# Patient Record
Sex: Female | Born: 1935
Health system: Southern US, Community
[De-identification: ages and names within clinical notes are randomized; demographics above are authoritative.]

## PROBLEM LIST (undated history)

## (undated) DIAGNOSIS — IMO0002 Reserved for concepts with insufficient information to code with codable children: Secondary | ICD-10-CM

## (undated) DIAGNOSIS — S62101A Fracture of unspecified carpal bone, right wrist, initial encounter for closed fracture: Secondary | ICD-10-CM

## (undated) DIAGNOSIS — K219 Gastro-esophageal reflux disease without esophagitis: Secondary | ICD-10-CM

## (undated) DIAGNOSIS — K279 Peptic ulcer, site unspecified, unspecified as acute or chronic, without hemorrhage or perforation: Secondary | ICD-10-CM

## (undated) DIAGNOSIS — M545 Low back pain, unspecified: Secondary | ICD-10-CM

## (undated) DIAGNOSIS — G4700 Insomnia, unspecified: Secondary | ICD-10-CM

## (undated) DIAGNOSIS — I499 Cardiac arrhythmia, unspecified: Secondary | ICD-10-CM

## (undated) DIAGNOSIS — G8929 Other chronic pain: Secondary | ICD-10-CM

## (undated) DIAGNOSIS — R0781 Pleurodynia: Secondary | ICD-10-CM

## (undated) DIAGNOSIS — W19XXXA Unspecified fall, initial encounter: Secondary | ICD-10-CM

## (undated) DIAGNOSIS — T7840XA Allergy, unspecified, initial encounter: Secondary | ICD-10-CM

## (undated) DIAGNOSIS — K2970 Gastritis, unspecified, without bleeding: Secondary | ICD-10-CM

## (undated) HISTORY — DX: Gastritis, unspecified, without bleeding: K29.70

## (undated) HISTORY — DX: Insomnia, unspecified: G47.00

## (undated) HISTORY — DX: Other chronic pain: G89.29

## (undated) HISTORY — DX: Fracture of unspecified carpal bone, right wrist, initial encounter for closed fracture: S62.101A

## (undated) HISTORY — DX: Allergy, unspecified, initial encounter: T78.40XA

## (undated) HISTORY — PX: NO PAST SURGERIES: SHX2092

## (undated) HISTORY — DX: Peptic ulcer, site unspecified, unspecified as acute or chronic, without hemorrhage or perforation: K27.9

## (undated) HISTORY — DX: Low back pain: M54.5

## (undated) HISTORY — DX: Low back pain, unspecified: M54.50

## (undated) HISTORY — DX: Pleurodynia: R07.81

## (undated) HISTORY — DX: Gastro-esophageal reflux disease without esophagitis: K21.9

---

## 2009-11-16 ENCOUNTER — Encounter: Payer: Self-pay | Admitting: Internal Medicine

## 2009-11-16 ENCOUNTER — Ambulatory Visit: Payer: Self-pay | Admitting: Internal Medicine

## 2009-11-16 DIAGNOSIS — G4700 Insomnia, unspecified: Secondary | ICD-10-CM | POA: Insufficient documentation

## 2009-11-16 DIAGNOSIS — F1190 Opioid use, unspecified, uncomplicated: Secondary | ICD-10-CM

## 2009-11-16 DIAGNOSIS — M79609 Pain in unspecified limb: Secondary | ICD-10-CM

## 2009-11-16 DIAGNOSIS — Z8711 Personal history of peptic ulcer disease: Secondary | ICD-10-CM | POA: Insufficient documentation

## 2009-11-23 ENCOUNTER — Encounter: Payer: Self-pay | Admitting: Internal Medicine

## 2009-11-30 ENCOUNTER — Ambulatory Visit: Payer: Self-pay | Admitting: Internal Medicine

## 2009-11-30 DIAGNOSIS — M542 Cervicalgia: Secondary | ICD-10-CM

## 2010-02-11 ENCOUNTER — Telehealth: Payer: Self-pay | Admitting: Internal Medicine

## 2010-02-23 ENCOUNTER — Ambulatory Visit: Payer: Self-pay | Admitting: Internal Medicine

## 2010-02-23 ENCOUNTER — Encounter: Payer: Self-pay | Admitting: Gastroenterology

## 2010-02-23 DIAGNOSIS — R079 Chest pain, unspecified: Secondary | ICD-10-CM

## 2010-02-23 DIAGNOSIS — M810 Age-related osteoporosis without current pathological fracture: Secondary | ICD-10-CM

## 2010-02-23 LAB — CONVERTED CEMR LAB
ALT: 12 units/L (ref 0–35)
AST: 16 units/L (ref 0–37)
Alkaline Phosphatase: 50 units/L (ref 39–117)
Basophils Absolute: 0 10*3/uL (ref 0.0–0.1)
Basophils Relative: 1 % (ref 0–1)
Creatinine, Ser: 0.88 mg/dL (ref 0.40–1.20)
Eosinophils Absolute: 0.1 10*3/uL (ref 0.0–0.7)
Eosinophils Relative: 2 % (ref 0–5)
HCT: 35.3 % — ABNORMAL LOW (ref 36.0–46.0)
Lymphocytes Relative: 43 % (ref 12–46)
MCHC: 32.6 g/dL (ref 30.0–36.0)
MCV: 91.7 fL (ref >=78.0)
Monocytes Absolute: 0.3 10*3/uL (ref 0.1–1.0)
Platelets: 213 10*3/uL (ref 150–400)
RDW: 14.2 % (ref 11.5–15.5)
Sodium: 143 meq/L (ref 135–145)
Total Bilirubin: 0.3 mg/dL (ref 0.3–1.2)
Total Protein: 7 g/dL (ref 6.0–8.3)

## 2010-04-09 ENCOUNTER — Ambulatory Visit: Payer: Self-pay | Admitting: Gastroenterology

## 2010-04-09 ENCOUNTER — Encounter (INDEPENDENT_AMBULATORY_CARE_PROVIDER_SITE_OTHER): Payer: Self-pay | Admitting: *Deleted

## 2010-04-09 DIAGNOSIS — K2970 Gastritis, unspecified, without bleeding: Secondary | ICD-10-CM

## 2010-05-18 NOTE — Assessment & Plan Note (Signed)
Vital Signs:  Patient profile:   75 year old female Height:      62 inches (157.48 cm) Weight:      111.0 pounds (50.45 kg) BMI:     20.38 Temp:     98.6 degrees F (37.00 degrees C) oral Pulse rate:   67 / minute BP sitting:   138 / 78  (right arm)  Vitals Entered By: Cynda Familia Duncan Dull) (November 16, 2009 8:49 AM) CC: bilateral leg pain mostly on right, mostly during the night/early morining off and on x 5-6 years, cant sleep it she doesnt take the amitriptyline 25mg , had stomach ulcers  Is Patient Diabetic? No Pain Assessment Patient in pain? no      Nutritional Status BMI of 25 - 29 = overweight  Have you ever been in a relationship where you felt threatened, hurt or afraid?Unable to ask   Does patient need assistance? Functional Status Self care Ambulation Normal   CC:  bilateral leg pain mostly on right, mostly during the night/early morining off and on x 5-6 years, cant sleep it she doesnt take the amitriptyline 25mg , and had stomach ulcers .  History of Present Illness: 75 yo female w/o significant PMH presents with a 5-6 yr history of chronic back pain that radiates down to her legs and independent bilateral legs pain that is worst on the right than left.  She describes the leg pain as tight without any radiation, numbness or tingling and it is worst in early morning which is relieved by Excedrine.  She has been taking 1-2 Excedrine in the past 5-6 years daily and was told by a physician in [place] that she has stomach ulcer. Patient does have occasional acid reflux after meals but currently does not have any symptoms and  is not taking any medication for that.  Patient has been taking Amytriptyline 25mg  1 tablet at bedtime for insomnia and appetite stimulant.    PSH: none FH: Not known SH: no alcohol, smoke, illicit drug MEDs: Excedrine, Amitriptyline 25mg    Preventive Screening-Counseling & Management  Alcohol-Tobacco     Smoking Status: never  Family  History: Not known  Social History: Smoking Status:  never  Review of Systems  The patient denies anorexia, fever, weight loss, weight gain, vision loss, decreased hearing, hoarseness, chest pain, syncope, dyspnea on exertion, peripheral edema, prolonged cough, headaches, hemoptysis, abdominal pain, melena, hematochezia, severe indigestion/heartburn, hematuria, incontinence, genital sores, muscle weakness, suspicious skin lesions, transient blindness, difficulty walking, depression, unusual weight change, abnormal bleeding, enlarged lymph nodes, angioedema, breast masses, and testicular masses.    Physical Exam  General:  alert, well-developed, well-nourished, and well-hydrated.   Head:  normocephalic and atraumatic.   Eyes:  vision grossly intact, pupils equal, pupils round, and pupils reactive to light.   Ears:  R ear normal, L ear normal, and no external deformities.   Nose:  no external deformity, no external erythema, and no nasal discharge.   Mouth:  good dentition, no gingival abnormalities, and pharynx pink and moist.   Neck:  supple, full ROM, and no masses.   Chest Wall:  no deformities, no tenderness, and no mass.   Breasts:  skin/areolae normal and no masses.   Lungs:  normal respiratory effort, no intercostal retractions, no accessory muscle use, normal breath sounds, no crackles, and no wheezes.   Heart:  normal rate, regular rhythm, no murmur, no gallop, no rub, and no JVD.   Abdomen:  soft, non-tender, normal bowel sounds, no  distention, no masses, and no rebound tenderness.   Msk:  normal ROM, no joint tenderness, no joint swelling, no joint warmth, no redness over joints, no joint instability, and no crepitation.  Negative straight leg raising test bilaterally   Pulses:  R radial normal.   Extremities:  no edema,clubbing,cyanosis Neurologic:  alert & oriented X3, cranial nerves II-XII intact, strength normal in all extremities, sensation intact to light touch, sensation  intact to pinprick, gait normal, and DTRs symmetrical and normal.   Skin:  turgor normal, color normal, no rashes, no ecchymoses, no petechiae, no purpura, no ulcerations, and no edema.   Cervical Nodes:  no anterior cervical adenopathy and no posterior cervical adenopathy.   Psych:  Oriented X3, memory intact for recent and remote, normally interactive, good eye contact, not anxious appearing, and not depressed appearing.     Impression & Recommendations:  Problem # 1:  LEG PAIN, BILATERAL (ICD-729.5) Assessment Deteriorated Pain and tightness is progressively getting worst per patient.  Patient has been taking daily Excedrine for pain relieve.  This could be due to muscle spasm.  Radiculopathy is less likely as she has a negative straight leg raising test bilaterally.  I will give her a trial of muscle relaxant-Flexeril 10mg  by mouth  daily for symptoms relief and stop Excedrine.  Problem # 2:  LOW BACK PAIN, CHRONIC (ICD-724.2) Assessment: Deteriorated  Patient has been seen at [facility] and we do not have any medical records.  Will send request for medical records and hold Xray of lumbar spine today until we get records from [facility].  Will try Flexeril 10mg  for symptoms relief.  Her updated medication list for this problem includes:    Cyclobenzaprine Hcl 10 Mg Tabs (Cyclobenzaprine hcl) .Marland Kitchen... 1/2 to 1 tablet at night for muscle spasm  Problem # 3:  INSOMNIA (ICD-780.52) Assessment: Unchanged Patient has a hard time falling asleep and requires Amitriptyline 25mg  at bedtime. Will refill Amitriptyline 25mg .      Problem # 4:  PERSONAL HISTORY OF PEPTIC ULCER DISEASE (ICD-V12.71) Assessment: Unchanged  Counsel on stopping Excedrine as it can worsen her ulcer.   Start Omeprozole 20mg  by mouth daily  Her updated medication list for this problem includes:    Omeprazole 20 Mg Cpdr (Omeprazole) .Marland Kitchen... 1 tablet by mouth daily  Complete Medication List: 1)   Cyclobenzaprine Hcl 10 Mg Tabs (Cyclobenzaprine hcl) .... 1/2 to 1 tablet at night for muscle spasm 2)  Omeprazole 20 Mg Cpdr (Omeprazole) .Marland Kitchen.. 1 tablet by mouth daily 3)  Amitriptyline Hcl 25 Mg Tabs (Amitriptyline hcl) .Marland Kitchen.. 1 tablet by mouth at bedtime  Patient Instructions: 1)  Follow-up appointment in 2-3 weeks with interpreter for full H&P 2)  1. Stop taking Excedrine 3)  2. Start Omeprazole for stomach ulcer 4)  3. Start Flexeril 10mg  1/2-1 tablet daily for leg pain/muscle spasm Prescriptions: AMITRIPTYLINE HCL 25 MG TABS (AMITRIPTYLINE HCL) 1 tablet by mouth at bedtime  #30 x 3   Entered and Authorized by:   Rosana Berger MD   Signed by:   Rosana Berger MD on 11/16/2009   Method used:   Print then Give to Patient   RxID:   [id number] OMEPRAZOLE 20 MG CPDR (OMEPRAZOLE) 1 tablet by mouth daily  #30 x 3   Entered and Authorized by:   Rosana Berger MD   Signed by:   Rosana Berger MD on 11/16/2009   Method used:   Print then Give to Patient   RxID:  [id number] CYCLOBENZAPRINE HCL 10 MG TABS (CYCLOBENZAPRINE HCL) 1/2 to 1 tablet at night for muscle spasm  #30 x 3   Entered by:   Zoila Shutter MD   Authorized by:   Rosana Berger MD   Signed by:   Rosana Berger MD on 11/16/2009   Method used:   Print then Give to Patient   RxID:   [id number]    Prevention & Chronic Care Immunizations   Influenza vaccine: Not documented    Tetanus booster: Not documented    Pneumococcal vaccine: Not documented    H. zoster vaccine: Not documented  Colorectal Screening   Hemoccult: Not documented    Colonoscopy: Not documented  Other Screening   Pap smear: Not documented    Mammogram: Not documented    DXA bone density scan: Not documented   Smoking status: never  (11/16/2009)  Lipids   Total Cholesterol: Not documented   LDL: Not documented   LDL Direct: Not documented   HDL: Not documented   Triglycerides: Not documented

## 2010-05-18 NOTE — Assessment & Plan Note (Signed)
Summary: EST-2 WEEK F/U VISIT/CH   Vital Signs:  Patient profile:   75 year old female Height:      62 inches (157.48 cm) Weight:      107.3 pounds (48.77 kg) BMI:     19.70 Temp:     97.8 degrees F oral Pulse rate:   77 / minute BP sitting:   113 / 71  (left arm)  Vitals Entered By: Chinita Pester RN (November 30, 2009 8:50 AM) CC: 2 week f/u. Left neck muscle stiffness. Pain med. helps leg pain. Lefgt ankle bruise. Is Patient Diabetic? No Pain Assessment Patient in pain? yes     Location: lower back Intensity: 8 Type: aching Onset of pain  Intermittent Nutritional Status BMI of 19 -24 = normal  Have you ever been in a relationship where you felt threatened, hurt or afraid?Unable to ask; daughter w/pt.   Does patient need assistance? Functional Status Self care Ambulation Normal Comments Translator w/pt.   Primary Care Provider:  Rosana Berger MD  CC:  2 week f/u. Left neck muscle stiffness. Pain med. helps leg pain. Lefgt ankle bruise.Marland Kitchen  History of Present Illness: 75 yo female presents for 2 week follow up.  She states that the Flexeril helps with her right leg pain.  She still takes Excedrine occasionally only when she takes a walk.   She also complains of bruising on her hand and left ankle, she does not have a history of easy bruising and did not know how she got those bruises.  She has left stiffness on her neck.  She has a burning sensation in her stomach around 5am.   Severe chronic back pain for years and has not had any Xrays or imaginngs.  She describes her pain as dull, stiff, 8/10, not moving around alleviates her pain and moving around makes it worse.   She went to chiropractor before but it did not help.   PMH:  stomach ulcer [id number] XB:[id number] Social:lives with her daughter, denies any alcohol, smoking, illicit drugs     Depression History:      The patient denies a depressed mood most of the day and a diminished interest in her usual daily  activities.         Preventive Screening-Counseling & Management  Alcohol-Tobacco     Alcohol drinks/day: 0     Smoking Status: never  Physical Exam  General:  alert, well-developed, well-nourished, and well-hydrated.   Neck:  tender to palpation on left side but no rigidity Lungs:  normal respiratory effort, no intercostal retractions, no accessory muscle use, normal breath sounds, no dullness, no crackles, and no wheezes.   Heart:  normal rate, regular rhythm, no murmur, no gallop, no rub, and no JVD.   Abdomen:  soft, non-tender, normal bowel sounds, and no distention.   Msk:  normal ROM, no joint tenderness, no joint swelling, no joint warmth, no redness over joints, and no joint deformities.   Extremities:  no edema, + bruises on left ankle and wrist Neurologic:  alert & oriented X3 and cranial nerves II-XII intact.     Impression & Recommendations:  Problem # 1:  LOW BACK PAIN, CHRONIC (ICD-724.2) She states that the pain has been chronic in the past 5-6 years and Excedrine and Aleve helps; however, she has a history of gastric ulcer.   She never had any imaging before. On physical examination, she does have mild kyphosis but no tenderness to palpation.   I would like to send  her for Xray of lumbar spine once she has her orange card.   I told her to stop taking Excedrine and she can start Tramadol 50mg  1 tablet every 6-8 hours as needed for pain  Her updated medication list for this problem includes:    Cyclobenzaprine Hcl 10 Mg Tabs (Cyclobenzaprine hcl) .Marland Kitchen... 1/2 to 1 tablet at night for muscle spasm    Tramadol Hcl 50 Mg Tabs (Tramadol hcl) .Marland Kitchen... 1 tablet by mouth 6-8 hours as needed for pain  Problem # 2:  PERSONAL HISTORY OF PEPTIC ULCER DISEASE (ICD-V12.71) Assessment: Unchanged Patient had and EGD in [place] last year and was found to have gastric ulcer (per patient).  She still has burning sensation in early morning.  I will increase her Omeprazole 40mg  daily. I  explained to her the risk of taking Excedrine given that she had/has gastric ulcer.  I told her to stop taking Excedrine.   Once she gets her orange card, I will send her to GI to make sure that her ulcer(s) has healed.    Problem # 3:  INSOMNIA (ICD-780.52) Assessment: Improved Will continue Amitriptyline because this is helping her sleep at night as well as increase her appetite  Problem # 4:  LEG PAIN, BILATERAL (ICD-729.5) Assessment: Improved Due to muscle spasm.  Improving with Flexeril.  WIll continue medication  Problem # 5:  NECK PAIN, LEFT (ICD-723.1) Assessment: New She has tenderness to palpation on left side of neck but no rigidity.  Most likely muscle spasm.   I will continue Flexeril 10mg .  Her updated medication list for this problem includes:    Cyclobenzaprine Hcl 10 Mg Tabs (Cyclobenzaprine hcl) .Marland Kitchen... 1/2 to 1 tablet at night for muscle spasm    Tramadol Hcl 50 Mg Tabs (Tramadol hcl) .Marland Kitchen... 1 tablet by mouth 6-8 hours as needed for pain  Complete Medication List: 1)  Cyclobenzaprine Hcl 10 Mg Tabs (Cyclobenzaprine hcl) .... 1/2 to 1 tablet at night for muscle spasm 2)  Omeprazole 40 Mg Cpdr (omeprazole)  .Marland Kitchen.. 1 tablet by mouth daily 3)  Amitriptyline Hcl 25 Mg Tabs (Amitriptyline hcl) .Marland Kitchen.. 1 tablet by mouth at bedtime 4)  Tramadol Hcl 50 Mg Tabs (Tramadol hcl) .Marland Kitchen.. 1 tablet by mouth 6-8 hours as needed for pain  Patient Instructions: 1)  1. DO NOT take any excedrine because that can worsen your stomach ulcer 2)  2. Take Tramadol 50 mg 1 tablet every 6-8 hours as needed for back pain 3)  3. Increase your Omeprazole to 40mg  1 tablet daily 4)  4. Continue taking Flexeril 10mg  1/2  to 1 tablet daily for leg and neck pain 5)  5. Follow up in 3-4 months with Dr. Anselm Jungling Prescriptions: OMEPRAZOLE 40 MG CPDR (OMEPRAZOLE) 1 tablet by mouth daily  #30 x 3   Entered and Authorized by:   Rosana Berger MD   Signed by:   Rosana Berger MD on 11/30/2009   Method used:   Handwritten    RxID:   [id number] TRAMADOL HCL 50 MG TABS (TRAMADOL HCL) 1 tablet by mouth 6-8 hours as needed for pain  #90 x 1   Entered and Authorized by:   Rosana Berger MD   Signed by:   Rosana Berger MD on 11/30/2009   Method used:   Handwritten   RxID:   [id number]    Prevention & Chronic Care Immunizations   Influenza vaccine: Not documented   Influenza vaccine due: 12/17/2009    Tetanus booster: Not documented  Tetanus booster due: 11/30/2009    Pneumococcal vaccine: Not documented    H. zoster vaccine: Not documented    Immunization comments: Had Pneumovax vaccination 5 years ago  Colorectal Screening   Hemoccult: Not documented    Colonoscopy: Not documented  Other Screening   Pap smear: Not documented   Pap smear due: 12/01/2010    Mammogram: Not documented   Mammogram due: 12/01/2010    DXA bone density scan: Not documented   DXA scan due: 12/01/2010    Smoking status: never  (11/30/2009)    Screening comments: She had it all done in [place] last year.  Never had a colonoscopy before  Lipids   Total Cholesterol: Not documented   LDL: Not documented   LDL Direct: Not documented   HDL: Not documented   Triglycerides: Not documented   Appended Document: EST-2 WEEK F/U VISIT/CH Office visit level of service was 5 because of length of time taken for her mutilple complaints and all of her history was obtained through an interpreter and her daughter.

## 2010-05-18 NOTE — Miscellaneous (Signed)
Summary: PATIENT CONSENT FORM  PATIENT CONSENT FORM   Imported By: Louretta Parma 11/18/2009 15:57:52  _____________________________________________________________________  External Attachment:    Type:   Image     Comment:   External Document

## 2010-05-18 NOTE — Progress Notes (Signed)
Summary: medication/gp  Phone Note Call from Patient   Caller: Daughter Summary of Call: Pt.'s daughter called and stated Flexeril 10mg  is not helping with the muscle spasm.  She wants to know if you could  increase the strength or change to something else. Uses [facility] on [address]. Thanks Initial call taken by: Chinita Pester RN,  February 11, 2010 9:35 AM  Follow-up for Phone Call        daughter called again Follow-up by: Marin Roberts RN,  February 11, 2010 5:10 PM  Additional Follow-up for Phone Call Additional follow up Details #1::        Is pt taking tramadol as well to help with pain?  May increase to three times a day as needed spasm. However, she should be aware that this may cause sedation and avoid using that frequently if so.  Pt should probably be re-evaluated if that's the case. Additional Follow-up by: Mariea Stable MD,  February 12, 2010 10:19 AM    Additional Follow-up for Phone Call Additional follow up Details #2::    I talked to pt.'s daughter;informed her the pt. may take Flexeril up to 3 times a day per Dr. Onalee Hua. She stated she understood; said she might try 1/2 tablet  Follow-up by: Chinita Pester RN,  February 12, 2010 2:04 PM

## 2010-05-18 NOTE — Miscellaneous (Signed)
Summary: RELEASE OF INFORMATION FORM  RELEASE OF INFORMATION FORM   Imported By: Louretta Parma 11/26/2009 16:41:16  _____________________________________________________________________  External Attachment:    Type:   Image     Comment:   External Document

## 2010-05-18 NOTE — Assessment & Plan Note (Signed)
Summary: LEG PAIN BODY PAIN/ NEED MEDICATION FOR PAIN/SB.   Vital Signs:  Patient profile:   75 year old female Height:      62 inches (157.48 cm) Weight:      111.9 pounds (50.86 kg) BMI:     20.54 Temp:     97.4 degrees F (36.33 degrees C) oral Pulse rate:   77 / minute BP sitting:   130 / 75  (right arm)  Vitals Entered By: Stanton Kidney Ditzler RN (February 23, 2010 9:00 AM) Is Patient Diabetic? No Pain Assessment Patient in pain? yes     Location: back, ribs and stomach Intensity: 8-9 Type: sharp Onset of pain  long time Nutritional Status BMI of 19 -24 = normal Nutritional Status Detail appetite ok  Have you ever been in a relationship where you felt threatened, hurt or afraid?denies   Does patient need assistance? Functional Status Self care Ambulation Normal Comments Daughter and Lyda Kalata interpreter with pt. Pain occ right and left rib cage area for over 1 year. FU right calf spasms - better with med - area becomes cold with spasms - no discoloration.   Primary Care Provider:  Rosana Berger MD   History of Present Illness: 75 y/o [place] woman with PMH significant for chronic back pain for 5-6 years, leg cramps for 1-2 years , personal h/o gastric ulcer comes to the clinic for worsening leg cramps.  She describes the leg cramps as they would occur mostly at nights, sometimes in so much pain that she has to take excedrine (though it is not recommended).She says that flexeril at the dose she was (1/2 to 1 tab was not helping her). She could not tell the exact duration for how long do they last.   She also complains of rib pains that has been ongoing for 1-2 years , worse recently. The pain is located on both sides. She describes the pain as sharp, worse being 8/10.She has this pain about 3- 4 times in a week,lasting for few hours and gets better with pain meds.She also denies c/p, SOB,fever, N/V/D.  She also complains of epigastric pain that is described as reflux, gets better  with eating but omeprazole is not helping her.  The daughter also got some medicines that were given by a physician from [place] that included risedronate, showing that she has some osteoporosis. She also mentioned that her mother is also taking calcium and vit D supplements.   Depression History:      The patient denies a depressed mood most of the day and a diminished interest in her usual daily activities.         Preventive Screening-Counseling & Management  Alcohol-Tobacco     Alcohol drinks/day: 0     Smoking Status: never  Current Medications (verified): 1)  Cyclobenzaprine Hcl 10 Mg Tabs (Cyclobenzaprine Hcl) .... Take 1/2 Tablet Three Times A Day 2)  Omeprazole 40 Mg Cpdr (Omeprazole) .Marland Kitchen.. 1 Tablet By Mouth Daily 3)  Amitriptyline Hcl 25 Mg Tabs (Amitriptyline Hcl) .Marland Kitchen.. 1 Tablet By Mouth At Bedtime 4)  Tramadol Hcl 50 Mg Tabs (Tramadol Hcl) .Marland Kitchen.. 1 Tablet By Mouth 6-8 Hours As Needed For Pain  Allergies (verified): No Known Drug Allergies  Review of Systems      See HPI  The patient denies anorexia, fever, vision loss, decreased hearing, chest pain, dyspnea on exertion, and prolonged cough.    Physical Exam  General:  alert, well-developed, well-nourished, and well-hydrated.   Head:  normocephalic and  atraumatic.   Eyes:  vision grossly intact, pupils equal, pupils round, and pupils reactive to light.   Mouth:  pharynx pink and moist.   Neck:  supple, full ROM, and no masses.   Lungs:  normal respiratory effort, no intercostal retractions, no accessory muscle use, and normal breath sounds.   Abdomen:  soft, non-tender, normal bowel sounds, no distention, no masses, no guarding, no rigidity, and no rebound tenderness.   Msk:  normal ROM, no joint tenderness, no joint swelling, no joint warmth, no redness over joints, and no joint deformities.  SLRT was negative. Pulses:  2+pulses b/l. Extremities:  no clubbing or cyanosis. Neurologic:  alert & oriented X3, cranial nerves  II-XII intact, strength normal in all extremities, sensation intact to light touch, and DTRs symmetrical and normal.     Impression & Recommendations:  Problem # 1:  PERSONAL HISTORY OF PEPTIC ULCER DISEASE (ICD-V12.71) Assessment Unchanged She reports that she continues to have epigastric pain without any relief from omeprazole. As per the patient's daughter she had an EGD about 2 years ago in [place] when they went for a visit, and she was diagnosed with ulcer. No records are available. She was advised to get an EGD here and referral was made. Will follow up with referral. Orders: Gastroenterology Referral (GI)  Problem # 2:  LEG PAIN, BILATERAL (ICD-729.5) Assessment: Unchanged Most likely she has nocturnal leg cramps, etiology not known. Other possiblities like hypokalemia vs hypocalcemia were considered and we ordered CMP for that. She was advised to avoid lot of leg movements while sleeping especially twisting her toes, was adviced some stretching exercises and keeping the bed end loose and not tucked up. Her flexeril dose was also increased to 1/2 tab three times a day that would also help her alleviating symptoms to some extent.  Orders: T-Comprehensive Metabolic Panel [id number] T-CBC w/Diff ([id number])  Problem # 3:  LOW BACK PAIN, CHRONIC (ICD-724.2) Assessment: Unchanged Back pain is controlled with the current pain regimen.Will continue the same. Given a chronic history we decided not to get a lumbar spine x-ray during this visit but if the pain gets worse we can consider that during later visits.  Her updated medication list for this problem includes:    Cyclobenzaprine Hcl 10 Mg Tabs (Cyclobenzaprine hcl) .Marland Kitchen... Take 1/2 tablet three times a day    Tramadol Hcl 50 Mg Tabs (Tramadol hcl) .Marland Kitchen... 1 tablet by mouth 6-8 hours as needed for pain  Problem # 4:  RIB PAIN (ICD-786.50) Assessment: New She reports lower chest pain that is loacted b/l around the lower ribs, occurs  about 3-4 times in a week, gets relieved with pain meds. But she denies any trauma,new onset cough, worsening of chest pain with breathing, fever, malaise.There is no tenderness to palpation. Differentials include muscle spasms vs trauma(no h/o) vs pleuritic vs metastatic.This is most likely muscle spasms , absence of other symptoms like weight loss,fever, SOB makes metastatic and pleuritic very less likey.Would treat with tramadol.  Complete Medication List: 1)  Cyclobenzaprine Hcl 10 Mg Tabs (Cyclobenzaprine hcl) .... Take 1/2 tablet three times a day 2)  Omeprazole 40 Mg Cpdr (omeprazole)  .Marland Kitchen.. 1 tablet by mouth daily 3)  Amitriptyline Hcl 25 Mg Tabs (Amitriptyline hcl) .Marland Kitchen.. 1 tablet by mouth at bedtime 4)  Tramadol Hcl 50 Mg Tabs (Tramadol hcl) .Marland Kitchen.. 1 tablet by mouth 6-8 hours as needed for pain  Patient Instructions: 1)  Please take the medicines as prescribed. 2)  Please schedule a  follow-up appointment in 2 months. Prescriptions: TRAMADOL HCL 50 MG TABS (TRAMADOL HCL) 1 tablet by mouth 6-8 hours as needed for pain  #90 x 1   Entered and Authorized by:   Elyse Jarvis   Signed by:   Elyse Jarvis on 02/23/2010   Method used:   Print then Give to Patient   RxID:   [id number] AMITRIPTYLINE HCL 25 MG TABS (AMITRIPTYLINE HCL) 1 tablet by mouth at bedtime  #30 x 3   Entered and Authorized by:   Elyse Jarvis   Signed by:   Elyse Jarvis on 02/23/2010   Method used:   Print then Give to Patient   RxID:   [id number] OMEPRAZOLE 40 MG CPDR (OMEPRAZOLE) 1 tablet by mouth daily  #30 x 3   Entered and Authorized by:   Elyse Jarvis   Signed by:   Elyse Jarvis on 02/23/2010   Method used:   Print then Give to Patient   RxID:   [id number] CYCLOBENZAPRINE HCL 10 MG TABS (CYCLOBENZAPRINE HCL) take 1/2 tablet three times a day  #90 x 1   Entered and Authorized by:   Elyse Jarvis   Signed by:   Elyse Jarvis on 02/23/2010   Method used:   Print then Give to Patient   RxID:    [id number]    Orders Added: 1)  Est. Patient Level V [63875] 2)  T-Comprehensive Metabolic Panel [[id number]] 3)  T-CBC w/Diff [[id number]] 4)  Gastroenterology Referral [GI]    Prevention & Chronic Care Immunizations   Influenza vaccine: Not documented   Influenza vaccine due: 12/17/2009    Tetanus booster: Not documented   Td booster deferral: Deferred  (02/23/2010)   Tetanus booster due: 11/30/2009    Pneumococcal vaccine: Not documented   Pneumococcal vaccine deferral: Deferred  (02/23/2010)    H. zoster vaccine: Not documented  Colorectal Screening   Hemoccult: Not documented    Colonoscopy: Not documented  Other Screening   Pap smear: Not documented   Pap smear due: 12/01/2010    Mammogram: Not documented   Mammogram due: 12/01/2010    DXA bone density scan: Not documented   DXA scan due: 12/01/2010    Smoking status: never  (02/23/2010)  Lipids   Total Cholesterol: Not documented   LDL: Not documented   LDL Direct: Not documented   HDL: Not documented   Triglycerides: Not documented      Resource handout printed.  Process Orders Check Orders Results:     [facility]: ABN not required for this insurance Tests Sent for requisitioning (February 25, 2010 8:38 AM):     02/23/2010: [facility] -- T-Comprehensive Metabolic Panel [[id number]] (signed)     02/23/2010: [facility] -- [facility] w/Diff [[id number]] (signed)

## 2010-05-18 NOTE — Letter (Signed)
Summary: [facility]  [facility]   Imported By: Louretta Parma 11/30/2009 12:26:55  _____________________________________________________________________  External Attachment:    Type:   Image     Comment:   External Document

## 2010-05-18 NOTE — Letter (Signed)
Summary: New Patient letter  [facility]  [address] [place], [place] [address]   Phone: [phone]  Fax: [phone]       02/23/2010 MRN: [id number]  [facility] [place] [address] [place], [place]  [address]  Dear Ms. Allison Rojas,  Welcome to the Gastroenterology Division at [facility].    You are scheduled to see Dr.  [name] on 04-09-10 at 2:30pm on the 3rd floor at [facility], [address]  We ask that you try to arrive at our office 15 minutes prior to your appointment time to allow for check-in.  We would like you to complete the enclosed self-administered evaluation form prior to your visit and bring it with you on the day of your appointment.  We will review it with you.  Also, please bring a complete list of all your medications or, if you prefer, bring the medication bottles and we will list them.  Please bring your insurance card so that we may make a copy of it.  If your insurance requires a referral to see a specialist, please bring your referral form from your primary care physician.  Co-payments are due at the time of your visit and may be paid by cash, check or credit card.     Your office visit will consist of a consult with your physician (includes a physical exam), any laboratory testing he/she may order, scheduling of any necessary diagnostic testing (e.g. x-ray, ultrasound, CT-scan), and scheduling of a procedure (e.g. Endoscopy, Colonoscopy) if required.  Please allow enough time on your schedule to allow for any/all of these possibilities.    If you cannot keep your appointment, please call [phone] to cancel or reschedule prior to your appointment date.  This allows [place] the opportunity to schedule an appointment for another patient in need of care.  If you do not cancel or reschedule by 5 p.m. the business day prior to your appointment date, you will be charged a $50.00 late cancellation/no-show fee.    Thank you for choosing [facility] for your medical needs.  We appreciate the opportunity to care for you.  Please visit [place] at our website  to learn more about our practice.                     Sincerely,                                                             The Gastroenterology Division

## 2010-05-20 NOTE — Letter (Signed)
Summary: EGD Instructions  [facility]  [address] [place], [place] [address]   Phone: [phone]  Fax: [phone]       Allison Rojas    1935-12-08    MRN: [id number]       Procedure Day /Date:05/25/10 TUE     Arrival Time: 8 am     Procedure Time:9 am     Location of Procedure:                    [facility] (4th Floor)    PREPARATION FOR ENDOSCOPY   On 05/25/10 THE DAY OF THE PROCEDURE:  1.   No solid foods, milk or milk products are allowed after midnight the night before your procedure.  2.   Do not drink anything colored red or purple.  Avoid juices with pulp.  No orange juice.  3.  You may drink clear liquids until 7 am , which is 2 hours before your procedure.                                                                                                CLEAR LIQUIDS INCLUDE: Water Jello Ice Popsicles Tea (sugar ok, no milk/cream) Powdered fruit flavored drinks Coffee (sugar ok, no milk/cream) Gatorade Juice: apple, white grape, white cranberry  Lemonade Clear bullion, consomm, broth Carbonated beverages (any kind) Strained chicken noodle soup Hard Candy   MEDICATION INSTRUCTIONS  Unless otherwise instructed, you should take regular prescription medications with a small sip of water as early as possible the morning of your procedure.               OTHER INSTRUCTIONS  You will need a responsible adult at least 73 years of age to accompany you and drive you home.   This person must remain in the waiting room during your procedure.  Wear loose fitting clothing that is easily removed.  Leave jewelry and other valuables at home.  However, you may wish to bring a book to read or an iPod/MP3 player to listen to music as you wait for your procedure to start.  Remove all body piercing jewelry and leave at home.  Total time from sign-in until discharge is approximately 2-3 hours.  You should go home directly after your  procedure and rest.  You can resume normal activities the day after your procedure.  The day of your procedure you should not:   Drive   Make legal decisions   Operate machinery   Drink alcohol   Return to work  You will receive specific instructions about eating, activities and medications before you leave.    The above instructions have been reviewed and explained to me by   _______________________    I fully understand and can verbalize these instructions _____________________________ Date _________

## 2010-05-20 NOTE — Assessment & Plan Note (Signed)
History of Present Illness Visit Type: Initial Consult Primary GI MD: Rob Bunting MD Primary Otniel Hoe: Rosana Berger MD Requesting Taggert Bozzi: Leonette Most Rodgers,MD Chief Complaint: Screening for ENDO History of Present Illness:     very pleasant 75 year old woman who speaks no [place]. She was born in [place]. She was here with her daughter and her granddaughter who help as translators.  she was recommended to have an EGD by a doctor.  She had an EGD 1.5 years ago in [place] (showed "chronic atrophic gastritis and an ulcer" per patients family).  Has been on PPI for 4 months.  She was having dyspepsia.  Also when brushing her teeth she noticed coughin up mucous.  She still has pains in the morning usually.  Acid tast in mouth.  No vomiting.  FAmily thinks she has lost weight, but not clear how much.  She takes omeprazole two pills, usually before breakfast.  she had a CBC about a month ago, her hemoglobin was 11.8.           Current Medications (verified): 1)  Cyclobenzaprine Hcl 10 Mg Tabs (Cyclobenzaprine Hcl) .... Take 1/2 Tablet Three Times A Day 2)  Omeprazole 40 Mg Cpdr (Omeprazole) .Marland Kitchen.. 1 Tablet By Mouth Daily 3)  Amitriptyline Hcl 25 Mg Tabs (Amitriptyline Hcl) .Marland Kitchen.. 1 Tablet By Mouth At Bedtime 4)  Tramadol Hcl 50 Mg Tabs (Tramadol Hcl) .Marland Kitchen.. 1 Tablet By Mouth 6-8 Hours As Needed For Pain  Allergies (verified): No Known Drug Allergies  Past History:  Past Medical History: gastric ulcer while in [place]  Past Surgical History: none  Family History: Not known  Social History: she is widowed, she has 2 children, she does not smoke cigarettes or drink alcohol.  Review of Systems       Pertinent positive and negative review of systems were noted in the above HPI and GI specific review of systems.  All other review of systems was otherwise negative.   Vital Signs:  Patient profile:   75 year old female Height:      62 inches Weight:      113 pounds BMI:      20.74 BSA:     1.50 Pulse rate:   88 / minute Pulse rhythm:   regular BP sitting:   82 / 52  (left arm)  Vitals Entered By: Merri Ray CMA Duncan Dull) (April 09, 2010 2:09 PM)  Physical Exam  Additional Exam:  Constitutional: generally well appearing Psychiatric: alert and oriented times 3 Eyes: extraocular movements intact Mouth: oropharynx moist, no lesions Neck: supple, no lymphadenopathy Cardiovascular: heart regular rate and rythm Lungs: CTA bilaterally Abdomen: soft, non-tender, non-distended, no obvious ascites, no peritoneal signs, normal bowel sounds Extremities: no lower extremity edema bilaterally Skin: no lesions on visible extremities    Impression & Recommendations:  Problem # 1:  Gastritis, history of gastric ulcer there is a significant language barrier here even with her translators. She was taking a lot of Excedrin up until about 4 months ago. She had some type of gastric ulcer noted a year and a half ago in [place]. I think we should at least follow this up with an EGD to check for ulcer healing. Biopsy her for H. pylori. The meantime she will stay on omeprazole daily. I've advised her to stay away from NSAIDs.  Patient Instructions: 1)  You will be scheduled to have an upper endoscopy. 2)  Try not to take NSAIDs. 3)  Tylenol is safe for your stomach. 4)  The  medication list was reviewed and reconciled.  All changed / newly prescribed medications were explained.  A complete medication list was provided to the patient / caregiver.  Appended Document: Orders Update/EGD interpretor scheduled   Clinical Lists Changes  Problems: Added new problem of GASTRITIS (ICD-535.50) Orders: Added new Test order of EGD (EGD) - Signed

## 2010-05-25 ENCOUNTER — Ambulatory Visit (INDEPENDENT_AMBULATORY_CARE_PROVIDER_SITE_OTHER): Payer: Self-pay | Admitting: Ophthalmology

## 2010-05-25 ENCOUNTER — Encounter: Payer: Self-pay | Admitting: Ophthalmology

## 2010-05-25 ENCOUNTER — Ambulatory Visit (AMBULATORY_SURGERY_CENTER): Payer: Self-pay | Admitting: Gastroenterology

## 2010-05-25 ENCOUNTER — Other Ambulatory Visit: Payer: Self-pay | Admitting: Internal Medicine

## 2010-05-25 ENCOUNTER — Other Ambulatory Visit: Payer: Self-pay

## 2010-05-25 ENCOUNTER — Other Ambulatory Visit: Payer: Self-pay | Admitting: *Deleted

## 2010-05-25 ENCOUNTER — Other Ambulatory Visit: Payer: Self-pay | Admitting: Gastroenterology

## 2010-05-25 DIAGNOSIS — M545 Low back pain, unspecified: Secondary | ICD-10-CM

## 2010-05-25 DIAGNOSIS — Z8711 Personal history of peptic ulcer disease: Secondary | ICD-10-CM

## 2010-05-25 DIAGNOSIS — K2990 Gastroduodenitis, unspecified, without bleeding: Secondary | ICD-10-CM

## 2010-05-25 DIAGNOSIS — Z9889 Other specified postprocedural states: Secondary | ICD-10-CM

## 2010-05-25 DIAGNOSIS — D649 Anemia, unspecified: Secondary | ICD-10-CM

## 2010-05-25 DIAGNOSIS — K2970 Gastritis, unspecified, without bleeding: Secondary | ICD-10-CM

## 2010-05-25 DIAGNOSIS — I499 Cardiac arrhythmia, unspecified: Secondary | ICD-10-CM

## 2010-05-25 DIAGNOSIS — Z0000 Encounter for general adult medical examination without abnormal findings: Secondary | ICD-10-CM | POA: Insufficient documentation

## 2010-05-25 DIAGNOSIS — E041 Nontoxic single thyroid nodule: Secondary | ICD-10-CM

## 2010-05-25 DIAGNOSIS — R928 Other abnormal and inconclusive findings on diagnostic imaging of breast: Secondary | ICD-10-CM

## 2010-05-25 DIAGNOSIS — D509 Iron deficiency anemia, unspecified: Secondary | ICD-10-CM | POA: Insufficient documentation

## 2010-05-25 DIAGNOSIS — R921 Mammographic calcification found on diagnostic imaging of breast: Secondary | ICD-10-CM | POA: Insufficient documentation

## 2010-05-25 DIAGNOSIS — M810 Age-related osteoporosis without current pathological fracture: Secondary | ICD-10-CM

## 2010-05-25 LAB — CBC
HCT: 34.8 % — ABNORMAL LOW (ref 36.0–46.0)
Hemoglobin: 11.4 g/dL — ABNORMAL LOW (ref 12.0–15.0)
MCH: 29.2 pg (ref 26.0–34.0)
MCHC: 32.8 g/dL (ref 30.0–36.0)
MCV: 89.2 fL (ref 78.0–100.0)
Platelets: 213 10*3/uL (ref 150–400)
RBC: 3.9 MIL/uL (ref 3.87–5.11)
RDW: 13.2 % (ref 11.5–15.5)
WBC: 5.2 10*3/uL (ref 4.0–10.5)

## 2010-05-25 LAB — FERRITIN: Ferritin: 127 ng/mL (ref 10–291)

## 2010-05-25 LAB — IRON AND TIBC: Iron: 103 ug/dL (ref 42–145)

## 2010-05-25 MED ORDER — TRAMADOL HCL 50 MG PO TABS: 50.0000 mg | ORAL_TABLET | Freq: Four times a day (QID) | ORAL | Status: DC | PRN

## 2010-05-25 MED ORDER — AMITRIPTYLINE HCL 25 MG PO TABS: 25.0000 mg | ORAL_TABLET | Freq: Every day | ORAL | Status: DC

## 2010-05-25 MED ORDER — CYCLOBENZAPRINE HCL 10 MG PO TABS: 5.0000 mg | ORAL_TABLET | Freq: Three times a day (TID) | ORAL | Status: DC

## 2010-05-25 MED ORDER — OMEPRAZOLE 40 MG PO CPDR: 40.0000 mg | DELAYED_RELEASE_CAPSULE | Freq: Every day | ORAL | Status: DC

## 2010-05-25 MED ORDER — TRAMADOL HCL 50 MG PO TABS
50.0000 mg | ORAL_TABLET | Freq: Four times a day (QID) | ORAL | Status: DC | PRN
Start: 2010-05-25 — End: 2010-12-23

## 2010-05-25 NOTE — Assessment & Plan Note (Addendum)
The patient's heart rate was reportedly irregular on the EKG performed in [place] back in June of 2010. I'm concerned about possible paroxysmal atrial fibrillation. The patient was not started on any medications at that time. The patient's current cardiac exam demonstrates regular rate and rhythm without any murmurs. But I will repeat an EKG for baseline purposes now.  The EKG today demonstrated normal sinus rhythm, with a heart rate of 69 and a normal axis.

## 2010-05-25 NOTE — Telephone Encounter (Signed)
Refill approved - nurse to phone in.

## 2010-05-25 NOTE — Progress Notes (Signed)
  Subjective:    Patient ID: Allison Rojas, female    DOB: 1935-11-02, 75 y.o.   MRN: [id number]  HPI  This is a 75 year old female with a past medical history significant for chronic back pain, osteoporosis and insomnia who presents for routine followup. The patient was last seen in our clinic on February 23, 2010. At that time, the patient continued to complain of epigastric pain and was referred for EGD, as she had no relief from her PPI. The endoscopy performed by Dr. [name] on February 7 demonstrated moderate gastritis and biopsies were taken to check for H. Pylori. Since the patient's last visit, she's continued to have mild to moderate epigastric abdominal pain. This is stable and unchanged. She's now been on a PPI since November of last year with some improvement since her initial presentation. The patient denies any nausea vomiting hematemesis change in her bowel movements or blood in her stool. The patient continues to have lumbar back pain, but this is chronic in nature and unchanged. There currently are  no warning signs for infection or malignancy. The patient brought in her medical records from [place], and the patient reportedly had a thyroid ultrasound performed which demonstrated a colloid cyst which needs repeat scanning now. The patient also had a DEXA scan performed which demonstrated a T score of -2.8. The patient is currently taking calcium and vitamin D for this. The patient's interpreter also states that she had a mammogram performed in June 2010 which demonstrated calcification of a blood vessel with no recommendation on followup and no BI-RADS level. EKG at the time reportedly demonstrated an arrhythmia but there is no record of that type and the patient was not started on any treatment. The patient has no other complaints.  Review of Systems  Constitutional: Negative for fever and chills.  Respiratory: Negative for cough and shortness of breath.   Cardiovascular: Negative for chest  pain and palpitations.  Gastrointestinal: Negative for vomiting, diarrhea and constipation.       Objective:   Physical Exam  Constitutional: She appears well-developed and well-nourished.  HENT:  Head: Normocephalic and atraumatic.  Eyes: Pupils are equal, round, and reactive to light.  Cardiovascular: Normal rate, regular rhythm and intact distal pulses.  Exam reveals no gallop and no friction rub.   No murmur heard. Pulmonary/Chest: Effort normal and breath sounds normal. She has no wheezes. She has no rales.  Abdominal: Soft. Bowel sounds are normal. She exhibits no distension. There is no tenderness.       There is mild tenderness over the right lower two ribs.   Musculoskeletal: Normal range of motion.  Neurological: She is alert. No cranial nerve deficit.  Skin: No rash noted.          Assessment & Plan:

## 2010-05-25 NOTE — Assessment & Plan Note (Signed)
The patient is currently taking over-the-counter calcium and vitamin D. I would not recommend a bisphosphonate at this time because of the patient's gastritis. This however could be considered in the future given the patient's T score on DEXA scan.

## 2010-05-25 NOTE — Assessment & Plan Note (Signed)
The patient's hemoglobin was 11.5 in November of 2011.  This was normocytic. I will recheck both his CBC and an anemia panel today to further work this out, though it's possible that this could be secondary the patient's moderate gastritis. Currently the patient denies any melena, black stools, or blood in stool.

## 2010-05-25 NOTE — Assessment & Plan Note (Signed)
The patient reportedly had a colloid cyst in her thyroid in June 2010. They recommended 6 month followup at that time. As such I will repeat a thyroid ultrasound now, for further evaluation.

## 2010-05-25 NOTE — Assessment & Plan Note (Signed)
This is stable and chronic, and the patient denies any recent fevers or chills. The pain is unchanged and is improved by her tramadol. I will continue the patient on this medication for now but lumbar x-ray could be considered in the future if the patient's pain continues.

## 2010-05-25 NOTE — Assessment & Plan Note (Signed)
The patient reportedly had calcifications on her mammogram performed in June 2010 but no BI-RADS level was assigned. I will repeat her mammogram now for followup of this lesion.

## 2010-05-25 NOTE — Patient Instructions (Addendum)
I am referring you for an thyroid ultrasound, a colonoscopy, and a mammogram today.  Please have the studies done.  You EKG looks normal.  I would like to see you back in 1-2 months to follow up on your abdominal pain.

## 2010-05-25 NOTE — Telephone Encounter (Signed)
Last refill 03/09/2010

## 2010-05-25 NOTE — Assessment & Plan Note (Signed)
EGD today demonstrated moderate gastritis and biopsies were taken. I will plan to treat the patient accordingly if H. pylori is found. For now continue conservative measurements with tramadol. This appears to be improving mildly but the patient's epigastric pain continues to be present. I have the patient followup in one month to ensure improvement of her symptoms.

## 2010-05-25 NOTE — Assessment & Plan Note (Signed)
Given the patient's anemia, and the fact that she is due for colonoscopy, I will refer the patient for screening colonoscopy at this time. The patient is not aware of any abnormal colonoscopies in the past.

## 2010-05-25 NOTE — Telephone Encounter (Signed)
Refill faxed in.  

## 2010-05-26 ENCOUNTER — Encounter: Payer: Self-pay | Admitting: Internal Medicine

## 2010-05-31 ENCOUNTER — Encounter: Payer: Self-pay | Admitting: Gastroenterology

## 2010-05-31 ENCOUNTER — Other Ambulatory Visit: Payer: Self-pay | Admitting: Internal Medicine

## 2010-05-31 DIAGNOSIS — Z1231 Encounter for screening mammogram for malignant neoplasm of breast: Secondary | ICD-10-CM

## 2010-06-08 ENCOUNTER — Ambulatory Visit (HOSPITAL_COMMUNITY)
Admission: RE | Admit: 2010-06-08 | Discharge: 2010-06-08 | Disposition: A | Discharge status: Home / Self Care | Payer: Self-pay | Source: Ambulatory Visit | Attending: Internal Medicine | Admitting: Internal Medicine

## 2010-06-08 ENCOUNTER — Encounter: Payer: Self-pay | Admitting: Ophthalmology

## 2010-06-08 DIAGNOSIS — E041 Nontoxic single thyroid nodule: Secondary | ICD-10-CM

## 2010-06-08 DIAGNOSIS — E049 Nontoxic goiter, unspecified: Secondary | ICD-10-CM | POA: Insufficient documentation

## 2010-06-08 NOTE — Progress Notes (Signed)
Quick Note:  It would appear that the results of the patient's thyroid ultrasound are benign, and I do not feel that FNA is needed it at this time. I would recommend checking a TSH and free T4 the patient's next visit , and an uptake scan could be considered in the future. At this point however, I would only recommend serial monitoring of the lesions. ______

## 2010-06-09 NOTE — Letter (Signed)
Summary: Results Letter  [facility]  [address] [place], [place] [address]   Phone: [phone]  Fax: [phone]        May 31, 2010 MRN: [id number]    Allison Rojas [address] [place], [place]  [address]    Dear [name],   The biopsies taken during your recent EGD showed no infection or cancer.  You should continue to follow the recommendations that we discussed at the time of your procedure (written on your EGD procedure report).  Please feel free to call if you have any further questions or concerns.       Sincerely,  Rachael Fee MD  This letter has been electronically signed by your physician.  Appended Document: Results Letter Letter Mailed

## 2010-06-09 NOTE — Procedures (Addendum)
Summary: Endoscopy   EGD  Procedure date:  05/25/2010  Findings:      Location: [facility]   ENDOSCOPY PROCEDURE REPORT  PATIENT:  Allison Rojas, Allison Rojas  MR#:  [id number] BIRTHDATE:   08-20-1935, 74 yrs. old   GENDER:   female ENDOSCOPIST:   Rachael Fee, MD Referred by: Marin Roberts, M.D. PROCEDURE DATE:  05/25/2010 PROCEDURE:  EGD with biopsy, [address] ASA CLASS:   Class II INDICATIONS: h/o gastric ulcer, gastritis (in [place]) MEDICATIONS:   Fentanyl 25 mcg IV, Versed 4 mg IV TOPICAL ANESTHETIC:   Exactacain Spray  DESCRIPTION OF PROCEDURE:   After the risks benefits and alternatives of the procedure were thoroughly explained, informed consent was obtained.  The LB [id number]-H180 [id number] endoscope was introduced through the mouth and advanced to the second portion of the duodenum, without limitations.  The instrument was slowly withdrawn as the mucosa was fully examined. <<PROCEDUREIMAGES>>        <<OLD IMAGES>> There was moderate, atrophic appearing gastritis. Most prominant distally. Biopsies taken and sent to pathology (jar 1) (see image1).  Otherwise the examination was normal (see image3, image4, image5, and image2).    Retroflexed views revealed no abnormalities.    The scope was then withdrawn from the patient and the procedure completed. COMPLICATIONS:   None  ENDOSCOPIC IMPRESSION:  1) Moderate gastritis, biopsied to check for H. pylori  2) Otherwise normal examination  RECOMMENDATIONS:  If biopsies show H. pylori, you will be started on the appropriate antibiotics.  Continue to avoid NSAID type meds.  _______________________________ Rachael Fee, MD

## 2010-06-21 ENCOUNTER — Ambulatory Visit (HOSPITAL_COMMUNITY)
Admission: RE | Admit: 2010-06-21 | Discharge: 2010-06-21 | Disposition: A | Discharge status: Home / Self Care | Payer: Self-pay | Source: Ambulatory Visit | Attending: Internal Medicine | Admitting: Internal Medicine

## 2010-06-21 DIAGNOSIS — Z1231 Encounter for screening mammogram for malignant neoplasm of breast: Secondary | ICD-10-CM

## 2010-06-23 ENCOUNTER — Other Ambulatory Visit: Payer: Self-pay | Admitting: Internal Medicine

## 2010-06-23 DIAGNOSIS — R928 Other abnormal and inconclusive findings on diagnostic imaging of breast: Secondary | ICD-10-CM

## 2010-06-30 ENCOUNTER — Ambulatory Visit
Admission: RE | Admit: 2010-06-30 | Discharge: 2010-06-30 | Disposition: A | Discharge status: Home / Self Care | Payer: Self-pay | Source: Ambulatory Visit | Attending: Internal Medicine | Admitting: Internal Medicine

## 2010-06-30 DIAGNOSIS — R928 Other abnormal and inconclusive findings on diagnostic imaging of breast: Secondary | ICD-10-CM

## 2010-07-06 ENCOUNTER — Encounter: Payer: Self-pay | Admitting: Internal Medicine

## 2010-09-07 ENCOUNTER — Encounter: Payer: Self-pay | Admitting: Internal Medicine

## 2010-12-01 ENCOUNTER — Other Ambulatory Visit: Payer: Self-pay | Admitting: *Deleted

## 2010-12-06 MED ORDER — OMEPRAZOLE 40 MG PO CPDR: 40.0000 mg | DELAYED_RELEASE_CAPSULE | Freq: Every day | ORAL | Status: DC

## 2010-12-06 NOTE — Telephone Encounter (Signed)
Pt seen Feb and requested 1-2 month F/U. Cancelled / no showed next two appts. Sent to front desk to make appt

## 2010-12-07 NOTE — Telephone Encounter (Signed)
Refill faxed to [facility]. 

## 2010-12-23 ENCOUNTER — Encounter: Payer: Self-pay | Admitting: Internal Medicine

## 2010-12-23 ENCOUNTER — Ambulatory Visit (INDEPENDENT_AMBULATORY_CARE_PROVIDER_SITE_OTHER): Payer: Self-pay | Admitting: Internal Medicine

## 2010-12-23 DIAGNOSIS — M79605 Pain in left leg: Secondary | ICD-10-CM

## 2010-12-23 DIAGNOSIS — M79609 Pain in unspecified limb: Secondary | ICD-10-CM

## 2010-12-23 DIAGNOSIS — K219 Gastro-esophageal reflux disease without esophagitis: Secondary | ICD-10-CM

## 2010-12-23 DIAGNOSIS — R05 Cough: Secondary | ICD-10-CM

## 2010-12-23 MED ORDER — OMEPRAZOLE 20 MG PO CPDR: 40.0000 mg | DELAYED_RELEASE_CAPSULE | Freq: Every day | ORAL | Status: DC

## 2010-12-23 MED ORDER — TRAMADOL HCL 50 MG PO TABS: 50.0000 mg | ORAL_TABLET | Freq: Four times a day (QID) | ORAL | Status: DC | PRN

## 2010-12-23 MED ORDER — CYCLOBENZAPRINE HCL 10 MG PO TABS: 5.0000 mg | ORAL_TABLET | Freq: Three times a day (TID) | ORAL | Status: DC

## 2010-12-23 MED ORDER — OMEPRAZOLE 40 MG PO CPDR: 40.0000 mg | DELAYED_RELEASE_CAPSULE | Freq: Every day | ORAL | Status: DC

## 2010-12-23 MED ORDER — AMITRIPTYLINE HCL 25 MG PO TABS: 25.0000 mg | ORAL_TABLET | Freq: Every day | ORAL | Status: DC

## 2010-12-23 MED ORDER — HYDROCODONE-HOMATROPINE 5-1.5 MG/5ML PO SYRP: 2.5000 mL | ORAL_SOLUTION | Freq: Four times a day (QID) | ORAL | Status: AC | PRN

## 2010-12-23 NOTE — Progress Notes (Signed)
  Subjective:    Patient ID: Allison Rojas, female    DOB: 12/01/1935, 75 y.o.   MRN: [id number]  HPI [name] is a pleasant 75 year old [place] lady who comes to the clinic with complaint of acute worsening of chronic cough for last 3 days. She is accompanied by her daughter who speaks some English and also has an interpreter to help the encounter. Her daughter says that she's having chronic cough for more than 10 years now. She migrated to [place] from [place] about 10 years before and she is to have dry chronic cough even in [place]. But for past 3 days, her cough is getting worse and she is having intermittent severe coughing spells - few of which are observed during the encounter in which she was having severe dry cough and her face turned red due to congestion.  Her cough is more severe during last 3 nights and she is not able to have any sleep due to continuous coughing-though she also coughs all the time during the day for past 3 days. She does not bring out any significant sputum with cough- little clear phlegm once in a while. No blood or yellow green sputum. Although her physical exam did not show any swollen nasal turbinates, erythema or discharge in throat or mouth, wheezing or other adventitious sounds on lung exam.  She does have intermittent postnasal drip, is being treated with Prilosec 40 mg daily for GERD, and is on Allegra and other over-the-counter allergy medications for recurrent allergies. She does not have any fever, chills, shortness of breath, headache, sinus congestion, vision changes, abdominal pain, nausea, vomiting.      Review of Systems    As per history of present illness, all other systems reviewed and negative. Objective:   Physical Exam Constitutional: Vital signs reviewed.  Patient is thin,  in no acute distress- except intermittent coughing spells- and cooperative with exam. Alert and oriented x3.  Head: Normocephalic and atraumatic Mouth: no erythema or exudates,  MMM, no swollen nasal turbinates. Eyes: PERRL, EOMI, conjunctivae normal, No scleral icterus.  Neck: Supple, Trachea midline normal ROM, No JVD Cardiovascular: RRR, S1 normal, S2 normal, no MRG, pulses symmetric and intact bilaterally Pulmonary/Chest: CTAB, no wheezes, rales, or rhonchi Abdominal: Soft. Non-tender, non-distended, bowel sounds are normal, no masses, organomegaly, or guarding present.  Musculoskeletal: No joint deformities, erythema, or stiffness, ROM full and no nontender Neurological: A&O x3, Strenght is normal and symmetric bilaterally, cranial nerve II-XII are grossly intact, no focal motor deficit, sensory intact to light touch bilaterally.  Skin: Warm, dry and intact. No rash, cyanosis, or clubbing.          Assessment & Plan:

## 2010-12-23 NOTE — Assessment & Plan Note (Addendum)
As described in history of present illness, this seems to be acute exacerbation of her chronic cough. She has perennial sinus allergy and also GERD- both of which are presumably treated. She does not have any diagnosis of asthma and is not wheezing at present. So 2 out of 3 most common causes of chronic cough namely postnasal drip and GERD are being symptomatically treated while she doesn't have the third one namely asthma. Also she has never smoked cigarettes and also does not drink alcohol which might worsen her reflux disease. This acute exacerbation seems to be due to some inciting event which irritated her throat and made her have few coughing spells which started the vicious cycle of cough which aggravated the irritation which aggravated to cough. I would like to try to stop the cycle by giving cough suppressant medication. I will prescribe Hycodan syrup 5 ml every 4 hours as needed for symptomatically if which should probably have the patient for her cough. Explained her daughter about the possible diagnosis and she was understanding. She has an appointment with her primary care at the end of this month and they wish to keep it. Explained them to call the clinic and make an early appointment if cough does not improve after a week of symptomatic care or if she develops fever, purulent sputum, significant shortness of breath.

## 2010-12-23 NOTE — Patient Instructions (Signed)
Please make followup appointment with primary care doctor in 4-5 months. Please cancel the appointment at the end of this month. If her cough doesn't get better after a week of taking the syrup, or she develops fever, chills, green or yellow sputum, shortness of breath, call the clinic and make an appointment. Take the cough syrup-Hycodan-5 mL every 6 hours as needed for cough suppression. She does not seem to have any infection as the cause of her cough.

## 2011-01-11 ENCOUNTER — Ambulatory Visit (INDEPENDENT_AMBULATORY_CARE_PROVIDER_SITE_OTHER): Payer: Self-pay | Admitting: Internal Medicine

## 2011-01-11 ENCOUNTER — Ambulatory Visit (HOSPITAL_COMMUNITY)
Admission: RE | Admit: 2011-01-11 | Discharge: 2011-01-11 | Disposition: A | Discharge status: Home / Self Care | Payer: Self-pay | Source: Ambulatory Visit | Attending: Internal Medicine | Admitting: Internal Medicine

## 2011-01-11 ENCOUNTER — Encounter: Payer: Self-pay | Admitting: Internal Medicine

## 2011-01-11 VITALS — BP 133/78 | HR 82 | Temp 97.8°F | Wt 110.0 lb

## 2011-01-11 DIAGNOSIS — M810 Age-related osteoporosis without current pathological fracture: Secondary | ICD-10-CM

## 2011-01-11 DIAGNOSIS — K2990 Gastroduodenitis, unspecified, without bleeding: Secondary | ICD-10-CM

## 2011-01-11 DIAGNOSIS — M79609 Pain in unspecified limb: Secondary | ICD-10-CM

## 2011-01-11 DIAGNOSIS — M545 Low back pain, unspecified: Secondary | ICD-10-CM

## 2011-01-11 DIAGNOSIS — Z780 Asymptomatic menopausal state: Secondary | ICD-10-CM

## 2011-01-11 DIAGNOSIS — K219 Gastro-esophageal reflux disease without esophagitis: Secondary | ICD-10-CM

## 2011-01-11 DIAGNOSIS — M4120 Other idiopathic scoliosis, site unspecified: Secondary | ICD-10-CM | POA: Insufficient documentation

## 2011-01-11 DIAGNOSIS — K2970 Gastritis, unspecified, without bleeding: Secondary | ICD-10-CM

## 2011-01-11 DIAGNOSIS — R05 Cough: Secondary | ICD-10-CM

## 2011-01-11 DIAGNOSIS — M8448XA Pathological fracture, other site, initial encounter for fracture: Secondary | ICD-10-CM | POA: Insufficient documentation

## 2011-01-11 DIAGNOSIS — Z23 Encounter for immunization: Secondary | ICD-10-CM

## 2011-01-11 DIAGNOSIS — Z13220 Encounter for screening for lipoid disorders: Secondary | ICD-10-CM

## 2011-01-11 DIAGNOSIS — R053 Chronic cough: Secondary | ICD-10-CM

## 2011-01-11 LAB — LIPID PANEL
Cholesterol: 203 mg/dL — ABNORMAL HIGH (ref 0–200)
Triglycerides: 221 mg/dL — ABNORMAL HIGH (ref <150)
VLDL: 44 mg/dL — ABNORMAL HIGH (ref 0–40)

## 2011-01-11 LAB — COMPREHENSIVE METABOLIC PANEL
Albumin: 4.4 g/dL (ref 3.5–5.2)
BUN: 21 mg/dL (ref 6–23)
CO2: 26 mEq/L (ref 19–32)
Glucose, Bld: 89 mg/dL (ref 70–99)
Sodium: 140 mEq/L (ref 135–145)
Total Bilirubin: 0.2 mg/dL — ABNORMAL LOW (ref 0.3–1.2)
Total Protein: 7.1 g/dL (ref 6.0–8.3)

## 2011-01-11 MED ORDER — OMEPRAZOLE 40 MG PO CPDR: 40.0000 mg | DELAYED_RELEASE_CAPSULE | Freq: Every day | ORAL | Status: DC

## 2011-01-11 NOTE — Assessment & Plan Note (Signed)
I have no records of patient previously DEXA scan.  Will not start Fosamax at this time given patient history of gastritis. She is currently taking calcium and vitamin D over-the-counter. -Will send for DEXA scan to evaluate her osteoporosis -I will consider ordering a TSH at next office visit

## 2011-01-11 NOTE — Progress Notes (Signed)
History of present illness: Ms. Allison Rojas is a 75 year old [place] woman who is accompanied by her daughter and an interpreter today for followup on her chronic back pain. Patient states that she has been having this back pain in the past 10 or 12 years and it is getting worse with standing upright, 10 out of 10 in severity, dull aching pain, radiating to her legs bilaterally however no neurological deficits. She states that when she bends over, her pain is improved. She has been taking tramadol for her chronic pain.  Patient's family would like to get some imaging of her back such as x-ray or MRI.   As for her GERD, his symptoms has been controlled. Patient was seen by Dr. [name] for EGD which shows gastritis.  She has been on omeprazole 40 mg by mouth daily.  Patient's daughter also requests for a colonoscopy.   Per patient's daughter, Ms. Allison Rojas was diagnosed with osteoporosis in [place] about 10 years ago and she would like to know if she needs to take any medication for her osteoporosis.   No other complaints today.  She was seen earlier this month for a chronic cough which has resolved.  Review of system: As per history of present illness  Physical examination:  General: alert, thin-appearing woman, and cooperative to examination.   Lungs: normal respiratory effort, no accessory muscle use, normal breath sounds, no crackles, and no wheezes. Heart: normal rate, regular rhythm, no murmur, no gallop, and no rub.  Abdomen: soft, non-tender, normal bowel sounds, no distention, no guarding, no rebound tenderness Msk: no joint swelling, no joint warmth, and no redness over joints.  Pulses: 2+ DP/PT pulses bilaterally Extremities: No cyanosis, clubbing, edema. Positive straight leg raising test bilaterally.  No tenderness to palpation of the lower back.   Neurologic: alert & oriented X3, cranial nerves II-XII intact, strength normal in all extremities, sensation intact to light touch, and gait normal.  Psych:  Oriented X3, memory intact for recent and remote, normally interactive, good eye contact, not anxious appearing, and not depressed appearing.

## 2011-01-11 NOTE — Assessment & Plan Note (Signed)
Resolved

## 2011-01-11 NOTE — Assessment & Plan Note (Signed)
Unclear etiology at this time. Patient states that whenever she bends over, her pain is better which is consistent with spinal stenosis. However other differential diagnoses include malignancies or cord compression which is unlikely given that she does not have any neurological deficit. -Will send for lumbar spine x-ray and if the result is indeterminate, we may proceed with an MRI since she does state that her pain is gradually getting worse. -Will continue Flexeril and tramadol when necessary pain -I will call patient's daughter to inform with abnormal results [phone] or [phone])

## 2011-01-11 NOTE — Patient Instructions (Signed)
Get Xray of back today, I will call you with abnormal results Get labwork today and again I will call you if results is abnormal Continue your other medications

## 2011-01-11 NOTE — Assessment & Plan Note (Addendum)
Stable. EGD by Dr. Gerilyn Pilgrim in February of 2012 showed mild chronic inactive atrophic gastritis with intestinal metaplasia.  Negative for H. pylori or malignancy. Will continue omeprazole 40 mg by mouth daily and avoid NSAIDs.

## 2011-01-13 ENCOUNTER — Other Ambulatory Visit: Payer: Self-pay | Admitting: Internal Medicine

## 2011-01-13 ENCOUNTER — Telehealth: Payer: Self-pay | Admitting: *Deleted

## 2011-01-13 MED ORDER — CALCITONIN (SALMON) 200 UNIT/ACT NA SOLN: 1.0000 | Freq: Every day | NASAL | Status: DC

## 2011-01-13 NOTE — Telephone Encounter (Signed)
Please call the daughter or the translator and discuss lab results, they are concerned because they have not gotten a call yet.

## 2011-01-18 ENCOUNTER — Other Ambulatory Visit: Payer: Self-pay | Admitting: Internal Medicine

## 2011-01-18 DIAGNOSIS — M549 Dorsalgia, unspecified: Secondary | ICD-10-CM

## 2011-01-24 ENCOUNTER — Other Ambulatory Visit (HOSPITAL_COMMUNITY): Payer: Self-pay

## 2011-01-24 ENCOUNTER — Ambulatory Visit (HOSPITAL_COMMUNITY)
Admission: RE | Admit: 2011-01-24 | Discharge: 2011-01-24 | Disposition: A | Discharge status: Home / Self Care | Payer: Self-pay | Source: Ambulatory Visit | Attending: Internal Medicine | Admitting: Internal Medicine

## 2011-01-24 DIAGNOSIS — Z780 Asymptomatic menopausal state: Secondary | ICD-10-CM | POA: Insufficient documentation

## 2011-01-24 DIAGNOSIS — M810 Age-related osteoporosis without current pathological fracture: Secondary | ICD-10-CM | POA: Insufficient documentation

## 2011-01-24 DIAGNOSIS — Z13820 Encounter for screening for osteoporosis: Secondary | ICD-10-CM | POA: Insufficient documentation

## 2011-02-18 ENCOUNTER — Ambulatory Visit (HOSPITAL_COMMUNITY)
Admission: RE | Admit: 2011-02-18 | Discharge: 2011-02-18 | Disposition: A | Discharge status: Home / Self Care | Payer: Self-pay | Source: Ambulatory Visit | Attending: Internal Medicine | Admitting: Internal Medicine

## 2011-02-18 ENCOUNTER — Encounter: Payer: Self-pay | Admitting: Internal Medicine

## 2011-02-18 ENCOUNTER — Ambulatory Visit (INDEPENDENT_AMBULATORY_CARE_PROVIDER_SITE_OTHER): Payer: Self-pay | Admitting: Internal Medicine

## 2011-02-18 VITALS — BP 117/66 | HR 90 | Temp 97.9°F | Ht 61.0 in | Wt 109.3 lb

## 2011-02-18 DIAGNOSIS — M5137 Other intervertebral disc degeneration, lumbosacral region: Secondary | ICD-10-CM | POA: Insufficient documentation

## 2011-02-18 DIAGNOSIS — M51379 Other intervertebral disc degeneration, lumbosacral region without mention of lumbar back pain or lower extremity pain: Secondary | ICD-10-CM | POA: Insufficient documentation

## 2011-02-18 DIAGNOSIS — M519 Unspecified thoracic, thoracolumbar and lumbosacral intervertebral disc disorder: Secondary | ICD-10-CM | POA: Insufficient documentation

## 2011-02-18 DIAGNOSIS — M549 Dorsalgia, unspecified: Secondary | ICD-10-CM

## 2011-02-18 DIAGNOSIS — M545 Low back pain, unspecified: Secondary | ICD-10-CM | POA: Insufficient documentation

## 2011-02-18 DIAGNOSIS — M810 Age-related osteoporosis without current pathological fracture: Secondary | ICD-10-CM

## 2011-02-18 MED ORDER — GADOBENATE DIMEGLUMINE 529 MG/ML IV SOLN
10.0000 mL | Freq: Once | INTRAVENOUS | Status: AC
  Administered 2011-02-18: 10 mL via INTRAVENOUS

## 2011-02-18 MED ORDER — ALENDRONATE SODIUM 35 MG PO TABS: 35.0000 mg | ORAL_TABLET | ORAL | Status: AC

## 2011-02-18 NOTE — Progress Notes (Signed)
  Subjective:    Patient ID: Allison Rojas, female    DOB: 1936/03/30, 75 y.o.   MRN: [id number]  HPI Allison Rojas is a 75 year old [place] woman who is accompanied by her daughter and an interpreter today for followup on her chronic back pain. Patient just had MRI done today and would like to know the results.  MRI: IMPRESSION:  Old healed compression deformity at L1. Disc degeneration at L2-3 more pronounced on the left. Stenosis of the left lateral recess and intervertebral foramen on the left. Discogenic edema and enhancement on the left that could contribute to back pain. Bilateral lateral recess stenosis at L3-4 because of disc degeneration with endplate osteophytes and bulging of the disc in  combination with facet arthropathy. Neural compression could occur in either lateral recess or intervertebral foramen. Mild discogenic change of the endplates on the left. Right lateral recess and foraminal stenosis at L4-5 that could cause right-sided neural compression. Right lateral recess and neural foraminal stenosis at L5-S1 that could cause right-sided neural compression. Discogenic edema of the endplates on the right at this level that could contribute to back pain.  Patient states the tramadol controls her pain, however she is unable to walk without significant pain. She has difficulty with bending motions. Patient's daughter is requesting further evaluation. Furthermore they would like to follow up on osteoporosis and bone density scan results.   No other complaints or concerns today.    Review of Systems  Genitourinary: Negative for dysuria, urgency, frequency and enuresis.  Musculoskeletal: Positive for back pain and gait problem.  Neurological: Negative for dizziness, weakness and numbness.       Objective:   Physical Exam  Constitutional: She appears well-developed.  HENT:  Head: Normocephalic.  Neck: Normal range of motion. Neck supple.  Cardiovascular: Normal rate and regular rhythm.    Pulmonary/Chest: Effort normal and breath sounds normal.  Musculoskeletal: She exhibits no edema and no tenderness.       Normal gait, though does walk slightly hunched over  Neurological: She has normal reflexes.          Assessment & Plan:

## 2011-02-18 NOTE — Patient Instructions (Signed)
Please follow up with Sports medicine as scheduled. Please start taking medicine for bones, if you start to develop pain in your stomach, please stop taking and notify your doctor.

## 2011-02-20 NOTE — Assessment & Plan Note (Signed)
Bone density scan does not indicate T score, however it appears patient does have significant osteoporosis. Additionally she has old L1 compression fracture. Patient's daughter is requesting bisphosphonate. Patient has history of mild chronic inactive gastritis per EGD done earlier this year. There is also mention of prior peptic ulcer disease but path report from Feb 2012 was negative for H. Pylori. Given prior vertebral fx and bone density scan indicative for osteoporosis, will prescribe Fosamax. Advised patient to stop taking if develops abdominal/epigastric pain. At this point risks for fragility fracture outweigh risks for gastritis, thus will place on bisphosphonate, and continue Vit D supplementation.

## 2011-02-20 NOTE — Assessment & Plan Note (Signed)
Patient with back pain for several years. MRI does illustrate old compression fx, with degeneration, and significant stenosis. Patient unlikely a surgical candidate, however may benefit from back injections. Will refer to sports medicine as they do offer epidural/steroidal injections and can further evaluate, and they accept orange card. Will continue tramadol as it controls her pain.

## 2011-03-04 ENCOUNTER — Ambulatory Visit: Payer: Self-pay | Admitting: Family Medicine

## 2011-03-29 ENCOUNTER — Encounter: Payer: Self-pay | Admitting: Internal Medicine

## 2011-04-01 ENCOUNTER — Ambulatory Visit (INDEPENDENT_AMBULATORY_CARE_PROVIDER_SITE_OTHER): Payer: Self-pay | Admitting: Family Medicine

## 2011-04-01 DIAGNOSIS — M8448XA Pathological fracture, other site, initial encounter for fracture: Secondary | ICD-10-CM

## 2011-04-01 DIAGNOSIS — IMO0002 Reserved for concepts with insufficient information to code with codable children: Secondary | ICD-10-CM

## 2011-04-01 DIAGNOSIS — M4846XA Fatigue fracture of vertebra, lumbar region, initial encounter for fracture: Secondary | ICD-10-CM

## 2011-04-01 DIAGNOSIS — M5416 Radiculopathy, lumbar region: Secondary | ICD-10-CM

## 2011-04-01 DIAGNOSIS — M545 Low back pain: Secondary | ICD-10-CM

## 2011-04-01 MED ORDER — GABAPENTIN 300 MG PO CAPS: ORAL_CAPSULE | ORAL | Status: DC

## 2011-04-01 MED ORDER — METHYLPREDNISOLONE 4 MG PO KIT: PACK | ORAL | Status: AC

## 2011-04-01 NOTE — Patient Instructions (Signed)
We'll start her on Medrol Dosepak Will start her on Neurontin Referred to physical therapy We'll see her back after completing physical therapy for a followup after completing PT

## 2011-04-01 NOTE — Progress Notes (Signed)
Subjective:    Patient ID: Allison Rojas, female    DOB: 04/09/1936, 75 y.o.   MRN: [id number]  HPI  Allison Rojas is a 26 [place] patient, complaining of back pain and  right lower extremity lumbar radiculopathy for the last 10 years. She stated that her pain has gotten worse in the last year. She has been taking Tramadol for pain without much improvement. She states that her pain is 7/10, sharp, on and off, worse with walking and activities, radiated to her right lower extremity, no numbness or tingling, no weakness, no saddle anesthesia, no urinary or fecal incontinence. She was seen at internal medicine and she had an MRI of her lumbar spine ordered and done in 01/18/11 the report is at the end of this note. She never has had any formal treatment for her back pain, no physical therapy: No epidural injections the    Patient Active Problem List  Diagnoses  . NECK PAIN, LEFT  . LOW BACK PAIN, CHRONIC  . LEG PAIN, BILATERAL  . OSTEOPOROSIS  . INSOMNIA  . PERSONAL HISTORY OF PEPTIC ULCER DISEASE  . GASTRITIS  . ?Arrhythmia  . Thyroid nodule  . Breast calcification seen on mammogram  . Anemia  . Preventative health care   Current Outpatient Prescriptions on File Prior to Visit  Medication Sig Dispense Refill  . alendronate (FOSAMAX) 35 MG tablet Take 1 tablet (35 mg total) by mouth every 7 (seven) days. Take with a full glass of water on an empty stomach.  4 tablet  11  . amitriptyline (ELAVIL) 25 MG tablet Take 1 tablet (25 mg total) by mouth at bedtime.  30 tablet  6  . calcium-vitamin D (OSCAL WITH D 500-200) 500-200 MG-UNIT per tablet Take 2 tablets by mouth daily.        . cyclobenzaprine (FLEXERIL) 10 MG tablet Take 0.5 tablets (5 mg total) by mouth 3 (three) times daily.  60 tablet  6  . omeprazole (PRILOSEC) 40 MG capsule Take 1 capsule (40 mg total) by mouth daily.  60 capsule  11  . traMADol (ULTRAM) 50 MG tablet Take 1 tablet (50 mg total) by mouth every 6 (six) hours as needed for  pain.  90 tablet  6   No Known Allergies       Review of Systems  Constitutional: Negative for fever, chills, diaphoresis and fatigue.  Musculoskeletal: Negative for back pain, joint swelling and arthralgias.  Neurological: Negative for weakness and numbness.       Objective:   Physical Exam  Constitutional: She appears well-developed and well-nourished.       There were no vitals taken for this visit.   Pulmonary/Chest: Effort normal.  Musculoskeletal:       Low back with intact skin. No swelling, no hematomas. Kyphosis deformity, FROM for flexion, decrease extension,  Decrease rotation and lateralization.  Tenderness to palpation on SI joint area B/L. Faber test positive for SI joint pain. Straight leg raise negative B/L. Strength 5/5 for hip flexion and extension, 5/5 for knee flexion and extension, 5/5 for ankle plantar and dorsal flexion B/L. DTR patellar and achilles II/IV B/L Sensation intact distally B/L. No leg discrepancy.   Neurological: She is alert.  Skin: Skin is warm. No rash noted. No erythema. No pallor.  Psychiatric: She has a normal mood and affect. Her behavior is normal.     Lumbar MRI IMPRESSION:  Old healed compression deformity at L1. No retropulsion.  Disc degeneration at L2-3 more pronounced  on the left. Stenosis of  the left lateral recess and intervertebral foramen on the left.  Discogenic edema and enhancement on the left that could contribute  to back pain.  Bilateral lateral recess stenosis at L3-4 because of disc  degeneration with endplate osteophytes and bulging of the disc in  combination with facet arthropathy. Neural compression could occur  in either lateral recess or intervertebral foramen. Mild  discogenic change of the endplates on the left.  Right lateral recess and foraminal stenosis at L4-5 that could  cause right-sided neural compression.  Right lateral recess and neural foraminal stenosis at L5-S1 that  could cause  right-sided neural compression. Discogenic edema of  the endplates on the right at this level that could contribute to  back pain.       Assessment & Plan:   1. LOW BACK PAIN, CHRONIC  methylPREDNISolone (MEDROL, PAK,) 4 MG tablet, gabapentin (NEURONTIN) 300 MG capsule, Ambulatory referral to Physical Therapy  2. Lumbar radiculopathy  methylPREDNISolone (MEDROL, PAK,) 4 MG tablet, gabapentin (NEURONTIN) 300 MG capsule, Ambulatory referral to Physical Therapy  3. Stress fracture of lumbar vertebra  methylPREDNISolone (MEDROL, PAK,) 4 MG tablet, gabapentin (NEURONTIN) 300 MG capsule, Ambulatory referral to Physical Therapy   We'll start her on Medrol Dosepak Will start her on Neurontin Referred to physical therapy We'll see her back after completing physical therapy for a followup after completing PT

## 2011-05-17 ENCOUNTER — Encounter: Payer: Self-pay | Admitting: Internal Medicine

## 2011-05-24 ENCOUNTER — Encounter: Payer: Self-pay | Admitting: Internal Medicine

## 2011-05-24 ENCOUNTER — Ambulatory Visit (INDEPENDENT_AMBULATORY_CARE_PROVIDER_SITE_OTHER): Payer: Self-pay | Admitting: Internal Medicine

## 2011-05-24 DIAGNOSIS — M549 Dorsalgia, unspecified: Secondary | ICD-10-CM

## 2011-05-24 DIAGNOSIS — K0889 Other specified disorders of teeth and supporting structures: Secondary | ICD-10-CM | POA: Insufficient documentation

## 2011-05-24 DIAGNOSIS — K089 Disorder of teeth and supporting structures, unspecified: Secondary | ICD-10-CM

## 2011-05-24 DIAGNOSIS — K219 Gastro-esophageal reflux disease without esophagitis: Secondary | ICD-10-CM

## 2011-05-24 MED ORDER — ALUM & MAG HYDROXIDE-SIMETH 500-450-40 MG/5ML PO SUSP: 15.0000 mL | Freq: Four times a day (QID) | ORAL | Status: AC | PRN

## 2011-05-24 MED ORDER — HYDROCODONE-ACETAMINOPHEN 5-500 MG PO TABS: 1.0000 | ORAL_TABLET | Freq: Three times a day (TID) | ORAL | Status: DC | PRN

## 2011-05-24 NOTE — Progress Notes (Signed)
Subjective:   Patient ID: [name] female   DOB: Sep 17, 1935 76 y.o.   MRN: [id number]  HPI:  [name] is a 76 y.o. With PMH of osteoporosis and chronic lower back pain, who presents with tooth pain. Patient does not speak [place], so translator was with her. Patient reports that she has tooth pain in last 3 molars in her upper jaw bilaterally for about 6 months. She did not see any dentists due to lack of insurance. She is taking tylenol and tramadol which is for her back pain now. She feels her problem is getting worse. She does not have fever or chills. No facial swelling or obvious abscess around her painful teeth. Her teeth are very sensitive to hot or cold water. She has difficult chewing.   Patient also reports that she has been taking oral alendronate for her osteoporosis. Recently, she has heart burning. She is on omeprazole which helps only partially for her heart burning.   Denies headaches,  cough, chest pain, SOB,  abdominal pain,diarrhea, constipation, dysuria, urgency, frequency, hematuria.     Past Medical History  Diagnosis Date  . Gastritis   . Rib pain   . Osteoporosis   . Insomnia   . Peptic ulcer disease   . Chronic low back pain    Current Outpatient Prescriptions  Medication Sig Dispense Refill  . alendronate (FOSAMAX) 35 MG tablet Take 1 tablet (35 mg total) by mouth every 7 (seven) days. Take with a full glass of water on an empty stomach.  4 tablet  11  . amitriptyline (ELAVIL) 25 MG tablet Take 1 tablet (25 mg total) by mouth at bedtime.  30 tablet  6  . calcium-vitamin D (OSCAL WITH D 500-200) 500-200 MG-UNIT per tablet Take 2 tablets by mouth daily.        . cyclobenzaprine (FLEXERIL) 10 MG tablet Take 0.5 tablets (5 mg total) by mouth 3 (three) times daily.  60 tablet  6  . omeprazole (PRILOSEC) 40 MG capsule Take 1 capsule (40 mg total) by mouth daily.  60 capsule  11  . traMADol (ULTRAM) 50 MG tablet Take 1 tablet (50 mg total) by mouth every 6  (six) hours as needed for pain.  90 tablet  6  . aluminum & magnesium hydroxide-simethicone (MYLANTA) 500-450-40 MG/5ML suspension Take 15 mLs by mouth every 6 (six) hours as needed for indigestion.  355 mL  3  . HYDROcodone-acetaminophen (VICODIN) 5-500 MG per tablet Take 1 tablet by mouth every 8 (eight) hours as needed for pain.  20 tablet  0   No family history on file. History   Social History  . Marital Status: Widowed    Spouse Name: N/A    Number of Children: N/A  . Years of Education: N/A   Social History Main Topics  . Smoking status: Never Smoker   . Smokeless tobacco: None  . Alcohol Use: No  . Drug Use: No  . Sexually Active: None   Other Topics Concern  . None   Social History Narrative   Financial assistance approved for 100% discount at [facility] and has [employer] card per Deborah Hill8/15/2011She is widowed, she has 2 children, she does not smoke cigarettes or drink alcohol   Review of Systems:  General: no fevers, chills, no changes in body weight, no changes in appetite Skin: no rash HEENT: no blurry vision, hearing changes or sore throat Teeth: her last 3 molars are flattened on both sides. There is no exudation or  abscess around these teeth. Pulm: no dyspnea, coughing, wheezing CV: no chest pain, palpitations, shortness of breath Abd: no nausea/vomiting, abdominal pain, diarrhea/constipation GU: no dysuria, hematuria, polyuria Ext: tender over her lower back. Neuro: no weakness, numbness, or tingling    Objective:  Physical Exam: Filed Vitals:   05/24/11 1558  BP: 123/76  Pulse: 89  Temp: 98.5 F (36.9 C)  TempSrc: Oral  Height: 5\' 1"  (1.549 m)  Weight: 107 lb 8 oz (48.762 kg)  SpO2: 100%    General: not in acute distress HEENT: PERRL, EOMI, no scleral icterus Teeth:  her last 3 molars are flattened on both sides. There is no exudation or abscess around these teeth. Cardiac: S1/S2, RRR, No murmurs, gallops or rubs Pulm: Good air movement  bilaterally, Clear to auscultation bilaterally, No rales, wheezing, rhonchi or rubs. Abd: Soft,  nondistended, nontender, no rebound pain, no organomegaly, BS present Ext: No rashes or edema, 2+DP/PT pulse bilaterally Neuro: alert and oriented X3, cranial nerves II-XII grossly intact, muscle strength 5/5 in all extremeties,  sensation to light touch intact.    Assessment & Plan:   #. Tooth pain: patient dose not have fever or chill. No abscess or exudate were seen around the painful teeth. No indications for antibiotics. Will give her referral to dentist and give Vicodin for pain to bridge her to see the dentist.   #. GERD: likely due to the side effect of her alendronate which is for her osteoporosis. She is taking omeprazole with partial relief. Will give Maalox to better control her symptoms.  #. Back pain: stable. Well controlled with tramadol and flexeril. Will continue current regimen and follow up.  Lorretta Harp

## 2011-05-24 NOTE — Patient Instructions (Signed)
1. Please take all medications as prescribed.  2. If you have worsening of your symptoms or new symptoms arise, please call the clinic ([phone]), or go to the ER immediately if symptoms are severe. 3.  

## 2011-05-25 NOTE — Progress Notes (Signed)
agree

## 2011-06-23 ENCOUNTER — Other Ambulatory Visit: Payer: Self-pay | Admitting: *Deleted

## 2011-06-23 DIAGNOSIS — M8448XA Pathological fracture, other site, initial encounter for fracture: Secondary | ICD-10-CM

## 2011-06-23 DIAGNOSIS — IMO0002 Reserved for concepts with insufficient information to code with codable children: Secondary | ICD-10-CM

## 2011-06-23 DIAGNOSIS — M545 Low back pain: Secondary | ICD-10-CM

## 2011-06-23 NOTE — Progress Notes (Signed)
Per pt's daughter- she has not been contacted by PT yet.  Another order sent to [facility] on [address]- faxed and sent electronically.  Asked daughter to call back if they have not heard from PT office in a week.

## 2011-06-29 ENCOUNTER — Ambulatory Visit: Payer: Self-pay | Attending: Family Medicine | Admitting: Rehabilitation

## 2011-06-29 DIAGNOSIS — M545 Low back pain, unspecified: Secondary | ICD-10-CM | POA: Insufficient documentation

## 2011-06-29 DIAGNOSIS — R293 Abnormal posture: Secondary | ICD-10-CM | POA: Insufficient documentation

## 2011-06-29 DIAGNOSIS — R262 Difficulty in walking, not elsewhere classified: Secondary | ICD-10-CM | POA: Insufficient documentation

## 2011-06-29 DIAGNOSIS — M2560 Stiffness of unspecified joint, not elsewhere classified: Secondary | ICD-10-CM | POA: Insufficient documentation

## 2011-06-29 DIAGNOSIS — IMO0001 Reserved for inherently not codable concepts without codable children: Secondary | ICD-10-CM | POA: Insufficient documentation

## 2011-07-11 ENCOUNTER — Encounter: Payer: Self-pay | Admitting: Internal Medicine

## 2011-07-13 ENCOUNTER — Ambulatory Visit: Payer: Self-pay | Admitting: Physical Therapy

## 2011-07-21 ENCOUNTER — Ambulatory Visit: Payer: Self-pay | Attending: Rehabilitation | Admitting: Rehabilitation

## 2011-07-21 DIAGNOSIS — M2560 Stiffness of unspecified joint, not elsewhere classified: Secondary | ICD-10-CM | POA: Insufficient documentation

## 2011-07-21 DIAGNOSIS — M545 Low back pain, unspecified: Secondary | ICD-10-CM | POA: Insufficient documentation

## 2011-07-21 DIAGNOSIS — R262 Difficulty in walking, not elsewhere classified: Secondary | ICD-10-CM | POA: Insufficient documentation

## 2011-07-21 DIAGNOSIS — IMO0001 Reserved for inherently not codable concepts without codable children: Secondary | ICD-10-CM | POA: Insufficient documentation

## 2011-07-21 DIAGNOSIS — R293 Abnormal posture: Secondary | ICD-10-CM | POA: Insufficient documentation

## 2011-07-28 ENCOUNTER — Ambulatory Visit: Payer: Self-pay | Admitting: Rehabilitation

## 2011-08-04 ENCOUNTER — Ambulatory Visit: Payer: Self-pay | Admitting: Rehabilitation

## 2011-08-08 ENCOUNTER — Other Ambulatory Visit: Payer: Self-pay | Admitting: *Deleted

## 2011-08-08 DIAGNOSIS — M79605 Pain in left leg: Secondary | ICD-10-CM

## 2011-08-08 DIAGNOSIS — M79604 Pain in right leg: Secondary | ICD-10-CM

## 2011-08-09 ENCOUNTER — Ambulatory Visit (INDEPENDENT_AMBULATORY_CARE_PROVIDER_SITE_OTHER): Payer: Self-pay | Admitting: Internal Medicine

## 2011-08-09 ENCOUNTER — Encounter: Payer: Self-pay | Admitting: Internal Medicine

## 2011-08-09 VITALS — BP 119/74 | HR 94 | Temp 97.9°F | Ht 61.0 in | Wt 105.9 lb

## 2011-08-09 DIAGNOSIS — I739 Peripheral vascular disease, unspecified: Secondary | ICD-10-CM

## 2011-08-09 DIAGNOSIS — M545 Low back pain, unspecified: Secondary | ICD-10-CM

## 2011-08-09 DIAGNOSIS — K219 Gastro-esophageal reflux disease without esophagitis: Secondary | ICD-10-CM

## 2011-08-09 DIAGNOSIS — R05 Cough: Secondary | ICD-10-CM

## 2011-08-09 DIAGNOSIS — J309 Allergic rhinitis, unspecified: Secondary | ICD-10-CM

## 2011-08-09 DIAGNOSIS — G8929 Other chronic pain: Secondary | ICD-10-CM

## 2011-08-09 DIAGNOSIS — J310 Chronic rhinitis: Secondary | ICD-10-CM

## 2011-08-09 DIAGNOSIS — M79604 Pain in right leg: Secondary | ICD-10-CM

## 2011-08-09 DIAGNOSIS — R059 Cough, unspecified: Secondary | ICD-10-CM

## 2011-08-09 MED ORDER — OMEPRAZOLE 40 MG PO CPDR: 40.0000 mg | DELAYED_RELEASE_CAPSULE | Freq: Every day | ORAL | Status: DC

## 2011-08-09 MED ORDER — AMITRIPTYLINE HCL 25 MG PO TABS: 25.0000 mg | ORAL_TABLET | Freq: Every day | ORAL | Status: DC

## 2011-08-09 MED ORDER — CYCLOBENZAPRINE HCL 10 MG PO TABS: 5.0000 mg | ORAL_TABLET | Freq: Three times a day (TID) | ORAL | Status: DC

## 2011-08-09 MED ORDER — TRAMADOL HCL 50 MG PO TABS: 50.0000 mg | ORAL_TABLET | Freq: Four times a day (QID) | ORAL | Status: DC | PRN

## 2011-08-09 MED ORDER — PSEUDOEPHEDRINE-CODEINE-GG 30-10-100 MG/5ML PO SOLN: 10.0000 mL | Freq: Four times a day (QID) | ORAL | Status: AC | PRN

## 2011-08-09 MED ORDER — TRAMADOL HCL 50 MG PO TABS: 100.0000 mg | ORAL_TABLET | Freq: Four times a day (QID) | ORAL | Status: DC | PRN

## 2011-08-09 MED ORDER — FLUTICASONE PROPIONATE 50 MCG/ACT NA SUSP: 2.0000 | Freq: Every day | NASAL | Status: DC

## 2011-08-09 NOTE — Patient Instructions (Signed)
Please, follow up with a sports medicine clinic. Please, note an increase in Tramadol dose up to 100 mg  Every 6 hours as needed. Please, follow up with Dr. Anselm Jungling in 4 weeks or sooner.

## 2011-08-10 ENCOUNTER — Encounter: Payer: Self-pay | Admitting: Internal Medicine

## 2011-08-10 NOTE — Progress Notes (Signed)
Patient ID: Allison Rojas, female   DOB: September 17, 1935, 76 y.o.   MRN: [id number] HPI:    1. LBP. Reports continuous LBP 7/10 in intensity; undergoing PT. 2. Runny nose, sneezing and nocturnal cough; no fever, chills,r ash. Used Claritin and zyrtec in the past without any relief. Review of Systems: Negative except per history of present illness  Physical Exam:  Nursing notes and vitals reviewed General:  alert, well-developed, and cooperative to examination.   Lungs:  normal respiratory effort, no accessory muscle use, normal breath sounds, no crackles, and no wheezes. Heart:  normal rate, regular rhythm, no murmurs, no gallop, and no rub.   Abdomen:  soft, non-tender, normal bowel sounds, no distention, no guarding, no rebound tenderness, no hepatomegaly, and no splenomegaly.   Extremities:  No cyanosis, clubbing, edema Neurologic:  alert & oriented X3, nonfocal exam  Meds:  (Not in a hospital admission)  Allergies: Review of patient's allergies indicates no known allergies. Past Medical History  Diagnosis Date  . Gastritis   . Rib pain   . Osteoporosis   . Insomnia   . Peptic ulcer disease   . Chronic low back pain    No past surgical history on file. No family history on file. History   Social History  . Marital Status: Widowed    Spouse Name: N/A    Number of Children: N/A  . Years of Education: N/A   Occupational History  . Not on file.   Social History Main Topics  . Smoking status: Never Smoker   . Smokeless tobacco: Not on file  . Alcohol Use: No  . Drug Use: No  . Sexually Active: Not on file   Other Topics Concern  . Not on file   Social History Narrative   Financial assistance approved for 100% discount at [facility] and has [facility] card per Gavin Pound [place]8/15/2011She is widowed, she has 2 children, she does not smoke cigarettes or drink alcohol   A/P: 1. LBP, chronic -hx of old healed compression osteoporotic frx of L-spine -continue with Fosamax -weight bearing  exercise daily -increase tramadol 100 mg PO q 6 hrs PRN -continue flexeril 10 mg PIO tid -Sports medicine referral  2. Peripheral vascular insufficiency - elevate LE above heart level while being supine or seated -low salt diet -consider compression stocking  3. Allergic rhinitis -Flonase NS -cheratussin PRN, cautioned of sedation -avoid allergens

## 2011-08-11 ENCOUNTER — Encounter: Payer: Self-pay | Admitting: Rehabilitation

## 2011-08-18 ENCOUNTER — Ambulatory Visit: Payer: Self-pay | Attending: Family Medicine | Admitting: Rehabilitation

## 2011-08-18 DIAGNOSIS — IMO0001 Reserved for inherently not codable concepts without codable children: Secondary | ICD-10-CM | POA: Insufficient documentation

## 2011-08-18 DIAGNOSIS — R293 Abnormal posture: Secondary | ICD-10-CM | POA: Insufficient documentation

## 2011-08-18 DIAGNOSIS — R262 Difficulty in walking, not elsewhere classified: Secondary | ICD-10-CM | POA: Insufficient documentation

## 2011-08-18 DIAGNOSIS — M545 Low back pain, unspecified: Secondary | ICD-10-CM | POA: Insufficient documentation

## 2011-08-18 DIAGNOSIS — M2560 Stiffness of unspecified joint, not elsewhere classified: Secondary | ICD-10-CM | POA: Insufficient documentation

## 2011-08-19 ENCOUNTER — Ambulatory Visit: Payer: Self-pay | Admitting: Family Medicine

## 2011-08-23 ENCOUNTER — Encounter: Payer: Self-pay | Admitting: Rehabilitation

## 2011-08-25 ENCOUNTER — Ambulatory Visit: Payer: Self-pay | Admitting: Rehabilitation

## 2011-09-01 ENCOUNTER — Ambulatory Visit (INDEPENDENT_AMBULATORY_CARE_PROVIDER_SITE_OTHER): Payer: Self-pay | Admitting: Family Medicine

## 2011-09-01 VITALS — BP 124/64 | Ht 61.0 in | Wt 107.0 lb

## 2011-09-01 DIAGNOSIS — M79609 Pain in unspecified limb: Secondary | ICD-10-CM

## 2011-09-01 DIAGNOSIS — M545 Low back pain: Secondary | ICD-10-CM

## 2011-09-01 MED ORDER — GABAPENTIN 300 MG PO CAPS: ORAL_CAPSULE | ORAL | Status: DC

## 2011-09-01 NOTE — Progress Notes (Signed)
Subjective:    Patient ID: Allison Rojas, female    DOB: 1935-08-01, 76 y.o.   MRN: [id number]  HPI  Allison Rojas is a pleasant 76 years old and the patient would like to follow up back pain. She was seen initially on December and referred to physical therapy. She was also started on a Medrol Dosepak which helped her while she was taken it.  She states that physical therapy has helped her, her pain has decreased, she feels that she is more flexible . Her pain is located in her left back, radiated to her right lower extremity, numbness and tingling present on and off. She denies any that'll anesthesia. She denies any incontinence. She is taking tramadol for pain control tid, she is also taking Elavil at night.  Patient Active Problem List  Diagnoses  . NECK PAIN, LEFT  . LOW BACK PAIN, CHRONIC  . LEG PAIN, BILATERAL  . OSTEOPOROSIS  . INSOMNIA  . PERSONAL HISTORY OF PEPTIC ULCER DISEASE  . GASTRITIS  . ?Arrhythmia  . Thyroid nodule  . Breast calcification seen on mammogram  . Anemia  . Preventative health care  . Tooth pain   Current Outpatient Prescriptions on File Prior to Visit  Medication Sig Dispense Refill  . alendronate (FOSAMAX) 35 MG tablet Take 1 tablet (35 mg total) by mouth every 7 (seven) days. Take with a full glass of water on an empty stomach.  4 tablet  11  . amitriptyline (ELAVIL) 25 MG tablet Take 1 tablet (25 mg total) by mouth at bedtime.  30 tablet  6  . calcium-vitamin D (OSCAL WITH D 500-200) 500-200 MG-UNIT per tablet Take 2 tablets by mouth daily.        . cyclobenzaprine (FLEXERIL) 10 MG tablet Take 0.5 tablets (5 mg total) by mouth 3 (three) times daily.  60 tablet  6  . fluticasone (FLONASE) 50 MCG/ACT nasal spray Place 2 sprays into the nose daily.  16 g  2  . gabapentin (NEURONTIN) 300 MG capsule Take 1 capsule po at HS for 2 weeks, then 1 capsule bid .  30 capsule  2  . HYDROcodone-acetaminophen (VICODIN) 5-500 MG per tablet Take 1 tablet by mouth every 8  (eight) hours as needed for pain.  20 tablet  0  . omeprazole (PRILOSEC) 40 MG capsule Take 1 capsule (40 mg total) by mouth daily.  60 capsule  11  . traMADol (ULTRAM) 50 MG tablet Take 1 tablet (50 mg total) by mouth every 6 (six) hours as needed for pain.  90 tablet  0  . traMADol (ULTRAM) 50 MG tablet Take 2 tablets (100 mg total) by mouth every 6 (six) hours as needed for pain.  120 tablet  5   No Known Allergies   Review of Systems  Constitutional: Negative for fever, chills, diaphoresis and fatigue.  Musculoskeletal: Positive for back pain. Negative for joint swelling, arthralgias and gait problem.  Neurological: Positive for numbness. Negative for weakness.       Objective:   Physical Exam  Constitutional: She is oriented to person, place, and time.       BP 124/64  Ht 5\' 1"  (1.549 m)  Wt 107 lb (48.535 kg)  BMI 20.22 kg/m2   Pulmonary/Chest: Effort normal.  Musculoskeletal:       Low back with intact skin. No swelling, no hematomas. Kyphosis deformity, FROM for flexion, decrease extension,  Decrease rotation and lateralization.  Tenderness to palpation on SI joint area B/L. Allison Rojas  test positive for SI joint pain. Straight leg raise negative B/L. Strength 5/5 for hip flexion and extension, 5/5 for knee flexion and extension, 5/5 for ankle plantar and dorsal flexion B/L. DTR patellar and achilles II/IV B/L Sensation intact distally B/L. No leg discrepancy.   Neurological: She is alert and oriented to person, place, and time.  Skin: Skin is warm. No rash noted. No erythema.  Psychiatric: She has a normal mood and affect. Her behavior is normal. Thought content normal.          Assessment & Plan:    1. LOW BACK PAIN, CHRONIC  gabapentin (NEURONTIN) 300 MG capsule, Ambulatory referral to Physical Therapy  2. LEG PAIN, BILATERAL  gabapentin (NEURONTIN) 300 MG capsule, Ambulatory referral to Physical Therapy   Cont PT as she has improve so much with it. New referral  order sent. Neurontin 300 mg po Hs for 2 weeks them bid to help her radiculopathy. F/U after completing PT.

## 2011-09-22 ENCOUNTER — Ambulatory Visit: Payer: Self-pay | Attending: Rehabilitation | Admitting: Rehabilitation

## 2011-09-22 DIAGNOSIS — M545 Low back pain, unspecified: Secondary | ICD-10-CM | POA: Insufficient documentation

## 2011-09-22 DIAGNOSIS — IMO0001 Reserved for inherently not codable concepts without codable children: Secondary | ICD-10-CM | POA: Insufficient documentation

## 2011-09-22 DIAGNOSIS — R262 Difficulty in walking, not elsewhere classified: Secondary | ICD-10-CM | POA: Insufficient documentation

## 2011-09-22 DIAGNOSIS — R293 Abnormal posture: Secondary | ICD-10-CM | POA: Insufficient documentation

## 2011-09-22 DIAGNOSIS — M2560 Stiffness of unspecified joint, not elsewhere classified: Secondary | ICD-10-CM | POA: Insufficient documentation

## 2011-09-29 ENCOUNTER — Encounter: Payer: Self-pay | Admitting: Rehabilitation

## 2011-10-06 ENCOUNTER — Ambulatory Visit: Payer: Self-pay | Admitting: Rehabilitation

## 2011-10-13 ENCOUNTER — Ambulatory Visit: Payer: Self-pay | Admitting: Rehabilitation

## 2011-10-27 ENCOUNTER — Encounter: Payer: Self-pay | Admitting: Rehabilitation

## 2011-11-10 ENCOUNTER — Encounter: Payer: Self-pay | Admitting: Rehabilitation

## 2012-01-05 ENCOUNTER — Other Ambulatory Visit: Payer: Self-pay | Admitting: Internal Medicine

## 2012-01-27 ENCOUNTER — Other Ambulatory Visit: Payer: Self-pay | Admitting: Internal Medicine

## 2012-02-27 ENCOUNTER — Other Ambulatory Visit: Payer: Self-pay | Admitting: Internal Medicine

## 2012-02-28 NOTE — Telephone Encounter (Signed)
Needs visit and labs

## 2012-02-28 NOTE — Telephone Encounter (Signed)
Flag sent to front office desk for appt 1 month per Dr Aundria Rud.

## 2012-03-19 ENCOUNTER — Ambulatory Visit: Payer: Self-pay | Admitting: Internal Medicine

## 2012-04-02 ENCOUNTER — Other Ambulatory Visit: Payer: Self-pay | Admitting: Internal Medicine

## 2012-05-11 ENCOUNTER — Other Ambulatory Visit: Payer: Self-pay | Admitting: Internal Medicine

## 2012-05-11 NOTE — Telephone Encounter (Signed)
I already refilled this AM. I have not seen patient for over 1 year.

## 2012-05-21 ENCOUNTER — Ambulatory Visit
Admission: RE | Admit: 2012-05-21 | Discharge: 2012-05-21 | Disposition: A | Discharge status: Home / Self Care | Payer: Medicaid Other | Source: Ambulatory Visit | Attending: Family Medicine | Admitting: Family Medicine

## 2012-05-21 ENCOUNTER — Other Ambulatory Visit: Payer: Self-pay | Admitting: Family Medicine

## 2012-05-21 ENCOUNTER — Ambulatory Visit (INDEPENDENT_AMBULATORY_CARE_PROVIDER_SITE_OTHER): Payer: Medicaid Other | Admitting: Family Medicine

## 2012-05-21 VITALS — BP 98/60 | Ht 60.0 in | Wt 105.0 lb

## 2012-05-21 DIAGNOSIS — M549 Dorsalgia, unspecified: Secondary | ICD-10-CM

## 2012-05-21 DIAGNOSIS — S22009A Unspecified fracture of unspecified thoracic vertebra, initial encounter for closed fracture: Secondary | ICD-10-CM | POA: Diagnosis not present

## 2012-05-22 ENCOUNTER — Telehealth: Payer: Self-pay | Admitting: Family Medicine

## 2012-05-22 DIAGNOSIS — S22080A Wedge compression fracture of T11-T12 vertebra, initial encounter for closed fracture: Secondary | ICD-10-CM | POA: Insufficient documentation

## 2012-05-22 MED ORDER — HYDROCODONE-ACETAMINOPHEN 5-325 MG PO TABS: 1.0000 | ORAL_TABLET | Freq: Four times a day (QID) | ORAL | Status: DC | PRN

## 2012-05-22 MED ORDER — TRAMADOL HCL 50 MG PO TABS: 50.0000 mg | ORAL_TABLET | Freq: Three times a day (TID) | ORAL | Status: DC

## 2012-05-22 NOTE — Addendum Note (Signed)
Addended by: Jacki Cones C on: 05/22/2012 05:22 PM   Modules accepted: Orders, Medications

## 2012-05-22 NOTE — Progress Notes (Signed)
  Subjective:    Patient ID: Allison Rojas, female    DOB: [birth year], 77 y.o.   MRN: [id number]  HPI 9 days ago she fell while she was ascending some stairs. She was on the sixth step and fell backwards landing on the fifth step landing on her but. Since then she's had significant back pain. She's tried several things in nothing seems to help. She's had no shortness of breath. No lower extremity weakness.   Review of Systems Denies bowel or bladder incontinence.    Objective:   Physical Exam Vital signs are reviewed GENERAL well-developed kyphotic thin Asian female mild distress. BACK: Kyphotic deformity. Tender to palpation along the distal thoracic in proximal lumbar area. There is some bruising here.   IMAGING: I reviewed her previous MRI which shows significant problems with prior compression fracture and him plate deformity.    Assessment & Plan:  #1. Back pain. I'm concerned for one or more compression fractures. She has been taking one tramadol twice a day and one Vicodin 5/325 every 6 hours and her pain is not controlled. We will increase her tramadol to 2 tablets every 8 hours and continue the Vicodin 5/325 one every 6 hours as needed. I discussed this with her using the translator and her daughter and referred this down for her. I will get x-rays and call her tomorrow.

## 2012-05-22 NOTE — Telephone Encounter (Signed)
Allison Rojas Her X RAY shows a new compression fracture. This is what I suspected.  Can you call the interpretor line and st up a time when either you or I can speak with her. The interpretor we used yesterday was [employer] (i think). The number is [phone].  I could speak with them any time this afternoon after 1:30 or you can speak wit them. Here is the message we want to convey: 1. You have a new compression fracture (break due to osteoporosis and the fall) in the spine 2. It is painful but not worrisome, i.e. It will hurt like crazy for 608 weeks and then get better [place]) 3. she will need to be on pain medicine for that time and I can provide that--I will call in soome refills----make sure of which pharmacy 4. I would alos consider putting her on a medicine that helps the pain grom this type of fracture--it is a once a day nose spray. It would be best if she came to see me if she has a lot of questions.. I would plan on seeing her back in 4 weeks.  THANKS! Denny Levy

## 2012-05-22 NOTE — Telephone Encounter (Signed)
Spoke with pt's daughter (who is authorized per her HIPPA form) and a friend who helped her with [place] interpretation with permission of pt and her daughter.  Gave her x-ray results and called in tramadol and vicodin per Dr. Jennette Kettle.  Scheduled pt for f/u 06/19/12 with Dr. Jennette Kettle.

## 2012-05-29 ENCOUNTER — Telehealth: Payer: Self-pay | Admitting: *Deleted

## 2012-05-29 DIAGNOSIS — M549 Dorsalgia, unspecified: Secondary | ICD-10-CM

## 2012-05-29 NOTE — Telephone Encounter (Signed)
Message copied by Jacki Cones C on Tue May 29, 2012 11:23 AM ------      Message from: [place], [place] C      Created: Mon May 28, 2012  3:45 PM      Regarding: FW: phone message      Contact: [phone]       Fax to [phone]      ----- Message -----         From: Lizbeth Bark         Sent: 05/28/2012   3:12 PM           To: Mora Bellman, RN      Subject: phone message                                            Pt daughter's states mom needs a back belt prescription. Pt will pick up prescription tomorrow.       ------

## 2012-05-29 NOTE — Telephone Encounter (Signed)
Faxed to [phone] per daughter's request

## 2012-06-08 ENCOUNTER — Other Ambulatory Visit: Payer: Self-pay | Admitting: Internal Medicine

## 2012-06-08 MED ORDER — TRAMADOL HCL 50 MG PO TABS: 50.0000 mg | ORAL_TABLET | Freq: Three times a day (TID) | ORAL | Status: DC

## 2012-06-08 MED ORDER — HYDROCODONE-ACETAMINOPHEN 5-325 MG PO TABS: 1.0000 | ORAL_TABLET | Freq: Four times a day (QID) | ORAL | Status: DC | PRN

## 2012-06-12 ENCOUNTER — Other Ambulatory Visit: Payer: Self-pay | Admitting: Internal Medicine

## 2012-06-18 ENCOUNTER — Encounter (HOSPITAL_COMMUNITY): Payer: Self-pay | Admitting: *Deleted

## 2012-06-18 ENCOUNTER — Other Ambulatory Visit: Payer: Self-pay | Admitting: *Deleted

## 2012-06-18 ENCOUNTER — Emergency Department (INDEPENDENT_AMBULATORY_CARE_PROVIDER_SITE_OTHER)
Admission: EM | Admit: 2012-06-18 | Discharge: 2012-06-18 | Disposition: A | Discharge status: Home / Self Care | Payer: Medicaid Other | Source: Home / Self Care | Attending: Family Medicine | Admitting: Family Medicine

## 2012-06-18 ENCOUNTER — Ambulatory Visit: Payer: Medicare Other | Admitting: Family Medicine

## 2012-06-18 DIAGNOSIS — K0510 Chronic gingivitis, plaque induced: Secondary | ICD-10-CM | POA: Diagnosis not present

## 2012-06-18 MED ORDER — HYDROCODONE-ACETAMINOPHEN 5-325 MG PO TABS: 1.0000 | ORAL_TABLET | Freq: Four times a day (QID) | ORAL | Status: DC | PRN

## 2012-06-18 MED ORDER — CHLORHEXIDINE GLUCONATE 0.12 % MT SOLN: 15.0000 mL | Freq: Three times a day (TID) | OROMUCOSAL | Status: DC | PRN

## 2012-06-18 MED ORDER — CHLORHEXIDINE GLUCONATE 0.12 % MT SOLN: 15.0000 mL | Freq: Three times a day (TID) | OROMUCOSAL | Status: DC

## 2012-06-18 MED ORDER — CLINDAMYCIN HCL 150 MG PO CAPS: 150.0000 mg | ORAL_CAPSULE | Freq: Four times a day (QID) | ORAL | Status: DC

## 2012-06-18 MED ORDER — CLINDAMYCIN HCL 150 MG PO CAPS: 150.0000 mg | ORAL_CAPSULE | Freq: Three times a day (TID) | ORAL | Status: DC

## 2012-06-18 NOTE — ED Provider Notes (Signed)
History     CSN: [id number]  Arrival date & time 06/18/12  1614   First MD Initiated Contact with Patient 06/18/12 1653      Chief Complaint  Patient presents with  . Dental Pain    (Consider location/radiation/quality/duration/timing/severity/associated sxs/prior treatment) Patient is a 77 y.o. female presenting with tooth pain. The history is provided by the patient and a relative. The history is limited by a language barrier. A language interpreter was used.  Dental PainThe primary symptoms include mouth pain. The symptoms began more than 1 week ago. The symptoms are worsening. The symptoms are new.  Additional symptoms include: gum swelling. Medical issues include: periodontal disease.    Past Medical History  Diagnosis Date  . Gastritis   . Rib pain   . Osteoporosis   . Insomnia   . Peptic ulcer disease   . Chronic low back pain     History reviewed. No pertinent past surgical history.  History reviewed. No pertinent family history.  History  Substance Use Topics  . Smoking status: Never Smoker   . Smokeless tobacco: Not on file  . Alcohol Use: No    OB History   Grav Para Term Preterm Abortions TAB SAB Ect Mult Living                  Review of Systems  Constitutional: Negative.   HENT: Positive for dental problem.     Allergies  Review of patient's allergies indicates no known allergies.  Home Medications   Current Outpatient Rx  Name  Route  Sig  Dispense  Refill  . alendronate (FOSAMAX) 35 MG tablet      TAKE ONE TABLET BY MOUTH EVERY 7 DAYS,TAKE WITH A FULL GLASS OF WATER ON AN EMPTY STOMACH   4 tablet   0   . alendronate (FOSAMAX) 35 MG tablet      TAKE ONE TABLET BY MOUTH EVERY 7 DAYS. TAKE WITH A FULL GLASS OF WATER ON AN EMPTY STOMACH.   4 tablet   10   . alendronate (FOSAMAX) 35 MG tablet      TAKE ONE TABLET BY MOUTH EVERY 7 DAYS,TAKE WITH A FULL GLASS OF WATER ON AN EMPTY STOMACH   4 tablet   0   . alendronate (FOSAMAX) 35  MG tablet      TAKE ONE TABLET BY MOUTH EVERY 7 DAYS. TAKE WITH A FULL GLASS OF WATER ON AN EMPTY STOMACH.   4 tablet   0   . amitriptyline (ELAVIL) 25 MG tablet   Oral   Take 1 tablet (25 mg total) by mouth at bedtime.   30 tablet   6   . calcium-vitamin D (OSCAL WITH D 500-200) 500-200 MG-UNIT per tablet   Oral   Take 2 tablets by mouth daily.           . chlorhexidine (PERIDEX) 0.12 % solution   Mouth/Throat   Use as directed 15 mLs in the mouth or throat 3 (three) times daily as needed.   240 mL   0   . clindamycin (CLEOCIN) 150 MG capsule   Oral   Take 1 capsule (150 mg total) by mouth 4 (four) times daily.   28 capsule   0   . cyclobenzaprine (FLEXERIL) 10 MG tablet      TAKE ONE-HALF TABLET BY MOUTH THREE TIMES DAILY   60 tablet   5   . fluticasone (FLONASE) 50 MCG/ACT nasal spray   Nasal  Place 2 sprays into the nose daily.   16 g   2   . gabapentin (NEURONTIN) 300 MG capsule      Take 1 capsule po at HS for 2 weeks, then 1 capsule bid .   30 capsule   2   . HYDROcodone-acetaminophen (NORCO/VICODIN) 5-325 MG per tablet   Oral   Take 1 tablet by mouth every 6 (six) hours as needed for pain.   21 tablet   0     Prescribed by sport medicine   . omeprazole (PRILOSEC) 40 MG capsule      TAKE ONE CAPSULE BY MOUTH EVERY DAY   60 capsule   10   . traMADol (ULTRAM) 50 MG tablet   Oral   Take 1 tablet (50 mg total) by mouth every 8 (eight) hours.   180 tablet   1     Prescribed by sport medicine     BP 144/82  Pulse 68  Temp(Src) 97.4 F (36.3 C) (Oral)  Resp 20  SpO2 98%  Physical Exam  Nursing note and vitals reviewed. Constitutional: She appears well-developed and well-nourished.  HENT:  Head: Normocephalic.  Right Ear: External ear normal.  Left Ear: External ear normal.  Mouth/Throat: Uvula is midline and mucous membranes are normal. Oral lesions present. Abnormal dentition. Dental caries present. No posterior oropharyngeal  edema or posterior oropharyngeal erythema.    ED Course  Procedures (including critical care time)  Labs Reviewed - No data to display No results found.   1. Chronic gingivitis       MDM          Linna Hoff, MD 06/18/12 1800

## 2012-06-18 NOTE — Progress Notes (Signed)
Pt here for appointment, did not get message from [facility] interpretor services that appt was cancelled for today.  Refilled pt's pain medication to last until her appt with Dr. Jennette Kettle Friday.

## 2012-06-18 NOTE — ED Notes (Signed)
Pt  Reports    Mouth  Pain  And  Pain  Roof of  Her  Mouth   x  1  Week              Has  Not  Seen a  Dentist      For  These  Symptoms

## 2012-06-22 ENCOUNTER — Other Ambulatory Visit: Payer: Self-pay | Admitting: Family Medicine

## 2012-06-22 ENCOUNTER — Ambulatory Visit: Payer: Medicare Other | Admitting: Family Medicine

## 2012-06-22 MED ORDER — HYDROCODONE-ACETAMINOPHEN 5-325 MG PO TABS: 1.0000 | ORAL_TABLET | Freq: Four times a day (QID) | ORAL | Status: DC | PRN

## 2012-06-22 NOTE — Progress Notes (Signed)
Spoke w her daughter---we had to reschedule (again) because of snow. She was likely going to run out of painmed for her compression fx so I called that in--she asked for HT at [facility]. Rx called in and I will see her Monday. Her Mom still in pai but pain med helps.Difficult conversation due to daughters limited English but I think we got it straight.

## 2012-06-25 ENCOUNTER — Ambulatory Visit (INDEPENDENT_AMBULATORY_CARE_PROVIDER_SITE_OTHER): Payer: Medicare Other | Admitting: Family Medicine

## 2012-06-25 ENCOUNTER — Encounter: Payer: Self-pay | Admitting: Family Medicine

## 2012-06-25 VITALS — BP 120/64

## 2012-06-25 DIAGNOSIS — M810 Age-related osteoporosis without current pathological fracture: Secondary | ICD-10-CM

## 2012-06-25 DIAGNOSIS — K2970 Gastritis, unspecified, without bleeding: Secondary | ICD-10-CM

## 2012-06-25 DIAGNOSIS — M8448XD Pathological fracture, other site, subsequent encounter for fracture with routine healing: Secondary | ICD-10-CM | POA: Diagnosis not present

## 2012-06-25 DIAGNOSIS — S22080G Wedge compression fracture of T11-T12 vertebra, subsequent encounter for fracture with delayed healing: Secondary | ICD-10-CM

## 2012-06-25 DIAGNOSIS — K2990 Gastroduodenitis, unspecified, without bleeding: Secondary | ICD-10-CM

## 2012-06-25 MED ORDER — RANITIDINE HCL 150 MG PO TABS: 150.0000 mg | ORAL_TABLET | Freq: Two times a day (BID) | ORAL | Status: DC

## 2012-06-25 MED ORDER — CALCITONIN (SALMON) 200 UNIT/ACT NA SOLN: NASAL | Status: DC

## 2012-06-26 NOTE — Progress Notes (Signed)
  Subjective:    Patient ID: Allison Rojas, female    DOB: 1935-10-12, 77 y.o.   MRN: [id number]  HPI  Followup multiple compression fractures. Pain is improved by about 25%. The entire office visit was done using translator and her daughter for medication. No true problems from the pain medicine. It makes her a little bit lethargic and some constipation. She has lost some weight.  Review of Systems His head no fevers, sweats, chills. No leg weakness.    Objective:   Physical Exam [place] 30 GENERAL: Well-developed thin female no acute distress for BACK: Nontender to palpation. She has obvious angular deformity of her back at about T12 resulting in a kyphosis. EXTREMITY: Sheri strength 5 out of 5. Gait is normal. DTRs 2+ bilaterally with the knee.       Assessment & Plan:

## 2012-06-26 NOTE — Assessment & Plan Note (Signed)
Restart her ranitidine at 150 bid as this has been helpful in the past. I considered starting proton pump inhibitor instead, but given difference in costs and history of efficacy with this in relieving her symptoms we'll try that. I did review her EGD of 2012. She is worsening stomach pain, black tarry stools, vomiting or other symptoms she knows to contact me immediately.

## 2012-06-26 NOTE — Assessment & Plan Note (Signed)
After discussion with her family we will start me and calcium for pain relief. Had planned to start this a couple of weeks ago but with schedule changes etc. I does now seeing her back. Unclear for family can afford this but they will try. On taking and whether or not we need to stop the Fosamax while she's on this. I don't think so, but if we do I'll call her. In having pharmacy check that now.

## 2012-06-26 NOTE — Assessment & Plan Note (Signed)
Her pain has improved. She had received a brace from the [employer]otech people. It has been helpful but is now causing some pain. She has lost some weight in it no longer fits her well. We gave her a different type of back brace today. I had her a refill for pain medicine. We will potentially start me a calcium. She started on vitamin D and calcium in the form of Caltrate. I'll see her back in 3-4 weeks.

## 2012-06-27 ENCOUNTER — Encounter: Payer: Medicare Other | Admitting: Internal Medicine

## 2012-07-16 ENCOUNTER — Encounter: Payer: Self-pay | Admitting: Family Medicine

## 2012-07-16 ENCOUNTER — Ambulatory Visit (INDEPENDENT_AMBULATORY_CARE_PROVIDER_SITE_OTHER): Payer: Medicare Other | Admitting: Family Medicine

## 2012-07-16 VITALS — BP 133/74 | HR 87 | Ht 60.0 in | Wt 105.0 lb

## 2012-07-16 DIAGNOSIS — S22080G Wedge compression fracture of T11-T12 vertebra, subsequent encounter for fracture with delayed healing: Secondary | ICD-10-CM

## 2012-07-16 DIAGNOSIS — M8448XD Pathological fracture, other site, subsequent encounter for fracture with routine healing: Secondary | ICD-10-CM | POA: Diagnosis not present

## 2012-07-17 ENCOUNTER — Other Ambulatory Visit: Payer: Self-pay | Admitting: Family Medicine

## 2012-07-17 MED ORDER — TRAMADOL HCL 50 MG PO TABS: 50.0000 mg | ORAL_TABLET | Freq: Three times a day (TID) | ORAL | Status: DC

## 2012-07-17 MED ORDER — CALCITONIN (SALMON) 200 UNIT/ACT NA SOLN: NASAL | Status: DC

## 2012-07-17 MED ORDER — HYDROCODONE-ACETAMINOPHEN 5-325 MG PO TABS: 1.0000 | ORAL_TABLET | Freq: Four times a day (QID) | ORAL | Status: DC | PRN

## 2012-07-17 NOTE — Progress Notes (Signed)
Allison Rojas Can u call in some refills for her tramadol and her vicodin THANKS! Denny Levy

## 2012-07-17 NOTE — Assessment & Plan Note (Addendum)
Has had significant decrease in her pain with the use of the Vicodin and tramadol. The back brace also helps. She has started the miacalcin  Also and is doing well with that. She would like to use that long-term for osteoporosis treatment. I will refill all of the above  . Long discussion with her and her daughter regarding options. Her options are limited given the extensive nature of her multilevel compression fractures, prior compression fracture with kyphosis, age and osteoporosis. 1.  I don't think  A fusion would benefit her ( huge surgery, poor surgical candidate, unlikely benefit from both functional status and from pain status---would likely still have some pain); and am not sure it would even be possible. 2.  I don't think she is a candidate for vertebral plasty. After I discussed this with her using the interpreter I think she agrees she does not really want to pursue any type of surgery or intervention, she jut\st wants the pain to improve. I think there is still a good chance of that. If not, we can still consider referral at future date to NSU or for vertebroplasty. I want to give her all options. 3  I would be willing to send her to PT-discussed today---her daughter wants her to go NOW: --she is not sure she wants to go. I suspect she may be more willing in a month or so as this hiopefully continues to iomprove.. They will call me when and if she decides to pursue this. 4. Will continue miacalcin and pain meds. RTC in 3-4 months--certainly sooner if needed. I think she will likely be closer to max improvement then and we can more fully discuss what she wants to do if her pain is still severe.

## 2012-07-17 NOTE — Progress Notes (Signed)
  Subjective:    Patient ID: Allison Rojas, female    DOB: 1935-05-18, 77 y.o.   MRN: [id number]  HPI Followup compression fracture. She is here with her daughter and with an interpreter. The interview is conducted through the interpreter  The back brace today for really seems to help. Her pain level that was 10 out of 10 and is now 7/10 at its worst. She has pain most of the day. It's worse if she stands up or does a lot of walking. She and her daughter have a lot of questions about how to "fix this". She doesn't want to be taking medicine long-term.   Review of Systems Denies incontinence.    Objective:   Physical Exam   vital signs are reviewed GENERAL: Well-developed Asian female, no acute distress. BACK: Muscle spasm bilaterally in the lower thoracic upper lumbar area. Palpation. Kyphotic.  MSK: Lower extremity strength 5 out of 5. jip flexors. GAIT: sligtky shuffling but ambulates on her own, kyphosis of back PSYCH: Alert. Good eye contact. We spoke via interpretor so limited ability on my part mto assess but I think we answered all of her questions. She seemed satisfied. LUNGS: No respiratory accessory muscle use; normal effort       Assessment & Plan:   Greater than 50% of our 45 minute office visit was spent in counseling and education regarding these issues.

## 2012-07-17 NOTE — Progress Notes (Signed)
Called in per Dr. Jennette Kettle.  Pt's daughter notified.

## 2012-08-09 ENCOUNTER — Other Ambulatory Visit: Payer: Self-pay | Admitting: *Deleted

## 2012-08-09 MED ORDER — ALENDRONATE SODIUM 35 MG PO TABS: ORAL_TABLET | ORAL | Status: DC

## 2012-08-09 NOTE — Telephone Encounter (Signed)
Last OV 08/09/11 - daughter stated she will schedule an appt but not today.

## 2012-08-10 ENCOUNTER — Emergency Department (INDEPENDENT_AMBULATORY_CARE_PROVIDER_SITE_OTHER)
Admission: EM | Admit: 2012-08-10 | Discharge: 2012-08-10 | Disposition: A | Discharge status: Other Acute Inpatient Hospital | Payer: Medicare Other | Source: Home / Self Care | Attending: Family Medicine | Admitting: Family Medicine

## 2012-08-10 ENCOUNTER — Emergency Department (HOSPITAL_COMMUNITY): Payer: Medicare Other

## 2012-08-10 ENCOUNTER — Encounter (HOSPITAL_COMMUNITY): Payer: Self-pay | Admitting: Nurse Practitioner

## 2012-08-10 ENCOUNTER — Encounter (HOSPITAL_COMMUNITY): Payer: Self-pay

## 2012-08-10 ENCOUNTER — Inpatient Hospital Stay (HOSPITAL_COMMUNITY)
Admission: EM | Admit: 2012-08-10 | Discharge: 2012-08-12 | DRG: 149 | Disposition: A | Discharge status: Home Health | Payer: Medicare Other | Attending: Internal Medicine | Admitting: Internal Medicine

## 2012-08-10 DIAGNOSIS — M8448XA Pathological fracture, other site, initial encounter for fracture: Secondary | ICD-10-CM | POA: Diagnosis present

## 2012-08-10 DIAGNOSIS — H8110 Benign paroxysmal vertigo, unspecified ear: Secondary | ICD-10-CM | POA: Diagnosis not present

## 2012-08-10 DIAGNOSIS — R112 Nausea with vomiting, unspecified: Secondary | ICD-10-CM | POA: Diagnosis present

## 2012-08-10 DIAGNOSIS — G8929 Other chronic pain: Secondary | ICD-10-CM | POA: Diagnosis present

## 2012-08-10 DIAGNOSIS — D649 Anemia, unspecified: Secondary | ICD-10-CM | POA: Diagnosis present

## 2012-08-10 DIAGNOSIS — R42 Dizziness and giddiness: Secondary | ICD-10-CM

## 2012-08-10 DIAGNOSIS — Z9181 History of falling: Secondary | ICD-10-CM | POA: Diagnosis not present

## 2012-08-10 DIAGNOSIS — E46 Unspecified protein-calorie malnutrition: Secondary | ICD-10-CM | POA: Diagnosis not present

## 2012-08-10 DIAGNOSIS — R51 Headache: Secondary | ICD-10-CM | POA: Diagnosis not present

## 2012-08-10 DIAGNOSIS — D638 Anemia in other chronic diseases classified elsewhere: Secondary | ICD-10-CM | POA: Diagnosis present

## 2012-08-10 DIAGNOSIS — M810 Age-related osteoporosis without current pathological fracture: Secondary | ICD-10-CM | POA: Diagnosis present

## 2012-08-10 DIAGNOSIS — R5383 Other fatigue: Secondary | ICD-10-CM | POA: Diagnosis not present

## 2012-08-10 DIAGNOSIS — D509 Iron deficiency anemia, unspecified: Secondary | ICD-10-CM | POA: Diagnosis present

## 2012-08-10 DIAGNOSIS — S22080A Wedge compression fracture of T11-T12 vertebra, initial encounter for closed fracture: Secondary | ICD-10-CM

## 2012-08-10 DIAGNOSIS — R5381 Other malaise: Secondary | ICD-10-CM | POA: Diagnosis not present

## 2012-08-10 DIAGNOSIS — IMO0002 Reserved for concepts with insufficient information to code with codable children: Secondary | ICD-10-CM | POA: Diagnosis not present

## 2012-08-10 DIAGNOSIS — R404 Transient alteration of awareness: Secondary | ICD-10-CM | POA: Diagnosis not present

## 2012-08-10 DIAGNOSIS — R079 Chest pain, unspecified: Secondary | ICD-10-CM | POA: Diagnosis not present

## 2012-08-10 DIAGNOSIS — R0789 Other chest pain: Secondary | ICD-10-CM | POA: Diagnosis not present

## 2012-08-10 DIAGNOSIS — F1190 Opioid use, unspecified, uncomplicated: Secondary | ICD-10-CM | POA: Diagnosis present

## 2012-08-10 HISTORY — DX: Cardiac arrhythmia, unspecified: I49.9

## 2012-08-10 HISTORY — DX: Unspecified fall, initial encounter: W19.XXXA

## 2012-08-10 HISTORY — DX: Reserved for concepts with insufficient information to code with codable children: IMO0002

## 2012-08-10 LAB — URINALYSIS, ROUTINE W REFLEX MICROSCOPIC
Glucose, UA: NEGATIVE mg/dL
Protein, ur: NEGATIVE mg/dL
Specific Gravity, Urine: 1.008 (ref 1.005–1.030)
pH: 6 (ref 5.0–8.0)

## 2012-08-10 LAB — CK TOTAL AND CKMB (NOT AT ARMC)
CK, MB: 2.3 ng/mL (ref 0.3–4.0)
Total CK: 50 U/L (ref 7–177)

## 2012-08-10 LAB — CBC WITH DIFFERENTIAL/PLATELET
Eosinophils Absolute: 0 10*3/uL (ref 0.0–0.7)
Hemoglobin: 11.5 g/dL — ABNORMAL LOW (ref 12.0–15.0)
Lymphocytes Relative: 24 % (ref 12–46)
Lymphs Abs: 1.2 10*3/uL (ref 0.7–4.0)
MCH: 30.4 pg (ref 26.0–34.0)
MCV: 86.5 fL (ref 78.0–100.0)
Monocytes Relative: 4 % (ref 3–12)
Neutrophils Relative %: 71 % (ref 43–77)
RBC: 3.78 MIL/uL — ABNORMAL LOW (ref 3.87–5.11)

## 2012-08-10 LAB — BASIC METABOLIC PANEL
BUN: 17 mg/dL (ref 6–23)
CO2: 28 mEq/L (ref 19–32)
GFR calc non Af Amer: 83 mL/min — ABNORMAL LOW (ref >90)
Glucose, Bld: 115 mg/dL — ABNORMAL HIGH (ref 70–99)
Potassium: 3.6 mEq/L (ref 3.5–5.1)

## 2012-08-10 LAB — URINE MICROSCOPIC-ADD ON

## 2012-08-10 MED ORDER — CALCITONIN (SALMON) 200 UNIT/ACT NA SOLN
1.0000 | Freq: Every day | NASAL | Status: DC
  Administered 2012-08-11 – 2012-08-12 (×2): 1 via NASAL
  Filled 2012-08-10: qty 3.7

## 2012-08-10 MED ORDER — FLUTICASONE PROPIONATE 50 MCG/ACT NA SUSP
2.0000 | Freq: Every day | NASAL | Status: DC
  Administered 2012-08-11 – 2012-08-12 (×2): 2 via NASAL
  Filled 2012-08-10: qty 16

## 2012-08-10 MED ORDER — SODIUM CHLORIDE 0.9 % IV SOLN
Freq: Once | INTRAVENOUS | Status: AC
  Administered 2012-08-10: 17:00:00 via INTRAVENOUS

## 2012-08-10 MED ORDER — VITAMIN C 250 MG PO TABS
250.0000 mg | ORAL_TABLET | Freq: Every day | ORAL | Status: DC
  Administered 2012-08-11 – 2012-08-12 (×2): 250 mg via ORAL
  Filled 2012-08-10 (×2): qty 1

## 2012-08-10 MED ORDER — PANTOPRAZOLE SODIUM 40 MG PO TBEC
40.0000 mg | DELAYED_RELEASE_TABLET | Freq: Every day | ORAL | Status: DC
  Administered 2012-08-11 – 2012-08-12 (×2): 40 mg via ORAL
  Filled 2012-08-10 (×2): qty 1

## 2012-08-10 MED ORDER — ONDANSETRON HCL 4 MG/2ML IJ SOLN: 4.0000 mg | Freq: Four times a day (QID) | INTRAMUSCULAR | Status: DC | PRN

## 2012-08-10 MED ORDER — VITAMIN D3 25 MCG (1000 UNIT) PO TABS
1000.0000 [IU] | ORAL_TABLET | Freq: Every day | ORAL | Status: DC
  Administered 2012-08-11 – 2012-08-12 (×2): 1000 [IU] via ORAL
  Filled 2012-08-10 (×2): qty 1

## 2012-08-10 MED ORDER — SODIUM CHLORIDE 0.9 % IJ SOLN
3.0000 mL | Freq: Two times a day (BID) | INTRAMUSCULAR | Status: DC
  Administered 2012-08-11 – 2012-08-12 (×2): 3 mL via INTRAVENOUS

## 2012-08-10 MED ORDER — SODIUM CHLORIDE 0.9 % IJ SOLN: 3.0000 mL | INTRAMUSCULAR | Status: DC | PRN

## 2012-08-10 MED ORDER — HEPARIN SODIUM (PORCINE) 5000 UNIT/ML IJ SOLN
5000.0000 [IU] | Freq: Three times a day (TID) | INTRAMUSCULAR | Status: DC
  Administered 2012-08-10 – 2012-08-12 (×5): 5000 [IU] via SUBCUTANEOUS
  Filled 2012-08-10 (×8): qty 1

## 2012-08-10 MED ORDER — ONDANSETRON HCL 4 MG/2ML IJ SOLN
4.0000 mg | Freq: Once | INTRAMUSCULAR | Status: AC
  Administered 2012-08-10: 4 mg via INTRAVENOUS

## 2012-08-10 MED ORDER — TRAMADOL HCL 50 MG PO TABS
50.0000 mg | ORAL_TABLET | Freq: Three times a day (TID) | ORAL | Status: DC
  Administered 2012-08-10 – 2012-08-12 (×4): 50 mg via ORAL
  Filled 2012-08-10 (×6): qty 1

## 2012-08-10 MED ORDER — DEXTROSE-NACL 5-0.9 % IV SOLN
INTRAVENOUS | Status: DC
  Administered 2012-08-10: 22:00:00 via INTRAVENOUS
  Administered 2012-08-11: 100 mL via INTRAVENOUS
  Administered 2012-08-12: 04:00:00 via INTRAVENOUS

## 2012-08-10 MED ORDER — CYCLOBENZAPRINE HCL 10 MG PO TABS
10.0000 mg | ORAL_TABLET | Freq: Three times a day (TID) | ORAL | Status: DC | PRN
  Filled 2012-08-10: qty 1

## 2012-08-10 MED ORDER — HYDROCODONE-ACETAMINOPHEN 5-325 MG PO TABS: 1.0000 | ORAL_TABLET | Freq: Four times a day (QID) | ORAL | Status: DC | PRN

## 2012-08-10 MED ORDER — MECLIZINE HCL 12.5 MG PO TABS
12.5000 mg | ORAL_TABLET | Freq: Three times a day (TID) | ORAL | Status: DC | PRN
  Filled 2012-08-10 (×2): qty 1

## 2012-08-10 MED ORDER — SODIUM CHLORIDE 0.9 % IJ SOLN
3.0000 mL | Freq: Two times a day (BID) | INTRAMUSCULAR | Status: DC
  Administered 2012-08-10 – 2012-08-12 (×3): 3 mL via INTRAVENOUS

## 2012-08-10 MED ORDER — SODIUM CHLORIDE 0.9 % IV SOLN: 250.0000 mL | INTRAVENOUS | Status: DC | PRN

## 2012-08-10 NOTE — ED Provider Notes (Signed)
History     CSN: [id number]  Arrival date & time 08/10/12  1726   First MD Initiated Contact with Patient 08/10/12 1733      Chief Complaint  Patient presents with  . Chest Pain    (Consider location/radiation/quality/duration/timing/severity/associated sxs/prior treatment) HPI.....Marland Kitchen level V caveat for dizziness,  Speech barrier.   History obtained from daughter. Daughter reports dizziness, vomiting, mild chest discomfort with radiation to right arm since approximately 2 AM.   She took 2 Excedrin which seemed to help.  Past Medical History  Diagnosis Date  . Gastritis   . Rib pain   . Osteoporosis   . Insomnia   . Peptic ulcer disease   . Chronic low back pain   . Irregular heart rate     History reviewed. No pertinent past surgical history.  No family history on file.  History  Substance Use Topics  . Smoking status: Never Smoker   . Smokeless tobacco: Not on file  . Alcohol Use: No    OB History   Grav Para Term Preterm Abortions TAB SAB Ect Mult Living                  Review of Systems  Unable to perform ROS: Other    Allergies  Review of patient's allergies indicates no known allergies.  Home Medications   Current Outpatient Rx  Name  Route  Sig  Dispense  Refill  . alendronate (FOSAMAX) 35 MG tablet   Oral   Take 35 mg by mouth every 7 (seven) days. Take with a full glass of water on an empty stomach. Takes on Wed         . Ascorbic Acid (VITAMIN C PO)   Oral   Take 1 tablet by mouth daily.         . calcitonin, salmon, (MIACALCIN/FORTICAL) 200 UNIT/ACT nasal spray   Nasal   Place 1 spray into the nose daily. Alternate nostril ea day         . Cholecalciferol (VITAMIN D PO)   Oral   Take 1 tablet by mouth daily.         . cyclobenzaprine (FLEXERIL) 10 MG tablet   Oral   Take 10 mg by mouth 3 (three) times daily as needed for muscle spasms. For muscle spasms         . fluticasone (FLONASE) 50 MCG/ACT nasal spray   Nasal  Place 2 sprays into the nose daily as needed for rhinitis. For allergies         . HYDROcodone-acetaminophen (NORCO/VICODIN) 5-325 MG per tablet   Oral   Take 1 tablet by mouth every 6 (six) hours as needed for pain.   120 tablet   3     Prescribed by sport medicine   . omeprazole (PRILOSEC) 40 MG capsule   Oral   Take 40 mg by mouth daily.         . ranitidine (ZANTAC) 150 MG tablet   Oral   Take 150 mg by mouth 2 (two) times daily as needed. For heartburn         . traMADol (ULTRAM) 50 MG tablet   Oral   Take 1 tablet (50 mg total) by mouth every 8 (eight) hours.   180 tablet   3     Prescribed by sport medicine     BP 131/62  Pulse 72  Temp(Src) 98.8 F (37.1 C) (Oral)  Resp 15  SpO2 99%  Physical Exam  Nursing note and vitals reviewed. Constitutional:  Patient speaks [place]; she is lying prone on her back  HENT:  Head: Normocephalic and atraumatic.  Eyes: Conjunctivae and EOM are normal. Pupils are equal, round, and reactive to light.  Neck: Normal range of motion. Neck supple.  Cardiovascular: Normal rate, regular rhythm and normal heart sounds.   Pulmonary/Chest: Effort normal and breath sounds normal.  Abdominal: Soft. Bowel sounds are normal.  Musculoskeletal: Normal range of motion.  Neurological: She is alert.  Moves all 4 extremities  Skin: Skin is warm and dry.  Psychiatric:  Flat affect    ED Course  Procedures (including critical care time)  Labs Reviewed  CBC WITH DIFFERENTIAL - Abnormal; Notable for the following:    RBC 3.78 (*)    Hemoglobin 11.5 (*)    HCT 32.7 (*)    All other components within normal limits  BASIC METABOLIC PANEL - Abnormal; Notable for the following:    Glucose, Bld 115 (*)    GFR calc non Af Amer 83 (*)    All other components within normal limits  TROPONIN I  URINALYSIS, ROUTINE W REFLEX MICROSCOPIC   Ct Head Wo Contrast  08/10/2012  *RADIOLOGY REPORT*  Clinical Data: Headache and nausea.  Chest  pain.  CT HEAD WITHOUT CONTRAST  Technique:  Contiguous axial images were obtained from the base of the skull through the vertex without contrast.  Comparison: No priors.  Findings: Mild cerebral and cerebellar atrophy.  There are some patchy areas of decreased attenuation throughout the deep and periventricular white matter of the cerebral hemispheres bilaterally, compatible with mild chronic microvascular ischemic disease.  No acute intracranial abnormalities.  Specifically, no evidence of acute intracranial hemorrhage, no definite findings of acute/subacute cerebral ischemia, no mass, mass effect, hydrocephalus or abnormal intra or extra-axial fluid collections. Visualized paranasal sinuses and mastoids are well pneumatized.  No acute displaced skull fractures are identified.  IMPRESSION: 1.  No acute intracranial abnormalities. 2.  Mild cerebral and cerebellar atrophy with mild chronic microvascular ischemic disease of the white matter.   Original Report Authenticated By: Trudie Reed, M.D.    Dg Chest Port 1 View  08/10/2012  *RADIOLOGY REPORT*  Clinical Data: Chest pain and nausea  PORTABLE CHEST - 1 VIEW  Comparison: 05/21/2012 thoracic spine radiographs  Findings: The cardiomediastinal silhouette is unremarkable. Elevation of the right hemidiaphragm is again noted. There is no evidence of focal airspace disease, pulmonary edema, suspicious pulmonary nodule/mass, pleural effusion, or pneumothorax. No acute bony abnormalities are identified.  IMPRESSION: No evidence of acute cardiopulmonary disease.   Original Report Authenticated By: Harmon Pier, M.D.     Date: 08/10/2012  Rate: 73  Rhythm: normal sinus rhythm  QRS Axis: normal  Intervals: normal  ST/T Wave abnormalities: normal  Conduction Disutrbances: none  Narrative Interpretation: unremarkable      1. Dizziness       MDM  Uncertain etiology of symptom complex. Head CT negative.  EKG normal.  Troponin negative.  Admit to internal  medicine teaching service        Donnetta Hutching, MD 08/10/12 2014

## 2012-08-10 NOTE — ED Notes (Signed)
Per EMS pt tx from [facility]- c/o vomiting, dizziness and discomfort in chest. Pt woke up at [facility] and took Excedrin for right sided arm pain with relief.

## 2012-08-10 NOTE — H&P (Signed)
Hospital Admission Note Date: 08/11/2012  Patient name: Allison Rojas Medical record number: [id number] Date of birth: 1936-04-03 Age: 77 y.o. Gender: female PCP: Carrolyn Meiers, MD  Medical Service: Internal Medicine Attending physician: Dr. Rogelia Boga    1st Contact: Dr. Collier Bullock [phone] 2nd Contact: Dr. Dorise Hiss Pager:[phone] After 5 pm or weekends: 1st Contact: Pager: [phone] 2nd Contact: Pager: [phone]  Chief Complaint: Dizziness, nausea  History of Present Illness: Patient speaks [place] interpretation used through daughter.  45 y.o [place] woman presents with worsening dizziness and nausea (now improving) on day of admission.  She is having positional dizziness, worsening dizziness, which started weeks ago but worsening for the last week and even worse on day of admission.  She denies h/a, she did have a fall in January or Feb 2014 leading to a T12 compression fracture (with h/o osteoporosis) but denies recent falls.  Daughter reports abnormal gait on day of admission. Other review of systems positive: "engine noise in right ear for years", hearing loss in right ear for years, vomiting x 1 episode of day of admission, decreased oral intake on day of admission, decreased appetite (chronic x 10 years), weight loss (5kg), chronic back pain.  Also, she mentions that she woke up at 3 am with right arm pain (now resolved after taking Excedrin and sleeping this morning).   Meds: Medications Prior to Admission  Medication Sig Dispense Refill  . alendronate (FOSAMAX) 35 MG tablet Take 35 mg by mouth every 7 (seven) days. Take with a full glass of water on an empty stomach. Takes on Wed      . Ascorbic Acid (VITAMIN C PO) Take 1 tablet by mouth daily.      . calcitonin, salmon, (MIACALCIN/FORTICAL) 200 UNIT/ACT nasal spray Place 1 spray into the nose daily. Alternate nostril ea day      . Cholecalciferol (VITAMIN D PO) Take 1 tablet by mouth daily.      . cyclobenzaprine (FLEXERIL) 10 MG tablet Take  10 mg by mouth 3 (three) times daily as needed for muscle spasms. For muscle spasms      . fluticasone (FLONASE) 50 MCG/ACT nasal spray Place 2 sprays into the nose daily as needed for rhinitis. For allergies      . HYDROcodone-acetaminophen (NORCO/VICODIN) 5-325 MG per tablet Take 1 tablet by mouth every 6 (six) hours as needed for pain.  120 tablet  3  . omeprazole (PRILOSEC) 40 MG capsule Take 40 mg by mouth daily.      . ranitidine (ZANTAC) 150 MG tablet Take 150 mg by mouth 2 (two) times daily as needed. For heartburn      . traMADol (ULTRAM) 50 MG tablet Take 1 tablet (50 mg total) by mouth every 8 (eight) hours.  180 tablet  3   Allergies: Allergies as of 08/10/2012 - Review Complete 08/10/2012  Allergen Reaction Noted  . Amitriptyline  08/10/2012   Past Medical History  Diagnosis Date  . Gastritis   . Rib pain   . Osteoporosis   . Insomnia   . Peptic ulcer disease   . Chronic low back pain   . Irregular heart rate   . Compression fracture     T9 and T12 noted 05/2012 imaging. Followed by pain clinic   . Fall     Jan or Feb 2014    History reviewed. No pertinent past surgical history.-Denies surgeries  No family history on file.-Patient does not know her family  History   Social History  . Marital Status:  Widowed    Spouse Name: N/A    Number of Children: N/A  . Years of Education: N/A   Occupational History  . Not on file.   Social History Main Topics  . Smoking status: Never Smoker   . Smokeless tobacco: Not on file  . Alcohol Use: No  . Drug Use: No  . Sexually Active: Not on file   Other Topics Concern  . Not on file   Social History Narrative   Financial assistance approved for 100% discount at [facility] and has [facility] card per [name]   11/30/2009   She is widowed, she has 2 children (only admits to 1 daughter now 07/2012), she does not smoke cigarettes or drink alcohol   Lives with daughter Allison Rojas [phone]                Review of  Systems: General: decreased appetite (chronic x 10 years), decreased oral intake (today), weight loss (5kg), denies fever/chills HEENT: denies dysphagia, denies vision problems, "engine noise in right ear for years", denies ear pain, hearing loss in right ear for years  Cardiac: denies chest pain  Pulm: denies sob or cough Abd/GU: +nausea (improving), +vomiting x 1, denies abdominal pain, denies constipation, denies dysuria  MSK: right arm pain (resolved; woke her up at 3 am she took Excedrin slept and woke up again 8 am), chronic back pain  Ext: denies pedal edema Neuro: +positional dizziness, worsening dizziness (started weeks ago but worsening for the last week and even worse on day of admission), denies h/a, +fall in January or Feb 2014-->T12 compression fracture but no recent falls, +abnormal gait, denies numbness in hands, b/l fingers cold and weak for a few days Psych: denies depression, +decreased sleep    Physical Exam: 74/100 on room air, 15, 150/70 Blood pressure 131/58, pulse 66, temperature 98 F (36.7 C), temperature source Oral, resp. rate 18, height 5' (1.524 m), weight 99 lb 1.6 oz (44.951 kg), SpO2 97.00%. Vitals reviewed. General: resting in bed, NAD, alert and oriented, thin HEENT: PERRL b/l, no scleral icterus, wet oral mucosa, b/l ears normal TM, no exudate  Cardiac: RRR, no rubs, murmurs or gallops Pulm: clear to auscultation bilaterally, no wheezes, rales, or rhonchi Abd: soft, nontender, nondistended, BS present Ext: warm and well perfused, no pedal edema MSK: deformity in thoracic spine due to compression fracture.   Neuro: alert and oriented X3, neurologically intact, normal strength and sensation upper and lower extremities b/l, intact rhomberg and finger to nose though dizziness noted on exam with lying to sitting position. No nystagmus noted   Lab results: Basic Metabolic Panel:  Recent Labs  09/81/19 1735  NA 138  K 3.6  CL 100  CO2 28  GLUCOSE 115*   BUN 17  CREATININE 0.72  CALCIUM 9.4   Liver Function Tests: No results found for this basename: AST, ALT, ALKPHOS, BILITOT, PROT, ALBUMIN,  in the last 72 hours No results found for this basename: LIPASE, AMYLASE,  in the last 72 hours No results found for this basename: AMMONIA,  in the last 72 hours CBC:  Recent Labs  08/10/12 1735  WBC 5.0  NEUTROABS 3.6  HGB 11.5*  HCT 32.7*  MCV 86.5  PLT 207   Cardiac Enzymes:  Recent Labs  08/10/12 1735 08/10/12 2156  CKTOTAL  --  50  CKMB  --  2.3  TROPONINI <0.30  --    Urinalysis:  Recent Labs  08/10/12 2012  COLORURINE  YELLOW  LABSPEC 1.008  PHURINE 6.0  GLUCOSEU NEGATIVE  HGBUR MODERATE*  BILIRUBINUR NEGATIVE  KETONESUR 15*  PROTEINUR NEGATIVE  UROBILINOGEN 0.2  NITRITE NEGATIVE  LEUKOCYTESUR NEGATIVE   Misc. Labs: TSH CK, CKMB Trop CMET  Imaging results:  Ct Head Wo Contrast  08/10/2012  *RADIOLOGY REPORT*  Clinical Data: Headache and nausea.  Chest pain.  CT HEAD WITHOUT CONTRAST  Technique:  Contiguous axial images were obtained from the base of the skull through the vertex without contrast.  Comparison: No priors.  Findings: Mild cerebral and cerebellar atrophy.  There are some patchy areas of decreased attenuation throughout the deep and periventricular white matter of the cerebral hemispheres bilaterally, compatible with mild chronic microvascular ischemic disease.  No acute intracranial abnormalities.  Specifically, no evidence of acute intracranial hemorrhage, no definite findings of acute/subacute cerebral ischemia, no mass, mass effect, hydrocephalus or abnormal intra or extra-axial fluid collections. Visualized paranasal sinuses and mastoids are well pneumatized.  No acute displaced skull fractures are identified.  IMPRESSION: 1.  No acute intracranial abnormalities. 2.  Mild cerebral and cerebellar atrophy with mild chronic microvascular ischemic disease of the white matter.   Original Report  Authenticated By: Trudie Reed, M.D.    Dg Chest Port 1 View  08/10/2012  *RADIOLOGY REPORT*  Clinical Data: Chest pain and nausea  PORTABLE CHEST - 1 VIEW  Comparison: 05/21/2012 thoracic spine radiographs  Findings: The cardiomediastinal silhouette is unremarkable. Elevation of the right hemidiaphragm is again noted. There is no evidence of focal airspace disease, pulmonary edema, suspicious pulmonary nodule/mass, pleural effusion, or pneumothorax. No acute bony abnormalities are identified.  IMPRESSION: No evidence of acute cardiopulmonary disease.   Original Report Authenticated By: Harmon Pier, M.D.     Other results: EKG: HR 73, NSR, normal axis and intervals, normal ST/T  Assessment & Plan by Problem: 77 y.o woman presents with worsening dizziness and nausea.   1. Vertigo, positional dizziness -Patient's symptoms are consistent with vertigo and she has positional dizziness. DDx would include both central and peripheral vertigo. It seems to be benign paroxysmal positional vertigo (patient reports worsening dizziness on position change). Other peripheral vertigo DD includes Mnire's disease (less likely, though she has tinnitus and hearing loss, it is chronic), vestibular neuritis (less likely, patient denies recent viral illness) and vestibular schwannoma (can't be ruled out completely. She has chronic tinnitus and hearing loss), labyrinthine concussion (can't be ruled out, patient had a bad fall two month go). Central vertigo includes two possibilities, head injury and stroke from basilar circulation system. So far, hemorrhagic stroke is unlikely given negative CT-head. Ischemic stroke cannot be ruled out, but less likely given lack of focal neurological findings. Other possibility is drug induced vertigo, such her Flexeril and Norco, Ultram. All these medications can cause dizziness.  Patient's BMP is normal, ruling out electrolyte disturbance as the etiology.   Plan:  - Patient will be  admitted to telemetry bed  - Falls precaution -will treat symptomatically with Zofran for nausea and meclizine for dizziness. If no improvement, may consider to get MRI-brain without contrast  - NPO and IV D5-NS 100 cc/h  - EKG, troponinX2 (one troponin negative so far)  -PT/OT Vestibular rehab  -pending orthostatics if the patient can tolerate   2. Nausea and vomiting -improving prn Zofran 4 mg q6   3. Normocytic anemia  -Hbg 11.5 on admission.  Appears at baseline   4. Osteoporosis  -Discontinued Fosamax due to patient patient having positional dizziness and back pain  not allowing her to sit up and take the medication properly  -Continue Calcitonin nasal spray  -with associated fall and compression fracture   5. Back pain  -Fall Jan. Or Feb 2014 with history of osteoporosis causing compression fracture T12.  Patient is in significant pain per outpatient notes no surgery recommended at this time.  Following with pain clinic for management and narcotics  -Continue Norco 5-325 mg q6, Ultram 50 mg q8, Flexeril 10 mg tid thought these medications may be contributing to dizziness which needs to be re-evaluated   6. Hematuria  -noted on admission UA -will repeat UA in the am if + needs further workup   7. F/E/N -D5 1/2 NS 100 cc/hr -will monitor and replace electrolytes prn  -NPO except med s  8. DVT Px  -Heparin sq   Dispo: Disposition is deferred at this time, awaiting improvement of current medical problems. Anticipated discharge in approximately 1-2 day(s).   The patient does have a current PCP (HO,MICHELE, MD), therefore will be requiring OPC follow-up after discharge.   The patient does not have transportation limitations that hinder transportation to clinic appointments.  SignedAnnett Gula [phone] 08/11/2012, 12:43 AM

## 2012-08-10 NOTE — ED Provider Notes (Addendum)
History     CSN: [id number]  Arrival date & time 08/10/12  1528   First MD Initiated Contact with Patient 08/10/12 1554      Chief Complaint  Patient presents with  . Arm Pain    right     (Consider location/radiation/quality/duration/timing/severity/associated sxs/prior treatment) Patient is a 77 y.o. female presenting with arm pain. The history is provided by the patient and a relative. The history is limited by a language barrier.  Arm Pain This is a new problem. The current episode started 6 to 12 hours ago. The problem has been gradually worsening. Pertinent negatives include no abdominal pain, no headaches and no shortness of breath. Associated symptoms comments: Dizziness, n/v since this am, has had prior similar brief episodes.also with cmprn fx of spine., wt loss poor appetite..    Past Medical History  Diagnosis Date  . Gastritis   . Rib pain   . Osteoporosis   . Insomnia   . Peptic ulcer disease   . Chronic low back pain     History reviewed. No pertinent past surgical history.  No family history on file.  History  Substance Use Topics  . Smoking status: Never Smoker   . Smokeless tobacco: Not on file  . Alcohol Use: No    OB History   Grav Para Term Preterm Abortions TAB SAB Ect Mult Living                  Review of Systems  Constitutional: Positive for activity change and appetite change.  Respiratory: Negative for shortness of breath.   Gastrointestinal: Positive for nausea and vomiting. Negative for abdominal pain, diarrhea and constipation.  Musculoskeletal: Positive for back pain.  Neurological: Positive for dizziness. Negative for weakness, numbness and headaches.    Allergies  Review of patient's allergies indicates no known allergies.  Home Medications   Current Outpatient Rx  Name  Route  Sig  Dispense  Refill  . alendronate (FOSAMAX) 35 MG tablet      TAKE ONE TABLET BY MOUTH EVERY 7 DAYS. TAKE WITH A FULL GLASS OF WATER ON AN  EMPTY STOMACH.   4 tablet   3   . calcitonin, salmon, (MIACALCIN) 200 UNIT/ACT nasal spray      Alternate nostril ea day   3.7 mL   12   . calcium-vitamin D (OSCAL WITH D 500-200) 500-200 MG-UNIT per tablet   Oral   Take 2 tablets by mouth daily.           . chlorhexidine (PERIDEX) 0.12 % solution   Mouth/Throat   Use as directed 15 mLs in the mouth or throat 3 (three) times daily as needed.   240 mL   0   . clindamycin (CLEOCIN) 150 MG capsule   Oral   Take 1 capsule (150 mg total) by mouth 4 (four) times daily.   28 capsule   0   . EXPIRED: fluticasone (FLONASE) 50 MCG/ACT nasal spray   Nasal   Place 2 sprays into the nose daily.   16 g   2   . gabapentin (NEURONTIN) 300 MG capsule      Take 1 capsule po at HS for 2 weeks, then 1 capsule bid .   30 capsule   2   . HYDROcodone-acetaminophen (NORCO/VICODIN) 5-325 MG per tablet   Oral   Take 1 tablet by mouth every 6 (six) hours as needed for pain.   120 tablet   3  Prescribed by sport medicine   . ranitidine (ZANTAC) 150 MG tablet   Oral   Take 1 tablet (150 mg total) by mouth 2 (two) times daily.   60 tablet   2   . traMADol (ULTRAM) 50 MG tablet   Oral   Take 1 tablet (50 mg total) by mouth every 8 (eight) hours.   180 tablet   3     Prescribed by sport medicine     BP 154/79  Pulse 93  Temp(Src) 98.1 F (36.7 C) (Oral)  Resp 15  SpO2 96%  Physical Exam  Nursing note and vitals reviewed. Constitutional: She is oriented to person, place, and time. She appears well-developed and well-nourished. She appears distressed.  HENT:  Head: Normocephalic.  Mouth/Throat: Oropharynx is clear and moist.  Eyes: Conjunctivae and EOM are normal. Pupils are equal, round, and reactive to light.  Neck: Normal range of motion. Neck supple.  Cardiovascular: Normal rate and regular rhythm.   Pulmonary/Chest: Breath sounds normal.  Abdominal: Soft. Bowel sounds are normal. She exhibits no distension and  no mass. There is no hepatosplenomegaly. There is tenderness in the epigastric area. There is no rigidity, no rebound, no guarding and no CVA tenderness.  Lymphadenopathy:    She has no cervical adenopathy.  Neurological: She is alert and oriented to person, place, and time.  Skin: Skin is warm and dry.    ED Course  Procedures (including critical care time)  Labs Reviewed - No data to display No results found.   1. Vertigo       MDM  Sent for possible vertigo with weakness and vomiting. ecg--wnl.       Linna Hoff, MD 08/10/12 1650  Linna Hoff, MD 08/10/12 [phone]

## 2012-08-10 NOTE — ED Notes (Signed)
Daughter explains her mom woke up this am with right arm very sore layed down for a while woke up dizzy and nausea Denies any fever Arms feel flimsy

## 2012-08-10 NOTE — ED Notes (Signed)
EMS here to transport to ED, good condition on transport

## 2012-08-10 NOTE — ED Notes (Addendum)
Daughter tells me pt has felt badly x 1 week, w dizziness, nausea, pain in right arm; woke this Am ~3 w pain, took OTC medication, slept until 8, still having pain, nausea, arms "clumsey", dizzy, not eating; pale, ?tearful (stoic) vomited aprox 250 ml undigested food just post triage; normal EKG shown to dr Artis Flock; reportedly not eating, loosing weight

## 2012-08-11 DIAGNOSIS — E46 Unspecified protein-calorie malnutrition: Secondary | ICD-10-CM | POA: Diagnosis not present

## 2012-08-11 DIAGNOSIS — H8110 Benign paroxysmal vertigo, unspecified ear: Secondary | ICD-10-CM | POA: Diagnosis not present

## 2012-08-11 DIAGNOSIS — R42 Dizziness and giddiness: Secondary | ICD-10-CM | POA: Diagnosis present

## 2012-08-11 DIAGNOSIS — R112 Nausea with vomiting, unspecified: Secondary | ICD-10-CM | POA: Diagnosis present

## 2012-08-11 DIAGNOSIS — M8448XA Pathological fracture, other site, initial encounter for fracture: Secondary | ICD-10-CM | POA: Diagnosis not present

## 2012-08-11 LAB — CBC
HCT: 29.4 % — ABNORMAL LOW (ref 36.0–46.0)
Hemoglobin: 10.4 g/dL — ABNORMAL LOW (ref 12.0–15.0)
MCH: 30.7 pg (ref 26.0–34.0)
RDW: 13.9 % (ref 11.5–15.5)
WBC: 4.4 10*3/uL (ref 4.0–10.5)

## 2012-08-11 LAB — VITAMIN B12: Vitamin B-12: 798 pg/mL (ref 211–911)

## 2012-08-11 LAB — IRON AND TIBC
Saturation Ratios: 31 % (ref 20–55)
TIBC: 267 ug/dL (ref 250–470)

## 2012-08-11 LAB — COMPREHENSIVE METABOLIC PANEL
ALT: 6 U/L (ref 0–35)
AST: 23 U/L (ref 0–37)
Albumin: 3.3 g/dL — ABNORMAL LOW (ref 3.5–5.2)
Alkaline Phosphatase: 51 U/L (ref 39–117)
BUN: 13 mg/dL (ref 6–23)
Calcium: 8.4 mg/dL (ref 8.4–10.5)
Chloride: 106 mEq/L (ref 96–112)
GFR calc Af Amer: >90 mL/min (ref >90)
Glucose, Bld: 114 mg/dL — ABNORMAL HIGH (ref 70–99)
Sodium: 141 mEq/L (ref 135–145)
Total Protein: 6.1 g/dL (ref 6.0–8.3)

## 2012-08-11 LAB — TROPONIN I: Troponin I: <0.3 ng/mL (ref <0.30)

## 2012-08-11 LAB — RETICULOCYTES: Retic Ct Pct: 1.2 % (ref 0.4–3.1)

## 2012-08-11 MED ORDER — ONDANSETRON HCL 4 MG PO TABS: 4.0000 mg | ORAL_TABLET | Freq: Three times a day (TID) | ORAL | Status: DC | PRN

## 2012-08-11 MED ORDER — DICLOFENAC SODIUM 1 % TD GEL
4.0000 g | Freq: Four times a day (QID) | TRANSDERMAL | Status: DC
  Filled 2012-08-11: qty 100

## 2012-08-11 MED ORDER — ONDANSETRON HCL 4 MG/2ML IJ SOLN: 4.0000 mg | Freq: Four times a day (QID) | INTRAMUSCULAR | Status: DC | PRN

## 2012-08-11 MED ORDER — MIRTAZAPINE 7.5 MG PO TABS
7.5000 mg | ORAL_TABLET | Freq: Every day | ORAL | Status: DC
  Administered 2012-08-11: 7.5 mg via ORAL
  Filled 2012-08-11 (×2): qty 1

## 2012-08-11 NOTE — Evaluation (Addendum)
Physical Therapy Evaluation Patient Details Name: Allison Rojas MRN: [id number] DOB: 03/21/1936 Today's Date: 08/11/2012 Time: 4098-1191 PT Time Calculation (min): 34 min  PT Assessment / Plan / Recommendation Clinical Impression  Patient is a 77 yo female admitted with dizziness, nausea, and anemia.  Initiated vestibular evaluation.  Tested smooth pursuits, saccades, and VOR all negative.  Barrel roll negative to both sides.  Modified Hallpike-Dix was negative to left, but positive to right with nystagmus and dizziness of 4/5 for 15 seconds - indicating Rt BPPV.  Performed Epley maneuver, and had patient remain upright with HOB elevated.  Will complete evaluation in am for mobility.  Daughter states concern of patient being alone while she is at work.      PT Assessment  Patient needs continued PT services    Follow Up Recommendations  Home health PT;Supervision - Intermittent    Does the patient have the potential to tolerate intense rehabilitation      Barriers to Discharge Decreased caregiver support Daughter works    Furniture conservator/restorer  Other (comment) (TBD)    Recommendations for Other Services     Frequency Min 4X/week    Precautions / Restrictions Precautions Precautions: Fall Precaution Comments: Fall 2 months ago with fx vertebra per daughter Restrictions Weight Bearing Restrictions: No   Pertinent Vitals/Pain       Mobility  Bed Mobility Bed Mobility: Rolling Right;Rolling Left;Right Sidelying to Sit;Left Sidelying to Sit Rolling Right: 6: Modified independent (Device/Increase time);With rail Rolling Left: 6: Modified independent (Device/Increase time);With rail Right Sidelying to Sit: 4: Min assist;HOB flat Left Sidelying to Sit: 4: Min assist;HOB flat Details for Bed Mobility Assistance: Verbal cues for technique.  Assist to raise trunk off of bed.  Performed barrel rolls with no nystagmus noted to either side. Transfers Transfers: Not assessed     Exercises     PT Diagnosis: Abnormality of gait (Dizziness)  PT Problem List: Decreased activity tolerance;Decreased mobility (Dizziness) PT Treatment Interventions: DME instruction;Gait training;Functional mobility training;Balance training;Patient/family education (Vestibular rehab)   PT Goals Acute Rehab PT Goals PT Goal Formulation: With patient/family Time For Goal Achievement: 08/18/12 Potential to Achieve Goals: Good Pt will go Supine/Side to Sit: Independently;with HOB 0 degrees PT Goal: Supine/Side to Sit - Progress: Goal set today Pt will go Sit to Stand: with modified independence;with upper extremity assist PT Goal: Sit to Stand - Progress: Goal set today Pt will Ambulate: 51 - 150 feet;with modified independence;with least restrictive assistive device PT Goal: Ambulate - Progress: Goal set today Pt will Go Up / Down Stairs: 6-9 stairs;with supervision;with rail(s);with least restrictive assistive device PT Goal: Up/Down Stairs - Progress: Goal set today Additional Goals Additional Goal #1: Patient will complete all mobility with dizziness </= 2/5. PT Goal: Additional Goal #1 - Progress: Goal set today  Visit Information  Last PT Received On: 08/11/12 Assistance Needed: +1    Subjective Data  Subjective: Daughter present and interpreting for patient. Patient Stated Goal: To go home soon   Prior Functioning  Home Living Lives With: Daughter Available Help at Discharge: Family;Available PRN/intermittently (Daughter works) Type of Home: House Home Access: Stairs to enter Secretary/administrator of Steps: 3 Home Layout: Two level;Bed/bath upstairs Alternate Level Stairs-Number of Steps: 14 Alternate Level Stairs-Rails: Right Home Adaptive Equipment: None Prior Function Level of Independence: Independent;Needs assistance Needs Assistance: Light Housekeeping Light Housekeeping: Maximal Able to Take Stairs?: Yes Driving: No Vocation:  Retired Musician: Prefers language other than English;Interpreter utilized (Daughter interpreted)  Cognition  Cognition Arousal/Alertness: Awake/alert Behavior During Therapy: WFL for tasks assessed/performed Overall Cognitive Status: Within Functional Limits for tasks assessed    Extremity/Trunk Assessment Right Upper Extremity Assessment RUE ROM/Strength/Tone: WFL for tasks assessed Left Upper Extremity Assessment LUE ROM/Strength/Tone: WFL for tasks assessed Right Lower Extremity Assessment RLE ROM/Strength/Tone: WFL for tasks assessed RLE Sensation: WFL - Light Touch Left Lower Extremity Assessment LLE ROM/Strength/Tone: WFL for tasks assessed LLE Sensation: WFL - Light Touch   Balance Balance Balance Assessed: Yes Static Sitting Balance Static Sitting - Balance Support: No upper extremity supported;Feet unsupported Static Sitting - Level of Assistance: 5: Stand by assistance Static Sitting - Comment/# of Minutes: 8  End of Session PT - End of Session Activity Tolerance: Patient limited by fatigue (Limited by dizziness) Patient left: in bed;with call bell/phone within reach;with family/visitor present (With [facility] elevated)  GP     Vena Austria 08/11/2012, 7:01 PM Durenda Hurt. Renaldo Fiddler, [facility] Acute Rehab Services Pager [phone]

## 2012-08-11 NOTE — Progress Notes (Signed)
Subjective: History acquired through the patient and her daughter via a phone [place] interpreter.  Patient reiterates that she has had this dizziness for one week and that it gets worse when she is sitting or standing and moving around. It also worsens with head movement. Patient denies any hearing loss though she does have some ringing more in her right ear. Patient endorses having dizziness previous to this episode a few times a year, although it is never usually this bad.  Regarding her pain medications, patient states that it has been difficult to control her pain. The medicine works the best for her currently is Vicodin and Flexeril. Tylenol doesn't seem to do much.  Patient denies any urinary incontinence, headache.  Review of systems positive for trouble sleeping and decreased appetite.  Patient also asked for refills of her Fosamax and calcitonin.  Objective: Vital signs in last 24 hours: Filed Vitals:   08/10/12 2209 08/11/12 0000 08/11/12 0200 08/11/12 0400  BP: 148/89 131/58 127/53 117/57  Pulse: 91 66 65 70  Temp:  98 F (36.7 C) 98.3 F (36.8 C) 98.7 F (37.1 C)  TempSrc:  Oral Oral Oral  Resp: 20 18 18 20   Height:      Weight:    98 lb 1.6 oz (44.498 kg)  SpO2: 100% 97% 97% 95%   Weight change:  No intake or output data in the 24 hours ending 08/11/12 0707  Physical Exam Blood pressure 117/57, pulse 70, temperature 98.7 F (37.1 C), temperature source Oral, resp. rate 20, height 5' (1.524 m), weight 98 lb 1.6 oz (44.498 kg), SpO2 95.00%. General:  Thin patient lady, no acute distress, alert and oriented x 3 HEENT:  PERRL, EOMI, moist mucous membranes Cardiovascular:  Regular rate and rhythm, no murmurs, rubs or gallops Respiratory:  Clear to auscultation bilaterally, no wheezes, rales, or rhonchi, good air movement Abdomen:  Soft, nondistended, nontender, bowel sounds present Extremities:  Warm and well-perfused, no edema.  Skin: Warm, dry, no rashes Neuro:  Cranial nerves intact, sensation intact throughout, strength symmetric  Lab Results: Basic Metabolic Panel:  Recent Labs Lab 08/10/12 1735 08/11/12 0600  NA 138 141  K 3.6 3.6  CL 100 106  CO2 28 25  GLUCOSE 115* 114*  BUN 17 13  CREATININE 0.72 0.78  CALCIUM 9.4 8.4   Liver Function Tests:  Recent Labs Lab 08/11/12 0600  AST 23  ALT 6  ALKPHOS 51  BILITOT 0.4  PROT 6.1  ALBUMIN 3.3*   CBC:  Recent Labs Lab 08/10/12 1735 08/11/12 0600  WBC 5.0 4.4  NEUTROABS 3.6  --   HGB 11.5* 10.4*  HCT 32.7* 29.4*  MCV 86.5 86.7  PLT 207 196   Cardiac Enzymes:  Recent Labs Lab 08/10/12 1735 08/10/12 2156 08/10/12 2355  CKTOTAL  --  50  --   CKMB  --  2.3  --   TROPONINI <0.30  --  <0.30   Urinalysis:  Recent Labs Lab 08/10/12 2012  COLORURINE YELLOW  LABSPEC 1.008  PHURINE 6.0  GLUCOSEU NEGATIVE  HGBUR MODERATE*  BILIRUBINUR NEGATIVE  KETONESUR 15*  PROTEINUR NEGATIVE  UROBILINOGEN 0.2  NITRITE NEGATIVE  LEUKOCYTESUR NEGATIVE   Studies/Results: Ct Head Wo Contrast  08/10/2012  *RADIOLOGY REPORT*  Clinical Data: Headache and nausea.  Chest pain.  CT HEAD WITHOUT CONTRAST  Technique:  Contiguous axial images were obtained from the base of the skull through the vertex without contrast.  Comparison: No priors.  Findings: Mild cerebral and cerebellar  atrophy.  There are some patchy areas of decreased attenuation throughout the deep and periventricular white matter of the cerebral hemispheres bilaterally, compatible with mild chronic microvascular ischemic disease.  No acute intracranial abnormalities.  Specifically, no evidence of acute intracranial hemorrhage, no definite findings of acute/subacute cerebral ischemia, no mass, mass effect, hydrocephalus or abnormal intra or extra-axial fluid collections. Visualized paranasal sinuses and mastoids are well pneumatized.  No acute displaced skull fractures are identified.  IMPRESSION: 1.  No acute intracranial  abnormalities. 2.  Mild cerebral and cerebellar atrophy with mild chronic microvascular ischemic disease of the white matter.   Original Report Authenticated By: Trudie Reed, M.D.    Dg Chest Port 1 View  08/10/2012  *RADIOLOGY REPORT*  Clinical Data: Chest pain and nausea  PORTABLE CHEST - 1 VIEW  Comparison: 05/21/2012 thoracic spine radiographs  Findings: The cardiomediastinal silhouette is unremarkable. Elevation of the right hemidiaphragm is again noted. There is no evidence of focal airspace disease, pulmonary edema, suspicious pulmonary nodule/mass, pleural effusion, or pneumothorax. No acute bony abnormalities are identified.  IMPRESSION: No evidence of acute cardiopulmonary disease.   Original Report Authenticated By: Harmon Pier, M.D.    Medications:  Medications reviewed  Scheduled Meds: . calcitonin (salmon)  1 spray Alternating Nares Daily  . cholecalciferol  1,000 Units Oral Daily  . fluticasone  2 spray Each Nare Daily  . heparin  5,000 Units Subcutaneous Q8H  . pantoprazole  40 mg Oral Daily  . sodium chloride  3 mL Intravenous Q12H  . sodium chloride  3 mL Intravenous Q12H  . traMADol  50 mg Oral Q8H  . vitamin C  250 mg Oral Daily   Continuous Infusions: . dextrose 5 % and 0.9% NaCl 100 mL/hr at 08/10/12 2218   PRN Meds:.sodium chloride, cyclobenzaprine, HYDROcodone-acetaminophen, meclizine, ondansetron, sodium chloride  Assessment/Plan:  77 y.o woman presents with worsening dizziness and nausea.   Benign paroxysmal positional vertigo with nausea Patient presents with one week of dizziness related to certain positions. DDx includes both central and peripheral vertigo. It seems to be most consistent with benign paroxysmal positional vertigo (patient reports worsening dizziness on position change). Other peripheral vertigo DD includes Mnire's disease (less likely, though she has tinnitus and hearing loss, it is chronic), vestibular neuritis (less likely, patient  denies recent viral illness) and vestibular schwannoma (can't be ruled out completely. She has chronic tinnitus and hearing loss), labyrinthine concussion (can't be ruled out, patient had a bad fall two month go). Less likely central vertigo, though cannot rule out posterior circulation stroke as patient has not had an MRI, though she does not have any other neurologic deficits. Other possibility is drug induced vertigo, such her Flexeril and Norco, Ultram. All these medications can cause dizziness. Orthostatics negative.  -continue meclizine -Zofran as needed for nausea -Vestibular rehabilitation evaluation  Normocytic anemia  -Hbg 11.5 on admission. Appears at baseline -check anemia panel   Osteoporosis and back pain due to T12 compression fracture Hold further Fosamax during hospitalization. Patient states that Ultram is not as helpful as Vicodin. -Continue calcitonin -Continue Vicodin, discontinue tramadol -will also try voltaren gel as needed -patient asks for fosamax/calcitonin refill at discharge  Abnormal urinalysis She does have some urinary frequency. Hgb noted on UA, 3-6 RBCs. No bacteria noted, nitrate negative, no protein. -repeat UA, cont to monitor  Decreased appetite and Insomnia Documented 9 pound weight loss over last year -will try remeron  F/E/N  -DC IVFs -will monitor and replace electrolytes prn  -  regular diet  DVT Px  -Heparin sq   Dispo:  Anticipate discharge after vestibular PT and trial of meclizine The patient does have a current PCP (HO,MICHELE, MD), therefore will be requiring OPC follow-up after discharge.     LOS: 1 day   [place] Ar 08/11/2012, 7:07 AM

## 2012-08-11 NOTE — H&P (Signed)
Internal Medicine Teaching Service Attending Note Date: 08/11/2012  Patient name: Allison Rojas  Medical record number: [id number]  Date of birth: 12-29-1935   I have seen and evaluated Allison Rojas and discussed their care with the Residency Team. Please see Dr Alford Highland H&P for full details. I saw Allison Rojas with Drs Dorise Hiss & Collier Bullock using the telephone interpreter services. Her daughter was present. Allison Rojas has had dizziness for a few yrs that occurred a couple of times per year and lasted 1-2 hours, resolving after she laid down. Now, she has been dizzy for 1 week. It only occurs when she moves her head or stand up. She has no HA, CP, N/V with this. She does endorse chronic engine noise in her R ear along with hearing loss.  She has had decreased appetite and trouble sleeping for quite some time. She was tried on elavil but got twitching of her eyelid and stopped it. She has documented 9 lb weight loss in one yr, max weight loss of 113 to 98 in about 3 yrs.   She also sustained a compression fx of T12 after a fall in Jan 2014. She is taking hydrocodone BID, ultram, and calcitonin for pain relief. The ultram doesn't provide much pain relief. She is also on flexeril for leg spasms.  PMHx, meds, allergies, soc hx, fam hx, and ROS were reviewed.  Physical Exam: Blood pressure 117/57, pulse 70, temperature 98.7 F (37.1 C), temperature source Oral, resp. rate 20, height 5' (1.524 m), weight 98 lb 1.6 oz (44.498 kg), SpO2 95.00%. GEN : nad, lying in bed HEENT no nystagmus, EOMI, sclera clear HRRR  LCTAB ABD + BS, S/NT/ND NEURO : heel to shin intact along with finger to nose. Moving head causes dizziness EXT no edema, +2 DP pulses, ext warm and dry Skin no abnl Nl mood and affect  Labs and imaging were reviewed. HgB 11.5, Cr 0.72, K 3.6, CE neg X 2. EKG : nl sinus, nl axis, no ischemic changes. CT head no acute, mild chronic microvascular changes  Assessment and Plan: I agree with the formulated  Assessment and Plan with the following changes:   1. Dizziness - The length of sxs argues against BPPV but the fact that it only occurs with positional changes and the prior episodes all are in favor of BPPV. She has not RF other than age for brainstem infarct and since her exam is nl, will not precede with MRI brain. Will give trial of antivert and ask vestibualr PT to eval pt.   2. Chronic pain 2/2 compression fx - meds can also cause dizziness. Therefore will try to simply meds. No sig relief from ultram so will D/C and increase hydrocodone that does help pain. Pt not willing to give up flexeril at this time so will leave on. Add NSAID (already on PPI) and voltarin gel. PT/OT consult  3. Weight loss (protein calorie malnutrition) and trouble sleeping - trial of Remeron.  Possible D/C in AM  Burns Spain, MD 4/26/20141:27 PM

## 2012-08-12 DIAGNOSIS — R42 Dizziness and giddiness: Secondary | ICD-10-CM | POA: Diagnosis not present

## 2012-08-12 MED ORDER — DICLOFENAC SODIUM 1 % TD GEL: 4.0000 g | Freq: Four times a day (QID) | TRANSDERMAL | Status: DC

## 2012-08-12 MED ORDER — MIRTAZAPINE 7.5 MG PO TABS: 7.5000 mg | ORAL_TABLET | Freq: Every day | ORAL | Status: DC

## 2012-08-12 MED ORDER — ALENDRONATE SODIUM 35 MG PO TABS: 35.0000 mg | ORAL_TABLET | ORAL | Status: DC

## 2012-08-12 MED ORDER — CALCITONIN (SALMON) 200 UNIT/ACT NA SOLN: 1.0000 | Freq: Every day | NASAL | Status: DC

## 2012-08-12 MED ORDER — ZOSTER VACCINE LIVE 19400 UNT/0.65ML ~~LOC~~ SOLR: 0.6500 mL | Freq: Once | SUBCUTANEOUS | Status: DC

## 2012-08-12 NOTE — Progress Notes (Signed)
Internal Medicine Teaching Service Attending Note Date: 08/12/2012  Patient name: Allison Rojas  Medical record number: [id number]  Date of birth: 31-Jan-1936    This patient has been seen and discussed with the house staff. Please see their note for complete details. I concur with their findings with the following additions/corrections: Allison Rojas was seen with Dr Dorise Hiss using the phone interpreter. She is feeling better although the dizziness is not completely resolved. PT helped quite a bit. She is agreeable with D/C home with home PT, Remeron (slept much better last night), stop tramadol and increase hydrocodone, and a walker.    Allison Rojas 08/12/2012, 12:15 PM

## 2012-08-12 NOTE — Progress Notes (Signed)
Physical Therapy Treatment Patient Details Name: Allison Rojas MRN: [id number] DOB: 08-13-1935 Today's Date: 08/12/2012 Time: 4098-1191 PT Time Calculation (min): 39 min  PT Assessment / Plan / Recommendation Comments on Treatment Session  Patient continued to test positive for Rt. BPPV - performed Epley maneuver.  Also noted slight decrease in balance with high level balance activities.  Recommend HHPT for vestibular and balance therapy. (Daughter requests HHPT after 5:00 so that she can be present - patient does not speak [place].)  Provided daughter with handout on basic information on BPPV.    Follow Up Recommendations  Home health PT;Supervision - Intermittent     Does the patient have the potential to tolerate intense rehabilitation     Barriers to Discharge        Equipment Recommendations  None recommended by PT    Recommendations for Other Services    Frequency Min 4X/week   Plan Discharge plan remains appropriate;Frequency remains appropriate    Precautions / Restrictions Precautions Precautions: Fall Restrictions Weight Bearing Restrictions: No   Pertinent Vitals/Pain     Mobility  Bed Mobility Bed Mobility: Rolling Right;Right Sidelying to Sit;Left Sidelying to Sit;Rolling Left Rolling Right: 7: Independent Rolling Left: 7: Independent Right Sidelying to Sit: 4: Min guard;HOB flat Left Sidelying to Sit: 4: Min guard;HOB flat Details for Bed Mobility Assistance: Verbal cues for mobility.  Performed Modified Hallpike-Dix to right - patient with nystagmus and 4/5 dizziness lasting 15 seconds - positive Rt BPPV.  Performed Epley maneuver. Transfers Transfers: Sit to Stand;Stand to Sit Sit to Stand: 5: Supervision;With upper extremity assist;From bed Stand to Sit: 5: Supervision;With upper extremity assist;To bed Details for Transfer Assistance: Supervision for safety.  No dizziness reported in standing. Ambulation/Gait Ambulation/Gait Assistance: 5:  Supervision Ambulation Distance (Feet): 220 Feet Assistive device: None Ambulation/Gait Assistance Details: Encouraged patient to move slowly for safety.  Patient with kyphosis midthoracic region.  Also noted bil. scapular retraction with cervical extension during gait.  Cues to have patient relax shoulders - unable.  Patient without loss of balance during gait, however did stagger with high level balance activities. Gait Pattern: Step-through pattern;Trunk flexed (Bil scapular retraction) Gait velocity: Slow gait speed      PT Goals Acute Rehab PT Goals PT Goal: Supine/Side to Sit - Progress: Progressing toward goal PT Goal: Sit to Stand - Progress: Progressing toward goal PT Goal: Ambulate - Progress: Progressing toward goal Additional Goals PT Goal: Additional Goal #1 - Progress: Progressing toward goal  Visit Information  Last PT Received On: 08/12/12 Assistance Needed: +1    Subjective Data  Subjective: Daughter present and interpreting for patient.  Daughter raised concerns about patient being alone during day.  Began discussion about aides, however patient does not want aide in the home.  Did agree to HHPT.   Cognition  Cognition Arousal/Alertness: Awake/alert Behavior During Therapy: WFL for tasks assessed/performed Overall Cognitive Status: Within Functional Limits for tasks assessed    Balance  Balance Balance Assessed: Yes High Level Balance High Level Balance Activites: Direction changes;Turns;Sudden stops;Head turns High Level Balance Comments: Decreased balance/staggering with high level balance activities  End of Session PT - End of Session Equipment Utilized During Treatment: Gait belt Activity Tolerance: Patient tolerated treatment well Patient left: in bed;with call bell/phone within reach;with family/visitor present (with HOB elevated) Nurse Communication: Mobility status (Need for HHPT)   GP     Vena Austria 08/12/2012, 10:13 AM Durenda Hurt. Renaldo Fiddler,  [facility] Acute Rehab Services Pager [phone]

## 2012-08-12 NOTE — Progress Notes (Signed)
   CARE MANAGEMENT NOTE 08/12/2012  Patient:  Allison Rojas, Allison Rojas   Account Number:  [id number]  Date Initiated:  08/12/2012  Documentation initiated by:  Magnus Ivan  Subjective/Objective Assessment:   Dizziness     Action/Plan:   Home Health   Anticipated DC Date:  08/12/2012   Anticipated DC Plan:  HOME W HOME HEALTH SERVICES      DC Planning Services  CM consult      [facility] Choice  HOME HEALTH   Choice offered to / List presented to:  C-4 Adult Children   DME arranged  WALKER      DME agency  [facility]     HH arranged  HH-2 PT  HH-6 SOCIAL WORKER      HH agency  [facility]   Status of service:  Completed, signed off Medicare Important Message given?   (If response is "NO", the following Medicare IM given date fields will be blank) Date Medicare IM given:   Date Additional Medicare IM given:    Discharge Disposition:  HOME W HOME HEALTH SERVICES  Per UR Regulation:    If discussed at Long Length of Stay Meetings, dates discussed:    Comments:  08/11/12 11:40am Patient is non [place] speaking. Provided list of options for home health  providers for home health PT. to patients daughter Kerly Rigsbee [phone]) with whom patient lives. Patient daughter chose [facility] from list of agencies. Patients daughter also requested a walker for patient. Walker requested from [facility] and will be brought to patients room by onsite liaison Geramaine. Patients daughter works and is out of the home for 9 hours per day and patient will be home alone. Discussed with daughter about adding a Social work consult to discuss other services available as patient has Art gallery manager. Daughter declined but did agree to PT only. Magnus Ivan, RN, CCM  Case Mgmt [phone]

## 2012-08-12 NOTE — Discharge Summary (Signed)
Internal Medicine Teaching [facility] Discharge Note  Name: Allison Rojas MRN: [id number] DOB: 10/09/1935 77 y.o.  Date of Admission: 08/10/2012  5:26 PM Date of Discharge: 08/12/2012 Attending Physician: Burns Spain, MD  Discharge Diagnosis: Active Problems:   LOW BACK PAIN, CHRONIC   OSTEOPOROSIS   Anemia   Compression fracture of T12 vertebra   Dizzy   Nausea and vomiting   Discharge Medications:   Medication List    STOP taking these medications       traMADol 50 MG tablet  Commonly known as:  ULTRAM      TAKE these medications       alendronate 35 MG tablet  Commonly known as:  FOSAMAX  Take 1 tablet (35 mg total) by mouth every 7 (seven) days. Take with a full glass of water on an empty stomach. Takes on Wed     calcitonin (salmon) 200 UNIT/ACT nasal spray  Commonly known as:  MIACALCIN/FORTICAL  Place 1 spray into the nose daily. Alternate nostril ea day     cyclobenzaprine 10 MG tablet  Commonly known as:  FLEXERIL  Take 10 mg by mouth 3 (three) times daily as needed for muscle spasms. For muscle spasms     diclofenac sodium 1 % Gel  Commonly known as:  VOLTAREN  Apply 4 g topically 4 (four) times daily.     fluticasone 50 MCG/ACT nasal spray  Commonly known as:  FLONASE  Place 2 sprays into the nose daily as needed for rhinitis. For allergies     HYDROcodone-acetaminophen 5-325 MG per tablet  Commonly known as:  NORCO/VICODIN  Take 1 tablet by mouth every 6 (six) hours as needed for pain.     mirtazapine 7.5 MG tablet  Commonly known as:  REMERON  Take 1 tablet (7.5 mg total) by mouth at bedtime.     omeprazole 40 MG capsule  Commonly known as:  PRILOSEC  Take 40 mg by mouth daily.     ranitidine 150 MG tablet  Commonly known as:  ZANTAC  Take 150 mg by mouth 2 (two) times daily as needed. For heartburn     VITAMIN C PO  Take 1 tablet by mouth daily.     VITAMIN D PO  Take 1 tablet by mouth daily.     zoster vaccine live (PF)  19400 UNT/0.65ML injection  Commonly known as:  ZOSTAVAX  Inject 19,400 Units into the skin once.        Disposition and follow-up:   [name] was discharged from [facility] in Stable condition.  At the hospital follow up visit please address dizziness and pain control with new regimen. Also, weight and appetite with starting remeron.  Follow-up Appointments:     Follow-up Information   Follow up with Denton Ar, MD. (May 5 at 8:45 AM)    Contact information:   [address] [place] [address] [phone]      Discharge Orders   Future Appointments Provider Department Dept Phone   08/20/2012 8:45 AM Larey Seat, MD [facility] [phone]   Future Orders Complete By Expires     Call MD for:  persistant dizziness or light-headedness  As directed     Diet - low sodium heart healthy  As directed     Increase activity slowly  As directed        Consultations:  PT/OT/Vestibular rehab  Procedures Performed:  Ct Head Wo Contrast  08/10/2012  *RADIOLOGY REPORT*  Clinical Data: Headache and nausea.  Chest pain.  CT HEAD WITHOUT CONTRAST  Technique:  Contiguous axial images were obtained from the base of the skull through the vertex without contrast.  Comparison: No priors.  Findings: Mild cerebral and cerebellar atrophy.  There are some patchy areas of decreased attenuation throughout the deep and periventricular white matter of the cerebral hemispheres bilaterally, compatible with mild chronic microvascular ischemic disease.  No acute intracranial abnormalities.  Specifically, no evidence of acute intracranial hemorrhage, no definite findings of acute/subacute cerebral ischemia, no mass, mass effect, hydrocephalus or abnormal intra or extra-axial fluid collections. Visualized paranasal sinuses and mastoids are well pneumatized.  No acute displaced skull fractures are identified.  IMPRESSION: 1.  No acute intracranial  abnormalities. 2.  Mild cerebral and cerebellar atrophy with mild chronic microvascular ischemic disease of the white matter.   Original Report Authenticated By: Trudie Reed, M.D.    Dg Chest Port 1 View  08/10/2012  *RADIOLOGY REPORT*  Clinical Data: Chest pain and nausea  PORTABLE CHEST - 1 VIEW  Comparison: 05/21/2012 thoracic spine radiographs  Findings: The cardiomediastinal silhouette is unremarkable. Elevation of the right hemidiaphragm is again noted. There is no evidence of focal airspace disease, pulmonary edema, suspicious pulmonary nodule/mass, pleural effusion, or pneumothorax. No acute bony abnormalities are identified.  IMPRESSION: No evidence of acute cardiopulmonary disease.   Original Report Authenticated By: Harmon Pier, M.D.   Admission HPI:  Patient speaks [place] interpretation used through daughter. 4 y.o [place] woman presents with worsening dizziness and nausea (now improving) on day of admission. She is having positional dizziness, worsening dizziness, which started weeks ago but worsening for the last week and even worse on day of admission. She denies h/a, she did have a fall in January or Feb 2014 leading to a T12 compression fracture (with h/o osteoporosis) but denies recent falls. Daughter reports abnormal gait on day of admission. Other review of systems positive: "engine noise in right ear for years", hearing loss in right ear for years, vomiting x 1 episode of day of admission, decreased oral intake on day of admission, decreased appetite (chronic x 10 years), weight loss (5kg), chronic back pain. Also, she mentions that she woke up at 3 am with right arm pain (now resolved after taking Excedrin and sleeping this morning).   Hospital Course by problem list:  Dizzy - Likely from BPPV. She was trialed on vestibular therapy which helped a lot. She was prescribed meclizine however she did not utilize this in the hospital and she improved without it. Did not prescribe  meclizine on discharge. Concern for multiple sedating medications provoked a medication reconciliation and tramadol was stopped. We continued flexeril and hydrocodone which significantly help her. We did add on voltaren gel which she can use wherever she has pain to give additional pain control from compression fracture. She will continue PT with vestibular therapy on discharge and she was given a walker prior to discharge for extra stability.   OSTEOPOROSIS - She was given new prescriptions for her fosamax and calcitonin nasal at discharge as the patient stated she was out of those medications.     Anemia - Chronic and numbers indicate anemia of chronic disease with no deficiency in folate, B12, iron. Stable during this hospitalization and at her personal baseline.   Compression fracture of T12 vertebra - See above for discussion regarding pain regimen however she was discharged with voltaren gel, flexeril, hydrocodone. Tramadol was stopped. She was  given walker and advised to continue with her osteoporosis treatment.   Nausea and vomiting - 1 episode which was not recurrent during this stay. She was able to tolerate full diet prior to discharge.   Sleep disturbance and poor appetite - Remeron was started for sleep and appetite given low weights and she did have improvement in her sleep. Will need long term follow up for appetite and weights.   Discharge Vitals:  BP 110/64  Pulse 56  Temp(Src) 97.8 F (36.6 C) (Oral)  Resp 16  Ht 5' (1.524 m)  Wt 99 lb 6.4 oz (45.088 kg)  BMI 19.41 kg/m2  SpO2 95%  Discharge Labs: No results found for this or any previous visit (from the past 24 hour(s)).  SignedGenella Mech 08/12/2012, 12:10 PM   Time Spent on Discharge: 28 minutes Services Ordered on Discharge: PT for vestibular rehab Equipment Ordered on Discharge: walker

## 2012-08-20 ENCOUNTER — Ambulatory Visit (INDEPENDENT_AMBULATORY_CARE_PROVIDER_SITE_OTHER): Payer: Medicare Other | Admitting: Internal Medicine

## 2012-08-20 ENCOUNTER — Encounter: Payer: Self-pay | Admitting: Internal Medicine

## 2012-08-20 VITALS — BP 143/70 | HR 83 | Temp 97.3°F | Ht 60.0 in | Wt 100.4 lb

## 2012-08-20 DIAGNOSIS — H8110 Benign paroxysmal vertigo, unspecified ear: Secondary | ICD-10-CM

## 2012-08-20 DIAGNOSIS — Z0000 Encounter for general adult medical examination without abnormal findings: Secondary | ICD-10-CM | POA: Diagnosis not present

## 2012-08-20 DIAGNOSIS — R630 Anorexia: Secondary | ICD-10-CM

## 2012-08-20 DIAGNOSIS — S22080D Wedge compression fracture of T11-T12 vertebra, subsequent encounter for fracture with routine healing: Secondary | ICD-10-CM

## 2012-08-20 DIAGNOSIS — G4700 Insomnia, unspecified: Secondary | ICD-10-CM

## 2012-08-20 DIAGNOSIS — K2970 Gastritis, unspecified, without bleeding: Secondary | ICD-10-CM | POA: Diagnosis not present

## 2012-08-20 DIAGNOSIS — M8448XD Pathological fracture, other site, subsequent encounter for fracture with routine healing: Secondary | ICD-10-CM | POA: Diagnosis not present

## 2012-08-20 DIAGNOSIS — K2990 Gastroduodenitis, unspecified, without bleeding: Secondary | ICD-10-CM

## 2012-08-20 MED ORDER — CYCLOBENZAPRINE HCL 10 MG PO TABS: 10.0000 mg | ORAL_TABLET | Freq: Three times a day (TID) | ORAL | Status: DC | PRN

## 2012-08-20 MED ORDER — MECLIZINE HCL 12.5 MG PO TABS: 12.5000 mg | ORAL_TABLET | Freq: Three times a day (TID) | ORAL | Status: DC | PRN

## 2012-08-20 MED ORDER — TRAMADOL HCL 50 MG PO TABS: 50.0000 mg | ORAL_TABLET | Freq: Three times a day (TID) | ORAL | Status: DC | PRN

## 2012-08-20 MED ORDER — OMEPRAZOLE 40 MG PO CPDR: 40.0000 mg | DELAYED_RELEASE_CAPSULE | Freq: Two times a day (BID) | ORAL | Status: DC

## 2012-08-20 NOTE — Assessment & Plan Note (Signed)
Patient has had a poor appetite for several years. It seems to be quite troublesome to the patient and her daughter and they both request medications to help. She was started on Remeron during her recent hospitalization for dizziness and the patient states that this helps with her sleep but does not help with appetite. She has only been on this medication less than 2 weeks, therefore, we will continue to try this and reevaluate at the next visit.  -Continue Remeron at night -Followup in one month

## 2012-08-20 NOTE — Assessment & Plan Note (Addendum)
Patient continues to have some residual nausea and dizziness, worse when she turns her head to the right side. She was unable to continue vestibular rehabilitation at home as she does not have a Nurse, learning disability and is unable to communicate with the therapists. Patient does state that her dizziness has improved since her hospitalization.  -Patient given prescription for meclizine to take as needed for dizziness -Discussed potential side effects of this medication and instructed patient and her family to use caution

## 2012-08-20 NOTE — Progress Notes (Signed)
Internal Medicine Clinic Visit    HPI:  Allison Rojas is a 77 y.o. year old female with a history of chronic low back pain, compression fractures, osteoporosis, insomnia, chronic decreased appetite. She was recently admitted with a chief complaint of dizziness and diagnosed with BPPV and she presents to clinic today for followup.  Patient states that she has not had any dizziness "attacks," but she still has some mild dizziness and associated nausea which is worse when she moves her head to the right side. Patient and her daughter are requesting a medicine to treat the dizziness. Patient was set up with home health vestibular physical therapy, however, she has not been able to do this as she is not able to communicate with the therapists and her daughter is working during the day.  Patient states that she continues to have decreased appetite and also request a medication to treat this. She states that her mother had stomach problems but she is unsure of the diagnosis. She has been taking Remeron since her recent admission but she states this has not been helping at. She about 2 meals per day but never feels like eating. Denies any melena, hematochezia, vomiting, laxative use.   Family history: mother had stomach problems but no cancer. Father : no BP, DM, no major problems, no cancer.    Past Medical History  Diagnosis Date  . Gastritis   . Rib pain   . Osteoporosis   . Insomnia   . Peptic ulcer disease   . Chronic low back pain   . Irregular heart rate   . Compression fracture     T9 and T12 noted 05/2012 imaging. Followed by pain clinic   . Fall     Jan or Feb 2014     No past surgical history on file.   ROS:  A complete review of systems was otherwise negative, except as noted in the HPI.  Allergies: Amitriptyline  Medications: Current Outpatient Prescriptions  Medication Sig Dispense Refill  . alendronate (FOSAMAX) 35 MG tablet Take 1 tablet (35 mg total) by mouth every 7  (seven) days. Take with a full glass of water on an empty stomach. Takes on Wed  4 tablet  0  . Ascorbic Acid (VITAMIN C PO) Take 1 tablet by mouth daily.      . calcitonin, salmon, (MIACALCIN/FORTICAL) 200 UNIT/ACT nasal spray Place 1 spray into the nose daily. Alternate nostril ea day  3.7 mL  0  . Cholecalciferol (VITAMIN D PO) Take 1 tablet by mouth daily.      . cyclobenzaprine (FLEXERIL) 10 MG tablet Take 10 mg by mouth 3 (three) times daily as needed for muscle spasms. For muscle spasms      . diclofenac sodium (VOLTAREN) 1 % GEL Apply 4 g topically 4 (four) times daily.  100 g  0  . fluticasone (FLONASE) 50 MCG/ACT nasal spray Place 2 sprays into the nose daily as needed for rhinitis. For allergies      . HYDROcodone-acetaminophen (NORCO/VICODIN) 5-325 MG per tablet Take 1 tablet by mouth every 6 (six) hours as needed for pain.  120 tablet  3  . mirtazapine (REMERON) 7.5 MG tablet Take 1 tablet (7.5 mg total) by mouth at bedtime.  30 tablet  1  . omeprazole (PRILOSEC) 40 MG capsule Take 40 mg by mouth daily.      . ranitidine (ZANTAC) 150 MG tablet Take 150 mg by mouth 2 (two) times daily as needed. For heartburn      .  zoster vaccine live, PF, (ZOSTAVAX) 16109 UNT/0.65ML injection Inject 19,400 Units into the skin once.  1 each  0   No current facility-administered medications for this visit.    History   Social History  . Marital Status: Widowed    Spouse Name: N/A    Number of Children: N/A  . Years of Education: N/A   Occupational History  . Not on file.   Social History Main Topics  . Smoking status: Never Smoker   . Smokeless tobacco: Not on file  . Alcohol Use: No  . Drug Use: No  . Sexually Active: Not on file   Other Topics Concern  . Not on file   Social History Narrative   Financial assistance approved for 100% discount at [facility] and has [facility] card per [name]   11/30/2009   She is widowed, she has 2 children (only admits to 1 daughter now 07/2012), she does  not smoke cigarettes or drink alcohol   Lives with daughter Tennyson Kallen [phone]                family history is not on file.  Physical Exam Blood pressure 143/70, pulse 83, temperature 97.3 F (36.3 C), temperature source Oral, height 5' (1.524 m), weight 100 lb 6.4 oz (45.541 kg), SpO2 95.00%. General:  No acute distress, alert and oriented x 3, petite Asian lady HEENT:  PERRL, EOMI, no lymphadenopathy, moist mucous membranes Cardiovascular:  Regular rate and rhythm, no murmurs, rubs or gallops Respiratory:  Clear to auscultation bilaterally, no wheezes, rales, or rhonchi Abdomen:  Soft, nondistended, nontender to palpation, positive bowel sounds Extremities:  Warm and well-perfused, no clubbing, cyanosis, or edema.  Skin: Warm, dry, no rashes Neuro: Cranial nerves intact, patient reported feeling dizzy when looking to the right side  Labs: Lab Results  Component Value Date   CREATININE 0.78 08/11/2012   BUN 13 08/11/2012   NA 141 08/11/2012   K 3.6 08/11/2012   CL 106 08/11/2012   CO2 25 08/11/2012   Lab Results  Component Value Date   WBC 4.4 08/11/2012   HGB 10.4* 08/11/2012   HCT 29.4* 08/11/2012   MCV 86.7 08/11/2012   PLT 196 08/11/2012      Assessment and Plan:    FOLLOWUP: Allison Rojas will follow back up in our clinic in approximately one month. Allison Rojas knows to call our clinic in the meantime with any questions or new issues.

## 2012-08-20 NOTE — Patient Instructions (Addendum)
Stop taking Zantac, increased omeprazole to TWICE DAILY.  Continue Remeron for appetite and sleep.  Take meclizine as needed for dizziness.   Return to clinic in one month for PCP appointment and follow up.

## 2012-08-20 NOTE — Progress Notes (Signed)
I have discussed this case with Dr. Kesty, read the documentation and I agree with the plan of care. Please see the resident note for details of management.  

## 2012-08-20 NOTE — Assessment & Plan Note (Addendum)
Patient continues to have back pain and now states that tramadol works better for her than hydrocodone. Tramadol was discontinued during her recent hospitalization for dizziness to decrease the amount of medicines she was on.  -Discontinue hydrocodone, start tramadol -Appreciate sports medicine input into the care of this patient, agree with physical therapy if patient is willing to go

## 2012-08-20 NOTE — Assessment & Plan Note (Signed)
Patient previously on amitriptyline, however this was discontinued during her hospitalization and Remeron was started.  Patient states that Remeron helps with her sleep, we will continue this for now.

## 2012-08-20 NOTE — Assessment & Plan Note (Signed)
Patient is complaining of continued reflux symptoms as well as occasional Tylenol pain. She has a history of GERD with an EGD showing moderate atrophic gastritis in 2012. Patient states that she takes omeprazole once daily and then ranitidine for breakthrough symptoms.   -Discontinue Zantac -Increase omeprazole to twice a day dosing -Return to clinic in one month for followup and reevaluation

## 2012-08-20 NOTE — Assessment & Plan Note (Signed)
It is unclear If patient has had a colonoscopy before, I do not see one in the system. There is a note from 2 years ago states that she was referred. -Please address colonoscopy at the next visit

## 2012-09-12 ENCOUNTER — Telehealth: Payer: Self-pay | Admitting: *Deleted

## 2012-09-12 NOTE — Telephone Encounter (Signed)
Received faxed PA request for pt's cyclobenzaprine 10 mg tabs take one-half tab by mouth three times daily #45.  Per pt's insurance/pharmacy high-risk medication in the elderly 65+.  Faxed form also stated drug to drug interaction with moderate clinical significance between the tramadol and cyclobenzaprine.  I can obtain PA approval for the cyclobenzaprine, all insurance required is that MD has acknowledged the risk of this medication in pt's 65 and over.  Will forward info to pt's pcp for review.Kingsley Spittle Cassady5/28/201410:55 AM

## 2012-09-13 NOTE — Telephone Encounter (Signed)
She can as long as she does not get drowsy from those two medications: tramadol and Flexeril.  I actually did not prescribe Flexeril to patient and Dr. Collier Bullock put her on Flexeril as needed for muscle spasm.

## 2012-09-19 NOTE — Telephone Encounter (Signed)
Contacted pt's insurance at [phone], results pending "clinical review".  Per [employer], iIf request is denied alt meds include baclofen and tizanidine. May take up to 3 days for a decision.Kingsley Spittle Cassady6/4/20149:44 AM

## 2012-11-21 ENCOUNTER — Other Ambulatory Visit: Payer: Self-pay | Admitting: Internal Medicine

## 2012-11-21 ENCOUNTER — Encounter: Payer: Self-pay | Admitting: Internal Medicine

## 2012-11-21 ENCOUNTER — Ambulatory Visit (INDEPENDENT_AMBULATORY_CARE_PROVIDER_SITE_OTHER): Payer: Medicare Other | Admitting: Internal Medicine

## 2012-11-21 VITALS — BP 128/73 | HR 78 | Temp 97.6°F | Resp 20 | Ht 59.5 in | Wt 94.1 lb

## 2012-11-21 DIAGNOSIS — R630 Anorexia: Secondary | ICD-10-CM

## 2012-11-21 DIAGNOSIS — K2960 Other gastritis without bleeding: Secondary | ICD-10-CM

## 2012-11-21 DIAGNOSIS — K2970 Gastritis, unspecified, without bleeding: Secondary | ICD-10-CM

## 2012-11-21 DIAGNOSIS — R1013 Epigastric pain: Secondary | ICD-10-CM

## 2012-11-21 DIAGNOSIS — R634 Abnormal weight loss: Secondary | ICD-10-CM

## 2012-11-21 DIAGNOSIS — S22080S Wedge compression fracture of T11-T12 vertebra, sequela: Secondary | ICD-10-CM

## 2012-11-21 DIAGNOSIS — G4700 Insomnia, unspecified: Secondary | ICD-10-CM

## 2012-11-21 DIAGNOSIS — IMO0002 Reserved for concepts with insufficient information to code with codable children: Secondary | ICD-10-CM

## 2012-11-21 DIAGNOSIS — K2990 Gastroduodenitis, unspecified, without bleeding: Secondary | ICD-10-CM

## 2012-11-21 LAB — COMPREHENSIVE METABOLIC PANEL
ALT: 12 U/L (ref 0–35)
BUN: 28 mg/dL — ABNORMAL HIGH (ref 6–23)
CO2: 29 mEq/L (ref 19–32)
Calcium: 9.1 mg/dL (ref 8.4–10.5)
Chloride: 104 mEq/L (ref 96–112)
Creat: 0.88 mg/dL (ref 0.50–1.10)
Glucose, Bld: 89 mg/dL (ref 70–99)

## 2012-11-21 MED ORDER — ZOLPIDEM TARTRATE ER 6.25 MG PO TBCR: 6.2500 mg | EXTENDED_RELEASE_TABLET | Freq: Every evening | ORAL | Status: DC | PRN

## 2012-11-21 NOTE — Progress Notes (Signed)
I saw and evaluated the patient.  I personally confirmed the key portions of the history and exam documented by Dr. Gill and I reviewed pertinent patient test results.  The assessment, diagnosis, and plan were formulated together and I agree with the documentation in the resident's note. 

## 2012-11-21 NOTE — Assessment & Plan Note (Signed)
Pt states that she has difficulty falling asleep and wakes up after about 4-5 hours.  She typically goes to bed around 11pm and then wakes up after 4-5 hours. She is then not able to go back to sleep.  She denies taking naps during the day.  Sleep disturbance is common in the elderly population.  I will prescribe ambien to see if this may help with sleep.  Another option on next visit may be to try melatonin as well.

## 2012-11-21 NOTE — Patient Instructions (Addendum)
Please return to the clinic in 1-2 months  1. We will refer you to the Gastroenterologist for evaluation of your abdominal pain 2. We will prescribe Ambien for sleep 3. We will get a lipase lab today and abdominal ultrasound to evaluate your abdominal pain

## 2012-11-21 NOTE — Assessment & Plan Note (Signed)
Pt has pain in the epigastric region that has been going on for approximately 1 month.  She also had associated weight loss, insomnia, and decreased appetite.  FOBT was negative today.  She denies any fever, N/V/D, but reports frequent constipation.  She has a h/o moderate gastritis with negative biopsy in 2012 from [facility].  I would like to r/o malignancy given her symptoms and ethnicity and will send her to [facility] for f/u EGD.  I will also obtain lipase and abdominal US to r/o any additional causes of abdominal pain.  Additionally she has been on fosamax which gastritis is a known side effect.  Upon questioning, she states she sits up at least 30 minutes after taking her fosamax.  She denies any use of NSAIDS.

## 2012-11-21 NOTE — Assessment & Plan Note (Signed)
Pt has been losing weight for some time now.  I sending her to [facility] for an EGD to r/o any GI malignancy given her h/o gastritis, weight loss, and abdominal pain.  She was prescribed Remeron in the past but states that has not helped her weight.  Additionally, she is alone all day and doesn't eat much.  However, she eats dinner with her daughter when she comes home and so I wonder if there is not some component of loneliness/depression as well.  We will closely monitor her weight loss and will ask her to keep a food diary of what she eats.

## 2012-11-21 NOTE — Progress Notes (Signed)
Patient ID: Allison Rojas, female   DOB: [birth year], 77 y.o.   MRN: [id number]          HPI: [name] is a 77 y.o. [place] female who has a PMH of osteoporosis with multiple compression fractures, gastritis, weight loss, and BPPV.  She presents to the clinic with her daughter and an interpretor.  She has been experiencing epigastric pain for approximately 1 month.  She also had associated weight loss, insomnia, and decreased appetite.  FOBT was negative today.  She denies any fever, N/V/D, but reports frequent constipation.  She has a h/o moderate gastritis with negative biopsy in 2012 from [facility].  Additionally she has been on fosamax which gastritis is a known side effect.  Upon questioning, she states she sits up at least 30 minutes after taking her fosamax.  She denies any use of NSAIDS.    She lives with her daughter and is at home alone all day by herself.  She reports having a better appetite when her daughter arrives, but does not seem to eat much during the day.    She also reports significant insomnia.  She states she would just feel better if she would be able to get some sleep. She reports going to bed around 11pm and waking up 3-4 hours later.  She has difficulty falling asleep and staying asleep.  She denies taking any naps during the day.     Past Medical History  Diagnosis Date  . Gastritis   . Rib pain   . Osteoporosis   . Insomnia   . Peptic ulcer disease   . Chronic low back pain   . Irregular heart rate   . Compression fracture     T9 and T12 noted 05/2012 imaging. Followed by pain clinic   . Fall     Jan or Feb 2014    Current Outpatient Prescriptions  Medication Sig Dispense Refill  . alendronate (FOSAMAX) 35 MG tablet Take 1 tablet (35 mg total) by mouth every 7 (seven) days. Take with a full glass of water on an empty stomach. Takes on Wed  4 tablet  0  . Ascorbic Acid (VITAMIN C PO) Take 1 tablet by mouth daily.      . calcitonin, salmon,  (MIACALCIN/FORTICAL) 200 UNIT/ACT nasal spray Place 1 spray into the nose daily. Alternate nostril ea day  3.7 mL  0  . Cholecalciferol (VITAMIN D PO) Take 1 tablet by mouth daily.      . cyclobenzaprine (FLEXERIL) 10 MG tablet Take 1 tablet (10 mg total) by mouth 3 (three) times daily as needed for muscle spasms.  90 tablet  0  . diclofenac sodium (VOLTAREN) 1 % GEL Apply 4 g topically 4 (four) times daily.  100 g  0  . fluticasone (FLONASE) 50 MCG/ACT nasal spray Place 2 sprays into the nose daily as needed for rhinitis. For allergies      . meclizine (ANTIVERT) 12.5 MG tablet Take 1 tablet (12.5 mg total) by mouth 3 (three) times daily as needed for dizziness or nausea.  90 tablet  0  . mirtazapine (REMERON) 7.5 MG tablet Take 1 tablet (7.5 mg total) by mouth at bedtime.  30 tablet  1  . omeprazole (PRILOSEC) 40 MG capsule Take 1 capsule (40 mg total) by mouth 2 (two) times daily. 30 minutes before breakfast, 30 minutes before dinner  60 capsule  6  . traMADol (ULTRAM) 50 MG tablet Take 1 tablet (50 mg total) by  mouth every 8 (eight) hours as needed for pain.  90 tablet  0  . zolpidem (AMBIEN CR) 6.25 MG CR tablet Take 1 tablet (6.25 mg total) by mouth at bedtime as needed for sleep.  30 tablet  3  . zoster vaccine live, PF, (ZOSTAVAX) 16109 UNT/0.65ML injection Inject 19,400 Units into the skin once.  1 each  0   No current facility-administered medications for this visit.   No family history on file. History   Social History  . Marital Status: Widowed    Spouse Name: N/A    Number of Children: N/A  . Years of Education: N/A   Social History Main Topics  . Smoking status: Never Smoker   . Smokeless tobacco: None  . Alcohol Use: No  . Drug Use: No  . Sexually Active: None   Other Topics Concern  . None   Social History Narrative   Financial assistance approved for 100% discount at [facility] and has [facility] card per [name]   11/30/2009   She is widowed, she has 2 children (only  admits to 1 daughter now 07/2012), she does not smoke cigarettes or drink alcohol   Lives with daughter Allison Rojas [phone]                Review of Systems: Constitutional: Denies fever, chills, diaphoresis, appetite change and fatigue.  Respiratory: Denies SOB, DOE, cough, chest tightness, and wheezing.  Cardiovascular: No chest pain, palpitations and leg swelling.  Gastrointestinal: No abdominal pain, nausea, vomiting, bloody stools Genitourinary: No dysuria, frequency, hematuria, or flank pain.  Musculoskeletal: No myalgias, back pain, joint swelling, arthralgias    Objective:  Physical Exam: Filed Vitals:   11/21/12 0842  BP: 128/73  Pulse: 78  Temp: 97.6 F (36.4 C)  TempSrc: Oral  Resp: 20  Height: 4' 11.5" (1.511 m)  Weight: 94 lb 1.6 oz (42.683 kg)  SpO2: 97%   General:  Thin, cachetic, malnourished elderly female.  No acute distress.  Lungs: CTA bilaterally. Heart: RRR; no extra sounds or murmurs  Abdomen: Thin. Non-distended, normal BS, soft, nontender; no hepatosplenomegaly  Extremities: No pedal edema. No joint swelling or tenderness. Neurologic: Alert and oriented x3. No obvious neurologic deficits.  Assessment & Plan:  I have discussed my assessment and plan  with Dr. Criselda Peaches  as detailed under problem based charting.

## 2012-11-21 NOTE — Assessment & Plan Note (Signed)
Pt seems to be doing well with pain control today.  She denies any current symptoms of back pain.  Continue current regimen of tramadol.

## 2012-11-21 NOTE — Assessment & Plan Note (Signed)
Pt has been experiencing decreased appetite.  I sending her to [facility] for an EGD to r/o any GI malignancy given her h/o gastritis, weight loss, and abdominal pain.  She was prescribed Remeron in the past but states that has not helped with appetite.  Additionally, she is alone all day and doesn't eat much.  However, she eats dinner with her daughter when she comes home and so I wonder if there is not some component of loneliness/depression as well. We will closely monitor her weight loss and will ask her to keep a food diary of what she eats.

## 2012-11-22 ENCOUNTER — Ambulatory Visit (HOSPITAL_COMMUNITY)
Admission: RE | Admit: 2012-11-22 | Discharge: 2012-11-22 | Disposition: A | Discharge status: Home / Self Care | Payer: Medicare Other | Source: Ambulatory Visit | Attending: Internal Medicine | Admitting: Internal Medicine

## 2012-11-22 ENCOUNTER — Ambulatory Visit (HOSPITAL_COMMUNITY): Payer: Medicare Other

## 2012-11-22 DIAGNOSIS — I700 Atherosclerosis of aorta: Secondary | ICD-10-CM | POA: Diagnosis not present

## 2012-11-22 DIAGNOSIS — R1013 Epigastric pain: Secondary | ICD-10-CM | POA: Diagnosis not present

## 2012-11-22 DIAGNOSIS — R634 Abnormal weight loss: Secondary | ICD-10-CM | POA: Insufficient documentation

## 2012-11-25 ENCOUNTER — Encounter: Payer: Self-pay | Admitting: Internal Medicine

## 2012-11-27 ENCOUNTER — Other Ambulatory Visit: Payer: Self-pay | Admitting: Internal Medicine

## 2012-11-27 ENCOUNTER — Telehealth: Payer: Self-pay | Admitting: *Deleted

## 2012-11-27 DIAGNOSIS — G4700 Insomnia, unspecified: Secondary | ICD-10-CM

## 2012-11-27 NOTE — Telephone Encounter (Signed)
Allison Rojas / [facility] is requesting refill on Ranitidine 150mg  #60 - take one tablet by mouth twice daily. [name] RN 11/27/12 12N

## 2012-11-28 ENCOUNTER — Other Ambulatory Visit: Payer: Self-pay | Admitting: Internal Medicine

## 2012-11-28 MED ORDER — ZOLPIDEM TARTRATE 5 MG PO TABS: 5.0000 mg | ORAL_TABLET | Freq: Every evening | ORAL | Status: DC | PRN

## 2012-11-29 ENCOUNTER — Other Ambulatory Visit: Payer: Self-pay | Admitting: *Deleted

## 2012-11-30 ENCOUNTER — Other Ambulatory Visit: Payer: Self-pay | Admitting: *Deleted

## 2012-11-30 NOTE — Telephone Encounter (Signed)
Lanora Manis This came to my desk  As a refill request but no refill is indicated???? Denny Levy

## 2012-12-03 MED ORDER — RANITIDINE HCL 150 MG PO TABS: 150.0000 mg | ORAL_TABLET | Freq: Two times a day (BID) | ORAL | Status: DC

## 2012-12-03 NOTE — Telephone Encounter (Signed)
Sorry please ignore - routed to wrong person - thanks! Wyatt Haste, RN-BSN

## 2012-12-21 ENCOUNTER — Ambulatory Visit (INDEPENDENT_AMBULATORY_CARE_PROVIDER_SITE_OTHER): Payer: Medicare Other | Admitting: Gastroenterology

## 2012-12-21 ENCOUNTER — Encounter: Payer: Self-pay | Admitting: Gastroenterology

## 2012-12-21 VITALS — BP 110/62 | HR 60 | Ht 59.5 in | Wt 93.0 lb

## 2012-12-21 DIAGNOSIS — K5900 Constipation, unspecified: Secondary | ICD-10-CM | POA: Diagnosis not present

## 2012-12-21 DIAGNOSIS — R6881 Early satiety: Secondary | ICD-10-CM | POA: Diagnosis not present

## 2012-12-21 DIAGNOSIS — R634 Abnormal weight loss: Secondary | ICD-10-CM | POA: Diagnosis not present

## 2012-12-21 MED ORDER — MOVIPREP 100 G PO SOLR: 1.0000 | Freq: Once | ORAL | Status: DC

## 2012-12-21 NOTE — Patient Instructions (Addendum)
You will be set up for an upper endoscopy for weight loss, early satiety (moderate sedation, LEC). You will be set up for a colonoscopy for constipation, weight loss. If this is negative then would proceed with CT scan abd/pelvis.. Please start miralax powder (one dose, once daily) for your constipation.                                               We are excited to introduce MyChart, a new best-in-class service that provides you online access to important information in your electronic medical record. We want to make it easier for you to view your health information - all in one secure location - when and where you need it. We expect MyChart will enhance the quality of care and service we provide.  When you register for MyChart, you can:    View your test results.    Request appointments and receive appointment reminders via email.    Request medication renewals.    View your medical history, allergies, medications and immunizations.    Communicate with your physician's office through a password-protected site.    Conveniently print information such as your medication lists.  To find out if MyChart is right for you, please talk to a member of our clinical staff today. We will gladly answer your questions about this free health and wellness tool.  If you are age 75 or older and want a member of your family to have access to your record, you must provide written consent by completing a proxy form available at our office. Please speak to our clinical staff about guidelines regarding accounts for patients younger than age 75.  As you activate your MyChart account and need any technical assistance, please call the MyChart technical support line at [phone]) or email your question to [email or web address]. If you email your question(s), please include your name, a return phone number and the best time to reach you.  If you have non-urgent health-related questions, you  can send a message to our office through MyChart at [email or web address]. If you have a medical emergency, call 911.  Thank you for using MyChart as your new health and wellness resource!   MyChart licensed from [employer],  [id number]. Patents Pending.

## 2012-12-21 NOTE — Progress Notes (Signed)
Review of pertinent gastrointestinal problems: 1. Moderate gastritis (EGD , [name] 05/2010), biopsies showed no h. Pylori.  Was taking ASA, NSAID.  Previous ulcer noted in [place] per family.  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  Down about 20 pounds in past 2 1/2 years on our scale here in GI.  [place] last month was normal.  cmet last month was normal  Has poor appetite.  Has nausea at times.  No vomiting.  Eating soft foods, small amounts.  If she eats more food then she has early satiety.  Nausea.  This has been going on for   Daughter here.  She hurt her back last February and has been on twice daily narcotic pain meds. Her appetite issues started around that same time.  She is on chronic pain meds.  HAs BM every three to four days.  Review of systems: Pertinent positive and negative review of systems were noted in the above HPI section. Complete review of systems was performed and was otherwise normal.    Past Medical History  Diagnosis Date  . Gastritis   . Rib pain   . Osteoporosis   . Insomnia   . Peptic ulcer disease   . Chronic low back pain   . Irregular heart rate   . Compression fracture     T9 and T12 noted 05/2012 imaging. Followed by pain clinic   . Fall     Jan or Feb 2014     History reviewed. No pertinent past surgical history.  Current Outpatient Prescriptions  Medication Sig Dispense Refill  . acetaminophen-codeine (TYLENOL #3) 300-30 MG per tablet       . alendronate (FOSAMAX) 35 MG tablet Take 1 tablet (35 mg total) by mouth every 7 (seven) days. Take with a full glass of water on an empty stomach. Takes on Wed  4 tablet  0  . Ascorbic Acid (VITAMIN C PO) Take 1 tablet by mouth daily.      . calcitonin, salmon, (MIACALCIN/FORTICAL) 200 UNIT/ACT nasal spray Place 1 spray into the nose daily. Alternate nostril ea day  3.7 mL  0  . chlorhexidine (PERIDEX) 0.12 %  solution       . Cholecalciferol (VITAMIN D PO) Take 1 tablet by mouth daily.      . cyclobenzaprine (FLEXERIL) 10 MG tablet Take 1 tablet (10 mg total) by mouth 3 (three) times daily as needed for muscle spasms.  90 tablet  0  . diclofenac sodium (VOLTAREN) 1 % GEL Apply 4 g topically 4 (four) times daily.  100 g  0  . fluticasone (FLONASE) 50 MCG/ACT nasal spray Place 2 sprays into the nose daily as needed for rhinitis. For allergies      . HYDROcodone-acetaminophen (NORCO/VICODIN) 5-325 MG per tablet       . mirtazapine (REMERON) 7.5 MG tablet Take 1 tablet (7.5 mg total) by mouth at bedtime.  30 tablet  1  . omeprazole (PRILOSEC) 40 MG capsule Take 1 capsule (40 mg total) by mouth 2 (two) times daily. 30 minutes before breakfast, 30 minutes before dinner  60 capsule  6  . ranitidine (ZANTAC) 150 MG tablet Take 1 tablet (150 mg total) by mouth 2 (two) times daily.  60 tablet  3  . zolpidem (AMBIEN) 5 MG tablet Take 1 tablet (5 mg total) by mouth at bedtime as needed for sleep.  30 tablet  0  . zoster vaccine live, PF, (ZOSTAVAX) 16109 UNT/0.65ML injection Inject 19,400 Units into the skin once.  1 each  0  . meclizine (ANTIVERT) 12.5 MG tablet Take 1 tablet (12.5 mg total) by mouth 3 (three) times daily as needed for dizziness or nausea.  90 tablet  0   No current facility-administered medications for this visit.    Allergies as of 12/21/2012 - Review Complete 12/21/2012  Allergen Reaction Noted  . Amitriptyline  08/10/2012    History reviewed. No pertinent family history.  History   Social History  . Marital Status: Widowed    Spouse Name: N/A    Number of Children: N/A  . Years of Education: N/A   Occupational History  . Not on file.   Social History Main Topics  . Smoking status: Never Smoker   . Smokeless tobacco: Never Used  . Alcohol Use: No  . Drug Use: No  . Sexual Activity: Not on file   Other Topics Concern  . Not on file   Social History Narrative    Financial assistance approved for 100% discount at [facility] and has [facility] card per [name]   11/30/2009   She is widowed, she has 2 children (only admits to 1 daughter now 07/2012), she does not smoke cigarettes or drink alcohol   Lives with daughter Naje Rice [phone]                   Physical Exam: BP 110/62  Pulse 60  Ht 4' 11.5" (1.511 m)  Wt 93 lb (42.185 kg)  BMI 18.48 kg/m2 Constitutional: Frail, thin-appearing Psychiatric: alert and oriented x3 Eyes: extraocular movements intact Mouth: oral pharynx moist, no lesions Neck: supple no lymphadenopathy Cardiovascular: heart regular rate and rhythm Lungs: clear to auscultation bilaterally Abdomen: soft, nontender, nondistended, no obvious ascites, no peritoneal signs, normal bowel sounds Extremities: no lower extremity edema bilaterally Skin: no lesions on visible extremities    Assessment and plan: 77 y.o. female with  weight loss, early satiety, chronic constipation  She has lost 20 pounds in 2-1/2 years. She appears somewhat cachectic today. There is a language barrier here year with a translator present I have trouble getting her exact history however. It does seem like she has anorexia, early satiety. This all probably start her on a time of a back injury for which she has required daily narcotic pain medicines since then. I would like to rule out neoplastic causes and proceed with upper endoscopy as well as colonoscopy at her soonest convenience. She has never had colonoscopy. She has chronic constipation, likely contributed to by her narcotics. I am recommending MiraLax daily for that as well.

## 2012-12-25 ENCOUNTER — Other Ambulatory Visit (HOSPITAL_COMMUNITY): Payer: Self-pay | Admitting: Internal Medicine

## 2013-01-08 ENCOUNTER — Encounter (HOSPITAL_COMMUNITY): Payer: Self-pay

## 2013-01-08 ENCOUNTER — Emergency Department (HOSPITAL_COMMUNITY): Payer: Medicare Other

## 2013-01-08 ENCOUNTER — Emergency Department (HOSPITAL_COMMUNITY)
Admission: EM | Admit: 2013-01-08 | Discharge: 2013-01-08 | Disposition: A | Discharge status: Home / Self Care | Payer: Medicare Other | Attending: Emergency Medicine | Admitting: Emergency Medicine

## 2013-01-08 DIAGNOSIS — S59909A Unspecified injury of unspecified elbow, initial encounter: Secondary | ICD-10-CM | POA: Diagnosis not present

## 2013-01-08 DIAGNOSIS — M810 Age-related osteoporosis without current pathological fracture: Secondary | ICD-10-CM | POA: Diagnosis not present

## 2013-01-08 DIAGNOSIS — Z8719 Personal history of other diseases of the digestive system: Secondary | ICD-10-CM | POA: Diagnosis not present

## 2013-01-08 DIAGNOSIS — R64 Cachexia: Secondary | ICD-10-CM | POA: Insufficient documentation

## 2013-01-08 DIAGNOSIS — Z8679 Personal history of other diseases of the circulatory system: Secondary | ICD-10-CM | POA: Diagnosis not present

## 2013-01-08 DIAGNOSIS — R079 Chest pain, unspecified: Secondary | ICD-10-CM | POA: Diagnosis not present

## 2013-01-08 DIAGNOSIS — S52599A Other fractures of lower end of unspecified radius, initial encounter for closed fracture: Secondary | ICD-10-CM | POA: Insufficient documentation

## 2013-01-08 DIAGNOSIS — M545 Low back pain, unspecified: Secondary | ICD-10-CM | POA: Diagnosis not present

## 2013-01-08 DIAGNOSIS — G4700 Insomnia, unspecified: Secondary | ICD-10-CM | POA: Diagnosis not present

## 2013-01-08 DIAGNOSIS — S2239XA Fracture of one rib, unspecified side, initial encounter for closed fracture: Secondary | ICD-10-CM | POA: Diagnosis not present

## 2013-01-08 DIAGNOSIS — Z888 Allergy status to other drugs, medicaments and biological substances status: Secondary | ICD-10-CM | POA: Diagnosis not present

## 2013-01-08 DIAGNOSIS — IMO0002 Reserved for concepts with insufficient information to code with codable children: Secondary | ICD-10-CM | POA: Diagnosis not present

## 2013-01-08 DIAGNOSIS — G8929 Other chronic pain: Secondary | ICD-10-CM | POA: Insufficient documentation

## 2013-01-08 DIAGNOSIS — S2231XA Fracture of one rib, right side, initial encounter for closed fracture: Secondary | ICD-10-CM

## 2013-01-08 DIAGNOSIS — Z79899 Other long term (current) drug therapy: Secondary | ICD-10-CM | POA: Insufficient documentation

## 2013-01-08 DIAGNOSIS — M25529 Pain in unspecified elbow: Secondary | ICD-10-CM | POA: Diagnosis not present

## 2013-01-08 DIAGNOSIS — M25539 Pain in unspecified wrist: Secondary | ICD-10-CM | POA: Diagnosis not present

## 2013-01-08 DIAGNOSIS — W19XXXA Unspecified fall, initial encounter: Secondary | ICD-10-CM | POA: Insufficient documentation

## 2013-01-08 DIAGNOSIS — Y9301 Activity, walking, marching and hiking: Secondary | ICD-10-CM | POA: Insufficient documentation

## 2013-01-08 DIAGNOSIS — S5291XA Unspecified fracture of right forearm, initial encounter for closed fracture: Secondary | ICD-10-CM

## 2013-01-08 DIAGNOSIS — T1490XA Injury, unspecified, initial encounter: Secondary | ICD-10-CM | POA: Diagnosis not present

## 2013-01-08 DIAGNOSIS — M79609 Pain in unspecified limb: Secondary | ICD-10-CM | POA: Diagnosis not present

## 2013-01-08 DIAGNOSIS — Y92009 Unspecified place in unspecified non-institutional (private) residence as the place of occurrence of the external cause: Secondary | ICD-10-CM | POA: Insufficient documentation

## 2013-01-08 MED ORDER — HYDROCODONE-ACETAMINOPHEN 5-325 MG PO TABS: 1.0000 | ORAL_TABLET | Freq: Three times a day (TID) | ORAL | Status: DC | PRN

## 2013-01-08 NOTE — ED Notes (Signed)
Patient from home, lives with daughter via [facility] with c/o FALL that occurred last night around 1930-2000. Patient usually ambulates with walker. Tripped on threshold while ambulating into the house. Fell onto her right side. No LOC. Denies hitting head. No obvious injuries or deformities. Patient c/o extreme pain to right side, especially when taking deep breaths. Bruising noted to RUE. Patient has hx back fx and chronic pain. Patient speaks limited [place]. Daughter at bedside and able to translate. Received Fentanyl 100 mcg and Zofran 4 mg. Pain currently 0.

## 2013-01-08 NOTE — ED Notes (Signed)
Patient transported to X-ray 

## 2013-01-08 NOTE — ED Provider Notes (Signed)
CSN: [id number]     Arrival date & time 01/08/13  1553 History   First MD Initiated Contact with Patient 01/08/13 1554     Chief Complaint  Patient presents with  . Fall   (Consider location/radiation/quality/duration/timing/severity/associated sxs/prior Treatment) HPI Patient presents home after a fall that occurred last night, approximately 18 hours ago.  Patient is here with her daughter who assists with translation.  Patient was in her usual state of health until the fall.  Sounds as though the patient's walker fell, and the patient fell with it.  She fell onto her right side.  No loss of consciousness, no subsequent loss of consciousness, no confusion, no disorientation, no weakness anywhere. Since the fall the patient has had pain in her right elbow, right wrist, right ribs and.  Pain is worse with deep inspiration and motion of the right upper extremity. No incontinence, vomiting, diarrhea. Patient has chronic pain, previously diagnosed compression fractures in T9, T12.  Past Medical History  Diagnosis Date  . Gastritis   . Rib pain   . Osteoporosis   . Insomnia   . Peptic ulcer disease   . Chronic low back pain   . Irregular heart rate   . Compression fracture     T9 and T12 noted 05/2012 imaging. Followed by pain clinic   . Fall     Jan or Feb 2014    History reviewed. No pertinent past surgical history. No family history on file. History  Substance Use Topics  . Smoking status: Never Smoker   . Smokeless tobacco: Never Used  . Alcohol Use: No   OB History   Grav Para Term Preterm Abortions TAB SAB Ect Mult Living                 Review of Systems  All other systems reviewed and are negative.    Allergies  Amitriptyline  Home Medications   Current Outpatient Rx  Name  Route  Sig  Dispense  Refill  . acetaminophen-codeine (TYLENOL #3) 300-30 MG per tablet               . alendronate (FOSAMAX) 35 MG tablet   Oral   Take 1 tablet (35 mg total) by  mouth every 7 (seven) days. Take with a full glass of water on an empty stomach. Takes on Wed   4 tablet   0   . Ascorbic Acid (VITAMIN C PO)   Oral   Take 1 tablet by mouth daily.         . calcitonin, salmon, (MIACALCIN/FORTICAL) 200 UNIT/ACT nasal spray   Nasal   Place 1 spray into the nose daily. Alternate nostril ea day   3.7 mL   0   . Cholecalciferol (VITAMIN D PO)   Oral   Take 1 tablet by mouth daily.         . cyclobenzaprine (FLEXERIL) 10 MG tablet   Oral   Take 10 mg by mouth 3 (three) times daily as needed for muscle spasms (leg pain).         Marland Kitchen diclofenac sodium (VOLTAREN) 1 % GEL   Topical   Apply 4 g topically 4 (four) times daily.   100 g   0   . fluticasone (FLONASE) 50 MCG/ACT nasal spray   Nasal   Place 2 sprays into the nose daily as needed for rhinitis. For allergies         . HYDROcodone-acetaminophen (NORCO/VICODIN) 5-325 MG per tablet  Oral   Take 1 tablet by mouth 3 (three) times daily as needed.          . mirtazapine (REMERON) 7.5 MG tablet   Oral   Take 7.5 mg by mouth at bedtime.         Marland Kitchen omeprazole (PRILOSEC) 40 MG capsule   Oral   Take 1 capsule (40 mg total) by mouth 2 (two) times daily. 30 minutes before breakfast, 30 minutes before dinner   60 capsule   6   . ranitidine (ZANTAC) 150 MG tablet   Oral   Take 150 mg by mouth 2 (two) times daily as needed for heartburn.          BP 122/58  Temp(Src) 98.8 F (37.1 C) (Oral)  Resp 20  Wt 93 lb 4 oz (42.298 kg)  BMI 18.53 kg/m2  SpO2 97% Physical Exam  Nursing note and vitals reviewed. Constitutional: She is oriented to person, place, and time. She appears cachectic.  HENT:  Head: Normocephalic and atraumatic.  Eyes: Conjunctivae and EOM are normal.  Cardiovascular: Normal rate and regular rhythm.   Pulmonary/Chest: Effort normal and breath sounds normal. No stridor. No respiratory distress.  Abdominal: She exhibits no distension.  Musculoskeletal: She  exhibits no edema.       Right shoulder: Normal.       Left shoulder: Normal.       Right elbow: She exhibits normal range of motion, no swelling, no effusion, no deformity and no laceration. Tenderness found. Radial head tenderness noted.       Left elbow: Normal.       Right wrist: She exhibits decreased range of motion, tenderness and bony tenderness. She exhibits no deformity and no laceration.       Left wrist: Normal.  About both the elbow and the wrist on the right side there is mild tenderness to palpation.  Patient can flex and extend both of these joints appropriate, and distal pulses are appropriate. There is ecchymosis about the palmar surface of the wrist. No gross deformities of either joint.   There is tenderness to palpation about the right anterior chest wall, without appreciable deformity or crepitus. Patient flexes and extends both hips symmetrically, with appropriate strength.  Neurological: She is alert and oriented to person, place, and time. No cranial nerve deficit.  Skin: Skin is warm and dry.  Psychiatric: She has a normal mood and affect.    ED Course  ORTHOPEDIC INJURY TREATMENT Date/Time: 01/08/2013 7:00 PM Performed by: Gerhard Munch Authorized by: Gerhard Munch Consent: Verbal consent obtained. The procedure was performed in an emergent situation. Risks and benefits: risks, benefits and alternatives were discussed Consent given by: patient Patient understanding: patient states understanding of the procedure being performed Patient consent: the patient's understanding of the procedure matches consent given Procedure consent: procedure consent matches procedure scheduled Relevant documents: relevant documents present and verified Test results: test results available and properly labeled Site marked: the operative site was marked Imaging studies: imaging studies available Required items: required blood products, implants, devices, and special  equipment available Patient identity confirmed: verbally with patient Injury location: wrist Location details: right wrist Injury type: fracture Fracture type: distal radius Pre-procedure neurovascular assessment: neurovascularly intact Pre-procedure distal perfusion: normal Pre-procedure neurological function: normal Pre-procedure range of motion: reduced Local anesthesia used: no Patient sedated: no Manipulation performed: no Immobilization: splint Splint type: long arm Supplies used: Ortho-Glass Post-procedure neurovascular assessment: post-procedure neurovascularly intact Post-procedure distal perfusion: normal Post-procedure neurological function:  normal Post-procedure range of motion: unchanged Patient tolerance: Patient tolerated the procedure well with no immediate complications.   (including critical care time) Labs Review Labs Reviewed - No data to display Imaging Review No results found. I reviewed the x-rays with the family, demonstrated the images to the patient and her daughter.  We had a lengthy discussion about the findings.  MDM  No diagnosis found. This elderly female with osteoporosis presents after a fall that occurred yesterday.  Notably, there was no head trauma, nor any loss of consciousness or any neurologic deficits following a fall.  However she has had pain in the rib cage, right wrist since the event.  X-rays demonstrate fractures in the ribs and right wrist.  She was neurovascularly intact, but required splinting of the wrist and discharged with analgesia to follow up with orthopedics and her primary care physician.   Gerhard Munch, MD 01/08/13 2030

## 2013-01-08 NOTE — Progress Notes (Signed)
Orthopedic Tech Progress Note Patient Details:  Allison Rojas 10/15/1935 [id number]  Ortho Devices Type of Ortho Device: Ace wrap;Sugartong splint;Arm sling Ortho Device/Splint Location: rue Ortho Device/Splint Interventions: Application   Nikki Dom 01/08/2013, 7:22 PM

## 2013-01-09 ENCOUNTER — Telehealth (HOSPITAL_COMMUNITY): Payer: Self-pay | Admitting: Emergency Medicine

## 2013-01-09 ENCOUNTER — Other Ambulatory Visit: Payer: Self-pay | Admitting: *Deleted

## 2013-01-09 NOTE — ED Notes (Addendum)
Call from pts daughter. She tried to call number for referral Dr Izora Ribas and is wrong number.  Daughter provided office # for Dr Izora Ribas [phone].  Thank You

## 2013-01-09 NOTE — Telephone Encounter (Signed)
Pt seen in ED yesterday and given # 20 vicodin after fall

## 2013-01-14 ENCOUNTER — Other Ambulatory Visit: Payer: Self-pay | Admitting: *Deleted

## 2013-01-14 NOTE — Telephone Encounter (Signed)
Call from pt's daughter - states pt has back and wrist fxs and is unable to move; cannot schedule an appt at this time. Wants to know if u can refill rxs?  thanks

## 2013-01-15 ENCOUNTER — Encounter: Payer: Medicare Other | Admitting: Internal Medicine

## 2013-01-15 ENCOUNTER — Other Ambulatory Visit: Payer: Self-pay | Admitting: *Deleted

## 2013-01-15 NOTE — Telephone Encounter (Signed)
Talked to pt's daughter about refill refusal and need to schedule an appt per Dr Glendell Docker . She decided to schedule an appt on Oct 10 per her request. Also inform her if pt is having a lot of pain/decreased movement , she needs to go to the ED per Dr Glendell Docker.

## 2013-01-16 DIAGNOSIS — S52539A Colles' fracture of unspecified radius, initial encounter for closed fracture: Secondary | ICD-10-CM | POA: Diagnosis not present

## 2013-01-16 MED ORDER — ALENDRONATE SODIUM 35 MG PO TABS: 35.0000 mg | ORAL_TABLET | ORAL | Status: DC

## 2013-01-18 ENCOUNTER — Encounter: Payer: Medicare Other | Admitting: Gastroenterology

## 2013-01-25 ENCOUNTER — Ambulatory Visit: Payer: Medicare Other | Admitting: Internal Medicine

## 2013-02-04 IMAGING — CR DG LUMBAR SPINE COMPLETE 4+V
5 series · 5 of 5 positions shown · non-contrast
Comparison: Rightward scoliosis in the mid lumbar spine.

CLINICAL DATA: Low back pain.

LUMBAR SPINE - COMPLETE 4+ VIEW

[t l-spine a.p.]
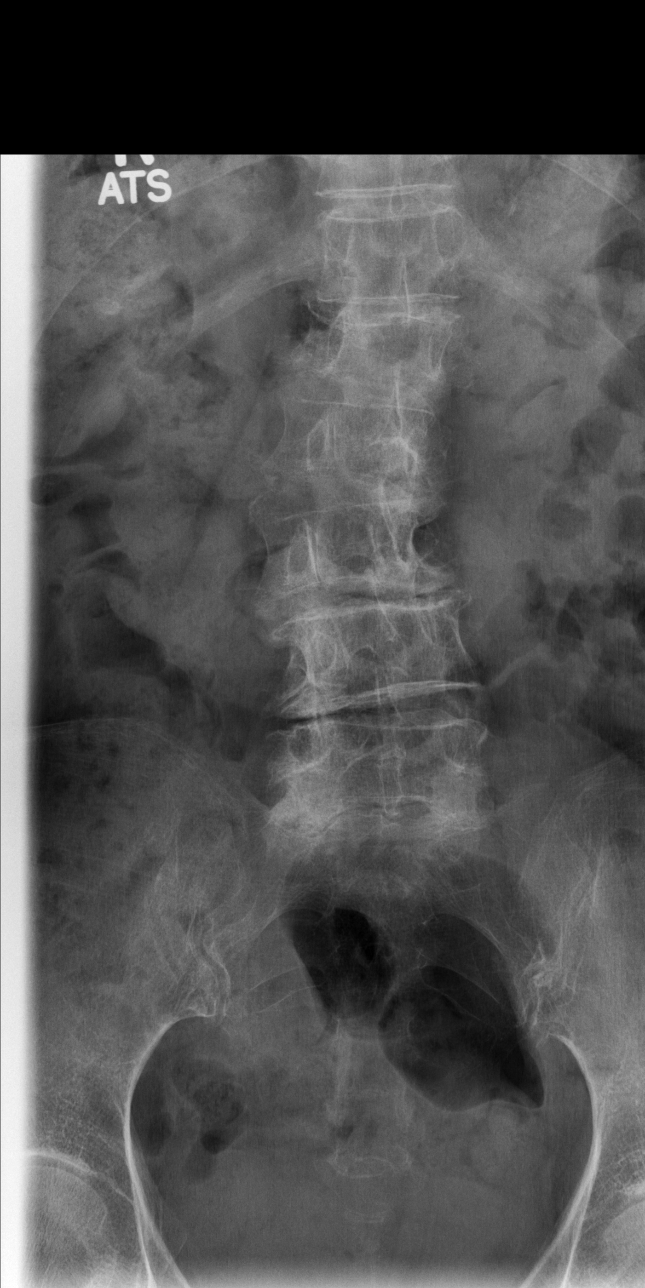

[t l-spine oblique exposure (1 of 2)]
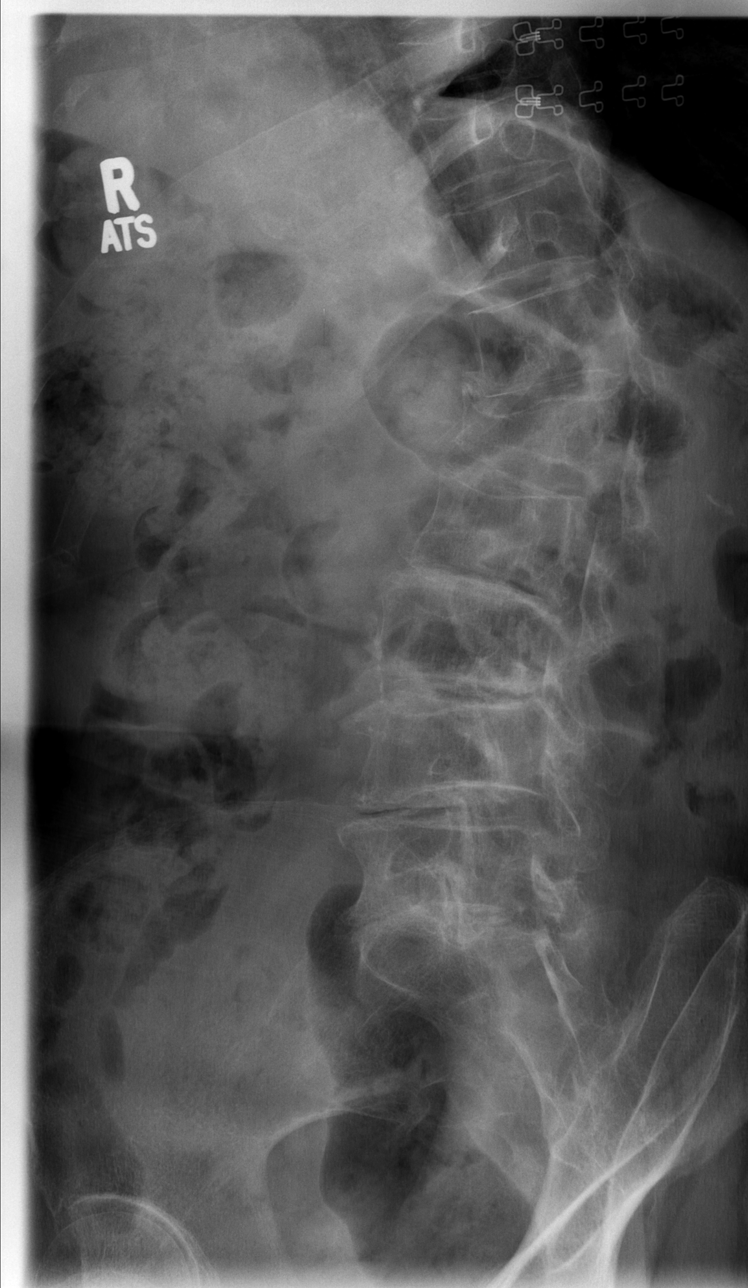

[t l-spine oblique exposure (2 of 2)]
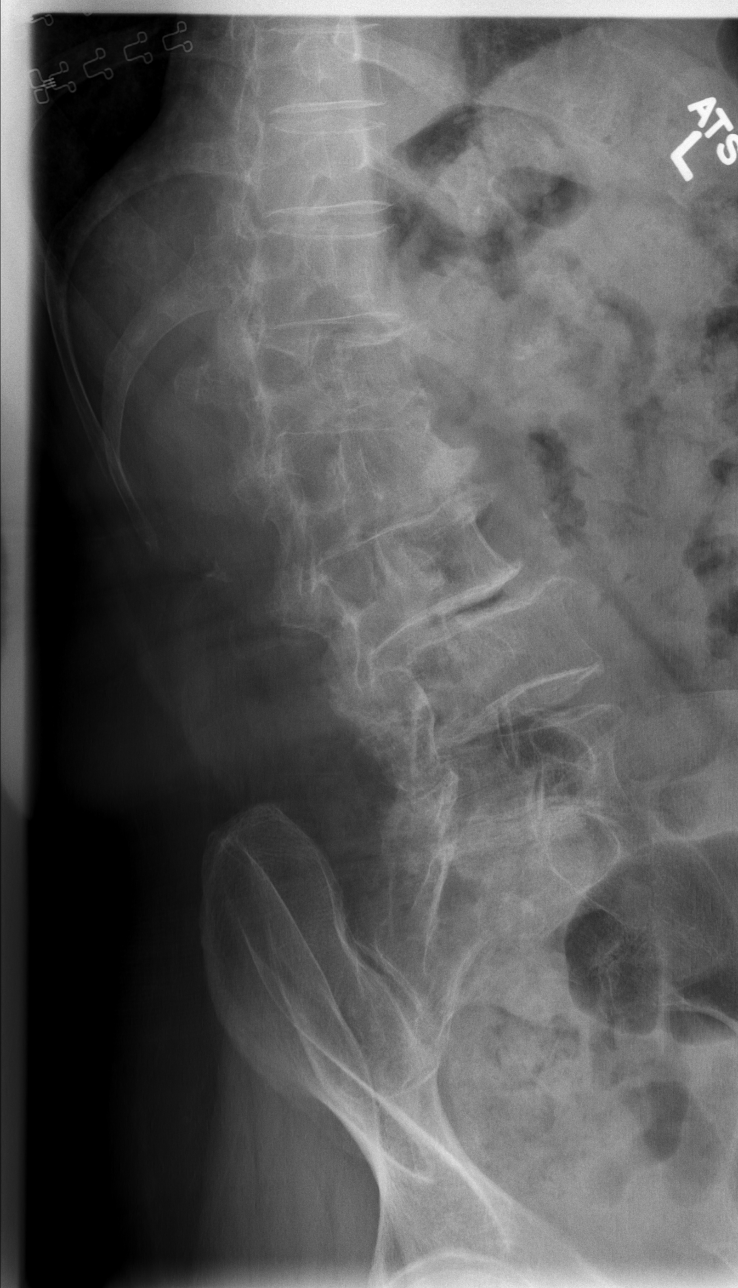

[t l-spine lat]
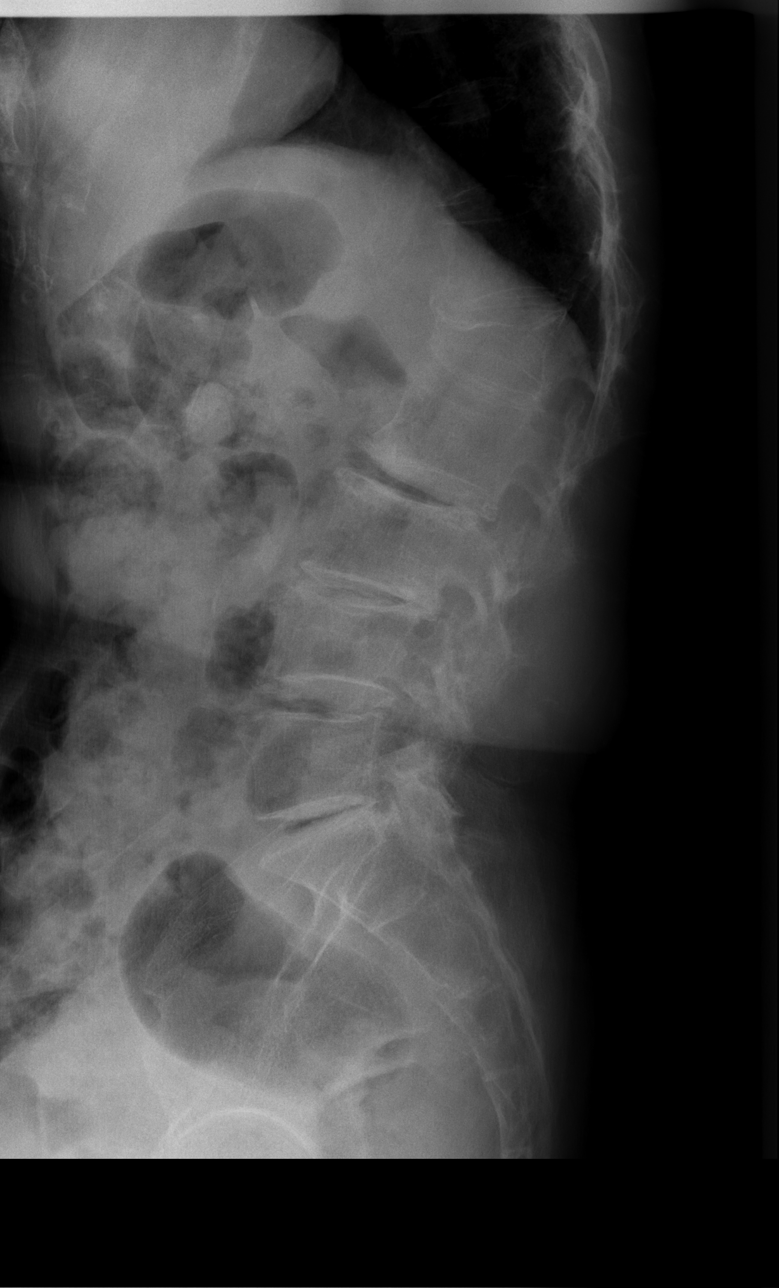

[t l-spine l5-s1 spot]
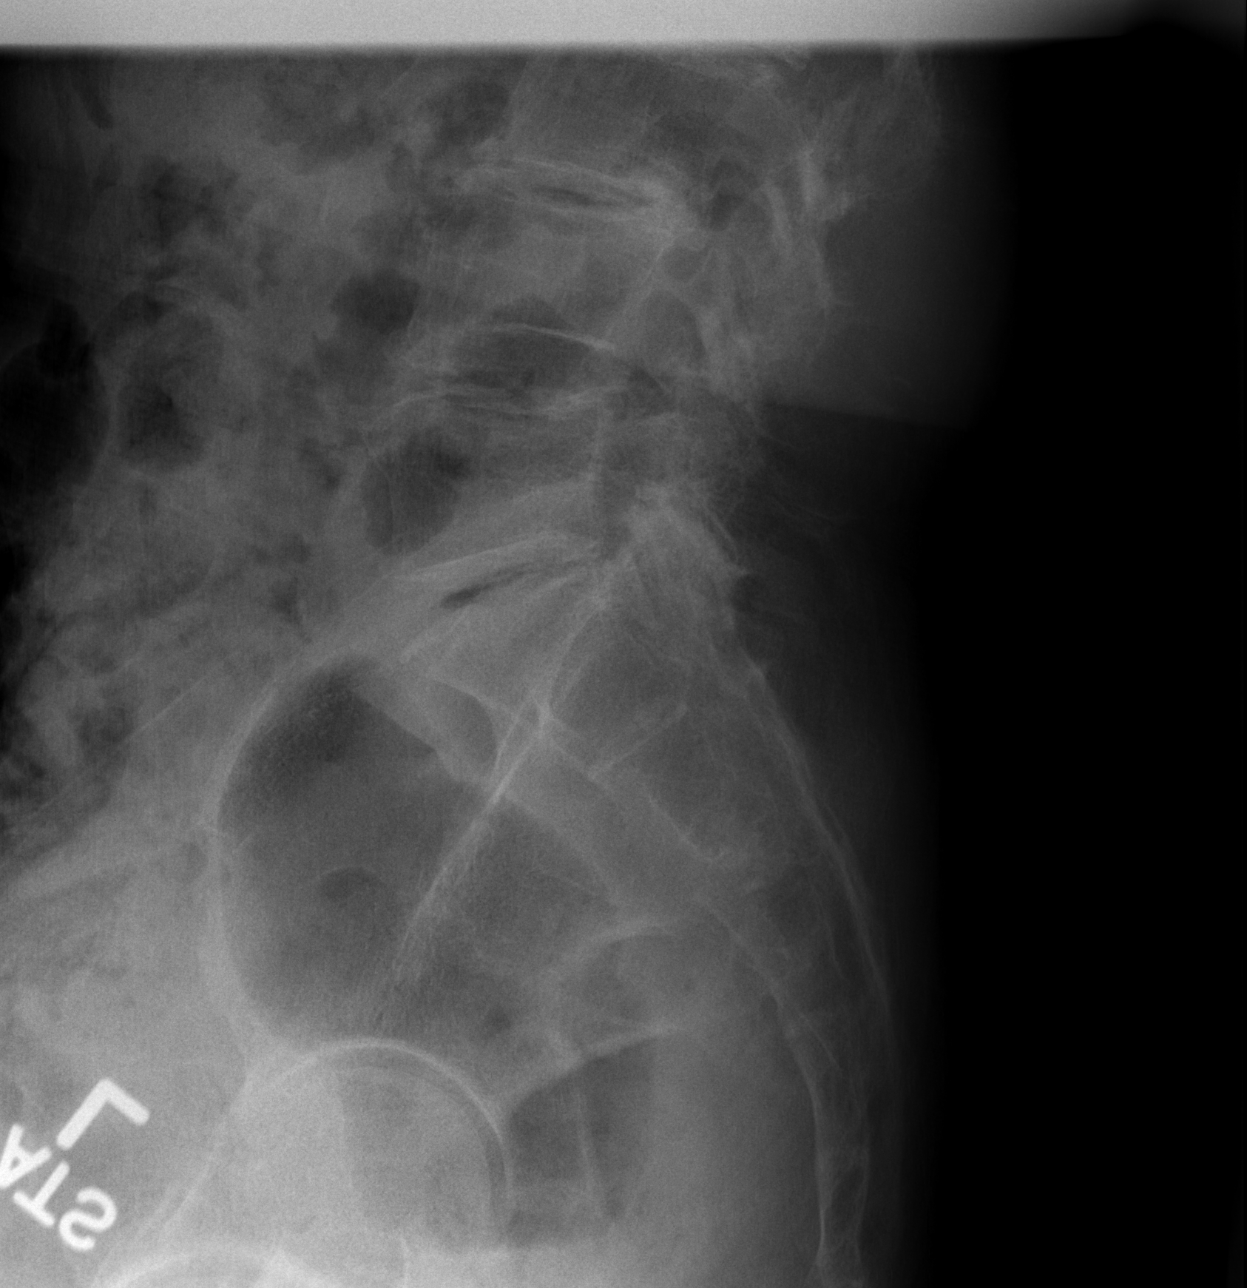

[5 of 5 positions shown; findings below may reference images not displayed]

Diffuse
severe osteopenia.  Severe compression fracture at L1, age
indeterminate.  SI joints are symmetric and unremarkable.
FINDINGS: none
IMPRESSION: Severe L1 compression fracture, age indeterminate.

Moderate rightward scoliosis.  Severe spondylosis.

Severe osteoporosis.

## 2013-02-05 DIAGNOSIS — Z23 Encounter for immunization: Secondary | ICD-10-CM | POA: Diagnosis not present

## 2013-03-13 ENCOUNTER — Telehealth: Payer: Self-pay | Admitting: Gastroenterology

## 2013-03-13 NOTE — Telephone Encounter (Signed)
FYI:  Pt daughter wanted to reschedule procedures because the pt has had a recent fracture of the ribs and is very weak.  She has been rescheduled to 05/01/13.

## 2013-03-13 NOTE — Telephone Encounter (Signed)
i agree, thanks 

## 2013-03-19 ENCOUNTER — Encounter: Payer: Medicare Other | Admitting: Gastroenterology

## 2013-04-24 ENCOUNTER — Telehealth: Payer: Self-pay | Admitting: Gastroenterology

## 2013-04-24 DIAGNOSIS — R6881 Early satiety: Secondary | ICD-10-CM

## 2013-04-24 NOTE — Telephone Encounter (Signed)
That is ok.  This should be with MAC sedation.

## 2013-04-24 NOTE — Telephone Encounter (Signed)
Pt's daughter has requested only an ENDO is this ok?

## 2013-04-25 NOTE — Telephone Encounter (Signed)
Left message on machine to call back  

## 2013-04-26 NOTE — Telephone Encounter (Signed)
Pt has been rescheduled and new instructions reviewed and mailed to the home

## 2013-04-26 NOTE — Telephone Encounter (Signed)
Left message on machine to call back  

## 2013-05-01 ENCOUNTER — Encounter: Payer: Medicare Other | Admitting: Gastroenterology

## 2013-05-07 ENCOUNTER — Other Ambulatory Visit: Payer: Self-pay | Admitting: Internal Medicine

## 2013-05-13 ENCOUNTER — Other Ambulatory Visit: Payer: Self-pay | Admitting: *Deleted

## 2013-05-14 MED ORDER — ALENDRONATE SODIUM 35 MG PO TABS: 35.0000 mg | ORAL_TABLET | ORAL | Status: DC

## 2013-05-31 ENCOUNTER — Encounter: Payer: Self-pay | Admitting: Gastroenterology

## 2013-05-31 ENCOUNTER — Ambulatory Visit (AMBULATORY_SURGERY_CENTER): Payer: Medicare Other | Admitting: Gastroenterology

## 2013-05-31 VITALS — BP 128/71 | HR 51 | Temp 97.3°F | Resp 18 | Ht 59.5 in | Wt 93.0 lb

## 2013-05-31 DIAGNOSIS — R634 Abnormal weight loss: Secondary | ICD-10-CM | POA: Diagnosis not present

## 2013-05-31 DIAGNOSIS — R6881 Early satiety: Secondary | ICD-10-CM | POA: Diagnosis not present

## 2013-05-31 DIAGNOSIS — K2970 Gastritis, unspecified, without bleeding: Secondary | ICD-10-CM | POA: Diagnosis not present

## 2013-05-31 DIAGNOSIS — K319 Disease of stomach and duodenum, unspecified: Secondary | ICD-10-CM | POA: Diagnosis not present

## 2013-05-31 DIAGNOSIS — K2990 Gastroduodenitis, unspecified, without bleeding: Secondary | ICD-10-CM

## 2013-05-31 DIAGNOSIS — K449 Diaphragmatic hernia without obstruction or gangrene: Secondary | ICD-10-CM

## 2013-05-31 MED ORDER — SODIUM CHLORIDE 0.9 % IV SOLN: 500.0000 mL | INTRAVENOUS | Status: DC

## 2013-05-31 NOTE — Op Note (Signed)
[facility] [address] [place], [id number]   ENDOSCOPY PROCEDURE REPORT  PATIENT: Allison Rojas, Allison  MR#: [id number] BIRTHDATE: 1936/01/21 , 77  yrs. old GENDER: Female ENDOSCOPIST: Rachael Feeaniel P Jacobs, MD PROCEDURE DATE:  05/31/2013 PROCEDURE:  EGD w/ biopsy ASA CLASS:     Class III INDICATIONS:  early satiety, weight loss; recommended EGD 5 months ago in office (as well as colonoscopy for weight loss, never CRC screening before); she has multiple bone fractures after fall shortly after that visit, decided to NOT have colonoscopy and only EGD. MEDICATIONS: MAC sedation, administered by CRNA and propofol (Diprivan) 100mg  IV TOPICAL ANESTHETIC: none  DESCRIPTION OF PROCEDURE: After the risks benefits and alternatives of the procedure were thoroughly explained, informed consent was obtained.  The LB [id number] endoscope was introduced through the mouth and advanced to the second portion of the duodenum. Without limitations.  The instrument was slowly withdrawn as the mucosa was fully examined.   There was mild, non-specific pan gastritis.  Biopsies were taken and sent to pathology.  There was a 3cm hiatal hernia without Cameron's erosions.  The distal esophagus was tortuous but there were no focal strictures.  The examination was otherwise normal. Retroflexed views revealed no abnormalities.     The scope was then withdrawn from the patient and the procedure completed. COMPLICATIONS: There were no complications.  ENDOSCOPIC IMPRESSION: There was mild, non-specific pan gastritis.  Biopsies were taken and sent to pathology.  There was a 3cm hiatal hernia without Cameron's erosions.  The distal esophagus was tortuous but there were no focal strictures.  The examination was otherwise normal.  RECOMMENDATIONS: Await final pathology.  If H.  pylori is noted, will start appropriate antibiotics.  You should reconsider the colonoscopy that was recommended several  months ago (for unexplained weight loss, screening).   eSigned:  Rachael Feeaniel P Jacobs, MD 05/31/2013 11:09 AM

## 2013-05-31 NOTE — Progress Notes (Signed)
A/ox3 pleased with MAC, report to Karol RN 

## 2013-05-31 NOTE — Progress Notes (Signed)
Called to room to assist during endoscopic procedure.  Patient ID and intended procedure confirmed with present staff. Received instructions for my participation in the procedure from the performing physician.  

## 2013-05-31 NOTE — Patient Instructions (Signed)
Used Interpreter - Ms. Un Park in Recovery for translation.  Information sheet given on Gastritis.  YOU HAD AN ENDOSCOPIC PROCEDURE TODAY AT THE [facility]: Refer to the procedure report that was given to you for any specific questions about what was found during the examination.  If the procedure report does not answer your questions, please call your gastroenterologist to clarify.  If you requested that your care partner not be given the details of your procedure findings, then the procedure report has been included in a sealed envelope for you to review at your convenience later.  YOU SHOULD EXPECT: Some feelings of bloating in the abdomen. Passage of more gas than usual.  Walking can help get rid of the air that was put into your GI tract  during the procedure and reduce the bloating. If you had a lower endoscopy (such as a colonoscopy or flexible sigmoidoscopy) you may notice spotting of blood in your stool or on the toilet paper. If you underwent a bowel prep for your procedure, then you may not have a normal bowel movement for a few days.  DIET: Your first meal following the procedure should be a light meal and then it is ok to progress to your normal diet.  A half-sandwich or bowl of soup is an example of a good first meal.  Heavy or fried foods are harder to digest and may make you feel nauseous or bloated.  Likewise meals heavy in dairy and vegetables can cause extra gas to form and this can also increase the bloating.  Drink plenty of fluids but you should avoid alcoholic beverages for 24 hours.  ACTIVITY: Your care partner should take you home directly after the procedure.  You should plan to take it easy, moving slowly for the rest of the day.  You can resume normal activity the day after the procedure however you should NOT DRIVE or use heavy machinery for 24 hours (because of the sedation medicines used during the test).    SYMPTOMS TO REPORT IMMEDIATELY: A  gastroenterologist can be reached at any hour.  During normal business hours, 8:30 AM to 5:00 PM Monday through Friday, call [phone].  After hours and on weekends, please call the GI answering service at [phone] who will take a message and have the physician on call contact you.   Following upper endoscopy (EGD)  Vomiting of blood or coffee ground material  New chest pain or pain under the shoulder blades  Painful or persistently difficult swallowing  New shortness of breath  Fever of 100F or higher  Black, tarry-looking stools  FOLLOW UP: If any biopsies were taken you will be contacted by phone or by letter within the next 1-3 weeks.  Call your gastroenterologist if you have not heard about the biopsies in 3 weeks.  Our staff will call the home number listed on your records the next business day following your procedure to check on you and address any questions or concerns that you may have at that time regarding the information given to you following your procedure. This is a courtesy call and so if there is no answer at the home number and we have not heard from you through the emergency physician on call, we will assume that you have returned to your regular daily activities without incident.  SIGNATURES/CONFIDENTIALITY: You and/or your care partner have signed paperwork which will be entered into your electronic medical record.  These signatures attest to the fact that  that the information above on your After Visit Summary has been reviewed and is understood.  Full responsibility of the confidentiality of this discharge information lies with you and/or your care-partner.

## 2013-06-03 ENCOUNTER — Telehealth: Payer: Self-pay | Admitting: *Deleted

## 2013-06-03 NOTE — Telephone Encounter (Signed)
Lm on vm of number given in admitting to return call if problems. ewm 

## 2013-06-07 ENCOUNTER — Encounter: Payer: Self-pay | Admitting: Gastroenterology

## 2013-07-09 ENCOUNTER — Other Ambulatory Visit (HOSPITAL_COMMUNITY): Payer: Self-pay | Admitting: Internal Medicine

## 2013-07-09 ENCOUNTER — Other Ambulatory Visit: Payer: Self-pay | Admitting: Internal Medicine

## 2013-07-10 ENCOUNTER — Other Ambulatory Visit (HOSPITAL_COMMUNITY): Payer: Self-pay | Admitting: Internal Medicine

## 2013-07-10 ENCOUNTER — Other Ambulatory Visit: Payer: Self-pay | Admitting: Internal Medicine

## 2013-07-10 ENCOUNTER — Encounter: Payer: Self-pay | Admitting: Internal Medicine

## 2013-07-10 NOTE — Telephone Encounter (Signed)
Patient needs an appointment

## 2013-07-22 ENCOUNTER — Other Ambulatory Visit (HOSPITAL_COMMUNITY): Payer: Self-pay | Admitting: Internal Medicine

## 2013-07-22 NOTE — Telephone Encounter (Signed)
Patient needs an appointment before refil.

## 2013-07-22 NOTE — Telephone Encounter (Signed)
Pt has an appointment this week

## 2013-07-24 ENCOUNTER — Ambulatory Visit (HOSPITAL_COMMUNITY)
Admission: RE | Admit: 2013-07-24 | Discharge: 2013-07-24 | Disposition: A | Discharge status: Home / Self Care | Payer: Medicare Other | Source: Ambulatory Visit | Attending: Internal Medicine | Admitting: Internal Medicine

## 2013-07-24 ENCOUNTER — Encounter: Payer: Self-pay | Admitting: Internal Medicine

## 2013-07-24 ENCOUNTER — Ambulatory Visit (INDEPENDENT_AMBULATORY_CARE_PROVIDER_SITE_OTHER): Payer: Medicare Other | Admitting: Internal Medicine

## 2013-07-24 VITALS — BP 111/67 | HR 77 | Temp 96.9°F | Wt 96.2 lb

## 2013-07-24 DIAGNOSIS — D649 Anemia, unspecified: Secondary | ICD-10-CM

## 2013-07-24 DIAGNOSIS — M810 Age-related osteoporosis without current pathological fracture: Secondary | ICD-10-CM

## 2013-07-24 DIAGNOSIS — R634 Abnormal weight loss: Secondary | ICD-10-CM | POA: Diagnosis not present

## 2013-07-24 DIAGNOSIS — M25839 Other specified joint disorders, unspecified wrist: Secondary | ICD-10-CM | POA: Diagnosis not present

## 2013-07-24 DIAGNOSIS — M25539 Pain in unspecified wrist: Secondary | ICD-10-CM | POA: Diagnosis not present

## 2013-07-24 LAB — CBC WITH DIFFERENTIAL/PLATELET
BASOS ABS: 0.1 10*3/uL (ref 0.0–0.1)
BASOS PCT: 1 % (ref 0–1)
Eosinophils Absolute: 0.2 10*3/uL (ref 0.0–0.7)
Eosinophils Relative: 4 % (ref 0–5)
HCT: 32.1 % — ABNORMAL LOW (ref 36.0–46.0)
Hemoglobin: 10.5 g/dL — ABNORMAL LOW (ref 12.0–15.0)
Lymphocytes Relative: 37 % (ref 12–46)
Lymphs Abs: 1.9 10*3/uL (ref 0.7–4.0)
MCH: 29.6 pg (ref 26.0–34.0)
MCHC: 32.7 g/dL (ref 30.0–36.0)
MCV: 90.4 fL (ref 78.0–100.0)
MONO ABS: 0.3 10*3/uL (ref 0.1–1.0)
Monocytes Relative: 6 % (ref 3–12)
Neutro Abs: 2.7 10*3/uL (ref 1.7–7.7)
Neutrophils Relative %: 52 % (ref 43–77)
Platelets: 211 10*3/uL (ref 150–400)
RBC: 3.55 MIL/uL — ABNORMAL LOW (ref 3.87–5.11)
RDW: 14.9 % (ref 11.5–15.5)
WBC: 5.2 10*3/uL (ref 4.0–10.5)

## 2013-07-24 LAB — BASIC METABOLIC PANEL WITH GFR
BUN: 27 mg/dL — AB (ref 6–23)
CO2: 28 meq/L (ref 19–32)
CREATININE: 0.98 mg/dL (ref 0.50–1.10)
Calcium: 9 mg/dL (ref 8.4–10.5)
Chloride: 104 mEq/L (ref 96–112)
GFR, Est African American: 65 mL/min
GFR, Est Non African American: 57 mL/min — ABNORMAL LOW
GLUCOSE: 74 mg/dL (ref 70–99)
POTASSIUM: 4.9 meq/L (ref 3.5–5.3)
Sodium: 140 mEq/L (ref 135–145)

## 2013-07-24 LAB — LIPID PANEL
CHOL/HDL RATIO: 2.8 ratio
Cholesterol: 142 mg/dL (ref 0–200)
HDL: 51 mg/dL (ref >39)
LDL Cholesterol: 77 mg/dL (ref 0–99)
Triglycerides: 71 mg/dL (ref <150)
VLDL: 14 mg/dL (ref 0–40)

## 2013-07-24 MED ORDER — HYDROCODONE-ACETAMINOPHEN 5-325 MG PO TABS: 1.0000 | ORAL_TABLET | Freq: Three times a day (TID) | ORAL | Status: DC | PRN

## 2013-07-24 MED ORDER — OMEPRAZOLE 40 MG PO CPDR: 40.0000 mg | DELAYED_RELEASE_CAPSULE | Freq: Two times a day (BID) | ORAL | Status: DC

## 2013-07-24 MED ORDER — RANITIDINE HCL 150 MG PO TABS: ORAL_TABLET | ORAL | Status: DC

## 2013-07-24 MED ORDER — FLUTICASONE PROPIONATE 50 MCG/ACT NA SUSP: 2.0000 | Freq: Every day | NASAL | Status: DC | PRN

## 2013-07-24 MED ORDER — MIRTAZAPINE 7.5 MG PO TABS: 7.5000 mg | ORAL_TABLET | Freq: Every day | ORAL | Status: DC

## 2013-07-24 MED ORDER — ACETAMINOPHEN 500 MG PO TABS: 500.0000 mg | ORAL_TABLET | Freq: Three times a day (TID) | ORAL | Status: AC

## 2013-07-24 MED ORDER — ALENDRONATE SODIUM 35 MG PO TABS: 35.0000 mg | ORAL_TABLET | ORAL | Status: DC

## 2013-07-24 MED ORDER — VITAMIN C 100 MG PO CHEW: 100.0000 mg | CHEWABLE_TABLET | Freq: Every day | ORAL | Status: DC

## 2013-07-24 NOTE — Assessment & Plan Note (Addendum)
Patient appears to have wrist fracture in late Sept 2014. She requests referral to hand specialist. I rec wrist xray today and referral. I will write for 60 tablet s of hydrocodone for the next month. I also rec taking 500 mg APAP TID. I instructed patient to avoid NSAIDs as she has history of PUD and gastritis. Patient agreed with this plan.

## 2013-07-24 NOTE — Assessment & Plan Note (Signed)
Today I elected to d/c intranasal calcitonin. Patient reports compliance with weekly fosfomax.

## 2013-07-24 NOTE — Assessment & Plan Note (Signed)
Appears stable. Will check CBC today.

## 2013-07-24 NOTE — Assessment & Plan Note (Signed)
Patients EGD revealed gastritis. Appears to be stable at this time. Plan the check lipid panel and BMP today.

## 2013-07-24 NOTE — Patient Instructions (Signed)
Please take Tylenol 500 mg three times daily for wrist pain.  Please take hydrocodone twice daily as needed for severe pain.  I will take an xray of your wrist today. I will call you with the results. I have referred you to a hand surgeon.  Please follow up in 2-3 months.

## 2013-07-24 NOTE — Progress Notes (Signed)
Patient ID: Allison Rojas, female   DOB: 25-May-1935, 78 y.o.   MRN: [id number]    Subjective:   Patient ID: Allison Rojas female   DOB: [birth year] 78 y.o.   MRN: [id number]  HPI: [name] is a 78 y.o. woman who presnts for routing f/u. Today she complains of ongoing wrist pain. She has had pain since falling in Sept/Oct 2014. She was seen in ED and dx with likely fracture. She apparently had f/u with hand surgery, but family states that they did not do anything. Patient has continued wrist pain and is not able to use her hand effectively. Patient requests pain medicine and referral to hand surgery.    Past Medical History  Diagnosis Date  . Gastritis   . Rib pain   . Osteoporosis   . Insomnia   . Peptic ulcer disease   . Chronic low back pain   . Irregular heart rate   . Compression fracture     T9 and T12 noted 05/2012 imaging. Followed by pain clinic   . Fall     Jan or Feb 2014   . Right wrist fracture     3 - 4 monthes ago  . Allergy   . GERD (gastroesophageal reflux disease)    Current Outpatient Prescriptions  Medication Sig Dispense Refill  . alendronate (FOSAMAX) 35 MG tablet Take 1 tablet (35 mg total) by mouth every 7 (seven) days. Take with a full glass of water on an empty stomach. Takes on Wed  12 tablet  1  . Ascorbic Acid (VITAMIN C) 100 MG CHEW Chew 1 tablet (100 mg total) by mouth daily.  90 each  0  . Cholecalciferol (VITAMIN D PO) Take 1 tablet by mouth daily.      . fluticasone (FLONASE) 50 MCG/ACT nasal spray Place 2 sprays into both nostrils daily as needed for rhinitis. For allergies  16 g  1  . HYDROcodone-acetaminophen (NORCO/VICODIN) 5-325 MG per tablet Take 1 tablet by mouth 3 (three) times daily as needed.  60 tablet  0  . omeprazole (PRILOSEC) 40 MG capsule Take 1 capsule (40 mg total) by mouth 2 (two) times daily. 30 minutes before breakfast, 30 minutes before dinner  60 capsule  6  . ranitidine (ZANTAC) 150 MG tablet TAKE 1 TABLET (150 MG TOTAL) BY  MOUTH 2 (TWO) TIMES DAILY.  60 tablet  4  . acetaminophen (TYLENOL) 500 MG tablet Take 1 tablet (500 mg total) by mouth 3 (three) times daily.  30 tablet  0   No current facility-administered medications for this visit.   Family History  Problem Relation Age of Onset  . Colon cancer Neg Hx   . Esophageal cancer Neg Hx   . Rectal cancer Neg Hx   . Stomach cancer Neg Hx    History   Social History  . Marital Status: Widowed    Spouse Name: N/A    Number of Children: N/A  . Years of Education: N/A   Social History Main Topics  . Smoking status: Never Smoker   . Smokeless tobacco: Never Used  . Alcohol Use: No  . Drug Use: No  . Sexual Activity: None   Other Topics Concern  . None   Social History Narrative   Financial assistance approved for 100% discount at [facility] and has [facility] card per [name]   11/30/2009   She is widowed, she has 2 children (only admits to 1 daughter now 07/2012), she does not smoke cigarettes  or drink alcohol   Lives with daughter Allison Rojas [phone]               Review of Systems: Review of Systems  Constitutional: Negative for fever and chills.  HENT: Negative for sore throat.   Eyes: Negative for blurred vision, double vision and photophobia.  Respiratory: Negative for cough, hemoptysis and sputum production.   Cardiovascular: Negative for chest pain, palpitations and orthopnea.  Gastrointestinal: Negative for heartburn, nausea, vomiting and abdominal pain.  Genitourinary: Negative for dysuria, urgency and frequency.  Musculoskeletal: Positive for back pain, joint pain and myalgias. Negative for falls and neck pain.  Skin: Negative for rash.  Neurological: Negative for weakness and headaches.    Objective:  Physical Exam: Filed Vitals:   07/24/13 0912  BP: 111/67  Pulse: 77  Temp: 96.9 F (36.1 C)  TempSrc: Oral  Weight: 96 lb 3.2 oz (43.636 kg)  SpO2: 97%   Physical Exam  Constitutional:  Frail elderly female.  HENT:    Head: Normocephalic.  Mouth/Throat: Oropharynx is clear and moist. No oropharyngeal exudate.  Cardiovascular: Normal rate, regular rhythm, normal heart sounds and intact distal pulses.  Exam reveals no friction rub.   No murmur heard. Pulmonary/Chest: Effort normal and breath sounds normal. No respiratory distress. She has no wheezes. She has no rales.  Abdominal: Soft. Bowel sounds are normal. She exhibits no distension. There is no tenderness.  Musculoskeletal:       Left wrist: She exhibits decreased range of motion, tenderness and bony tenderness. She exhibits no swelling, no effusion, no crepitus, no deformity and no laceration.  Neurological: She is alert.  Psychiatric: She has a normal mood and affect. Her behavior is normal.    Assessment & Plan:

## 2013-07-26 NOTE — Progress Notes (Signed)
Case discussed with Dr. Komanski at time of visit.  We reviewed the resident's history and exam and pertinent patient test results.  I agree with the assessment, diagnosis, and plan of care documented in the resident's note. 

## 2013-07-30 ENCOUNTER — Other Ambulatory Visit: Payer: Self-pay | Admitting: *Deleted

## 2013-07-31 MED ORDER — CYCLOBENZAPRINE HCL 10 MG PO TABS: 10.0000 mg | ORAL_TABLET | Freq: Three times a day (TID) | ORAL | Status: DC | PRN

## 2013-11-11 ENCOUNTER — Ambulatory Visit: Payer: Medicare Other | Admitting: Internal Medicine

## 2014-01-26 DIAGNOSIS — Z23 Encounter for immunization: Secondary | ICD-10-CM | POA: Diagnosis not present

## 2014-04-21 ENCOUNTER — Other Ambulatory Visit: Payer: Self-pay | Admitting: *Deleted

## 2014-04-22 MED ORDER — ALENDRONATE SODIUM 35 MG PO TABS: 35.0000 mg | ORAL_TABLET | ORAL | Status: DC

## 2014-07-15 ENCOUNTER — Other Ambulatory Visit: Payer: Self-pay | Admitting: *Deleted

## 2014-07-15 DIAGNOSIS — K2100 Gastro-esophageal reflux disease with esophagitis, without bleeding: Secondary | ICD-10-CM

## 2014-07-15 MED ORDER — OMEPRAZOLE 20 MG PO CPDR: 20.0000 mg | DELAYED_RELEASE_CAPSULE | Freq: Two times a day (BID) | ORAL | Status: DC

## 2014-07-15 NOTE — Telephone Encounter (Signed)
Please schedule Allison Rojas with Dr. Isabella BowensKrall at her next available non-overbook appointment.  I provided 3 months of omeprazole but further refills will require her to be seen in the clinic since it has been nearly 1 year since she was last seen.

## 2014-07-18 NOTE — Telephone Encounter (Signed)
Message to front desk to schedule appointment 

## 2014-07-22 ENCOUNTER — Encounter: Payer: Self-pay | Admitting: Pulmonary Disease

## 2014-08-06 ENCOUNTER — Telehealth: Payer: Self-pay | Admitting: Gastroenterology

## 2014-08-07 MED ORDER — ESOMEPRAZOLE MAGNESIUM 40 MG PO CPDR: 40.0000 mg | DELAYED_RELEASE_CAPSULE | Freq: Every day | ORAL | Status: DC

## 2014-08-07 NOTE — Telephone Encounter (Signed)
The pt's daughter spoke to me regarding the pt due to language barriers.  She states the pt is not getting relief from omeprazole and would like to change to nexium.  I sent the nexium and advised her to call back in 1 week if the symptoms do not improve.  She agreed and thanked me for calling

## 2014-08-21 ENCOUNTER — Encounter: Payer: Medicare Other | Admitting: Pulmonary Disease

## 2014-12-30 ENCOUNTER — Ambulatory Visit: Payer: Medicare Other | Admitting: Internal Medicine

## 2015-01-12 DIAGNOSIS — Z23 Encounter for immunization: Secondary | ICD-10-CM | POA: Diagnosis not present

## 2015-01-30 ENCOUNTER — Ambulatory Visit (INDEPENDENT_AMBULATORY_CARE_PROVIDER_SITE_OTHER): Payer: Medicare Other | Admitting: Internal Medicine

## 2015-01-30 VITALS — BP 113/68 | HR 72 | Temp 98.5°F | Ht 59.5 in | Wt 87.8 lb

## 2015-01-30 DIAGNOSIS — M79605 Pain in left leg: Secondary | ICD-10-CM | POA: Diagnosis not present

## 2015-01-30 DIAGNOSIS — R2681 Unsteadiness on feet: Secondary | ICD-10-CM | POA: Diagnosis not present

## 2015-01-30 DIAGNOSIS — D6489 Other specified anemias: Secondary | ICD-10-CM | POA: Diagnosis not present

## 2015-01-30 DIAGNOSIS — Z79899 Other long term (current) drug therapy: Secondary | ICD-10-CM | POA: Diagnosis not present

## 2015-01-30 DIAGNOSIS — M79604 Pain in right leg: Secondary | ICD-10-CM | POA: Diagnosis not present

## 2015-01-30 DIAGNOSIS — D649 Anemia, unspecified: Secondary | ICD-10-CM

## 2015-01-30 DIAGNOSIS — M810 Age-related osteoporosis without current pathological fracture: Secondary | ICD-10-CM | POA: Diagnosis not present

## 2015-01-30 MED ORDER — HYDROCODONE-ACETAMINOPHEN 5-325 MG PO TABS: 1.0000 | ORAL_TABLET | Freq: Three times a day (TID) | ORAL | Status: DC | PRN

## 2015-01-30 MED ORDER — ALENDRONATE SODIUM 35 MG PO TABS: 35.0000 mg | ORAL_TABLET | ORAL | Status: DC

## 2015-01-31 LAB — CBC
HEMATOCRIT: 31.1 % — AB (ref 34.0–46.6)
HEMOGLOBIN: 10.1 g/dL — AB (ref 11.1–15.9)
MCH: 29.2 pg (ref 26.6–33.0)
MCHC: 32.5 g/dL (ref 31.5–35.7)
MCV: 90 fL (ref 79–97)
Platelets: 207 10*3/uL (ref 150–379)
RBC: 3.46 x10E6/uL — ABNORMAL LOW (ref 3.77–5.28)
RDW: 15.9 % — ABNORMAL HIGH (ref 12.3–15.4)
WBC: 5.1 10*3/uL (ref 3.4–10.8)

## 2015-01-31 LAB — BMP8+ANION GAP
Anion Gap: 16 mmol/L (ref 10.0–18.0)
BUN / CREAT RATIO: 17 (ref 11–26)
BUN: 25 mg/dL (ref 8–27)
CO2: 25 mmol/L (ref 18–29)
Calcium: 9 mg/dL (ref 8.7–10.3)
Chloride: 98 mmol/L (ref 97–108)
Creatinine, Ser: 1.45 mg/dL — ABNORMAL HIGH (ref 0.57–1.00)
GFR calc non Af Amer: 35 mL/min/{1.73_m2} — ABNORMAL LOW (ref >59)
GFR, EST AFRICAN AMERICAN: 40 mL/min/{1.73_m2} — AB (ref >59)
GLUCOSE: 95 mg/dL (ref 65–99)
POTASSIUM: 4.6 mmol/L (ref 3.5–5.2)
SODIUM: 139 mmol/L (ref 134–144)

## 2015-01-31 LAB — HEPATIC FUNCTION PANEL
ALT: 9 [IU]/L (ref 0–32)
AST: 19 [IU]/L (ref 0–40)
Albumin: 4.4 g/dL (ref 3.5–4.8)
Alkaline Phosphatase: 43 [IU]/L (ref 39–117)
Bilirubin Total: <0.2 mg/dL (ref 0.0–1.2)
Bilirubin, Direct: 0.08 mg/dL (ref 0.00–0.40)
Total Protein: 6.6 g/dL (ref 6.0–8.5)

## 2015-01-31 LAB — VITAMIN B12: Vitamin B-12: 965 pg/mL — ABNORMAL HIGH (ref 211–946)

## 2015-01-31 LAB — VITAMIN D 25 HYDROXY (VIT D DEFICIENCY, FRACTURES): Vit D, 25-Hydroxy: 72.1 ng/mL (ref 30.0–100.0)

## 2015-02-01 DIAGNOSIS — G8929 Other chronic pain: Secondary | ICD-10-CM | POA: Insufficient documentation

## 2015-02-01 NOTE — Assessment & Plan Note (Addendum)
Patient appears frail and has severe kyphosis of her spine. DEXA scan from 2012 showing T score -3.2 in spine, -3.3 in L femur, and -3.6 in R femur. She was previously prescribed Fosamax but never took the medication. Daughter reported she was hesitant on starting her mother on this medication due to her history of GERD and gastritis.  -Explained to her that the patient is high risk for fractures, as such, try Fosamax and see how she tolerates it. Patient was instructed how to take the drug correctly to prevent acid reflux.  -If patient cannot tolerate it, next step will be Zoledronic acid infusions.

## 2015-02-01 NOTE — Assessment & Plan Note (Addendum)
Hgb 10.5 in 07/2013 with normal MCV. Ferritin and Folate normal. -F/u CBC

## 2015-02-01 NOTE — Progress Notes (Signed)
Patient ID: Charlene BrookeKyungim Brockman, female   DOB: 1935/05/18, 79 y.o.   MRN: [id number]   Subjective:   Patient ID: Charlene BrookeKyungim Mahar female   DOB: 1935/05/18 [age] y.o.   MRN: [id number]  HPI: Ms.Ahana Nedra HaiLee is a 79 y.o. [place] speaking F with a PMHx of conditions listed below presenting to the clinic for a follow-up visit. Patient was last seen in 07/2013. States she has history of leg cramps for over 10 years but the pain has been worse in the past 3-4 years. Patient has been taking Tramadol, Vicodin, and other OTC pain pain medicine. Reports having cramps, numbness, and tingling in her feet bilaterally. In addition, reports having bilateral calf muscle pain. Patient states her pain is worse at night when lying down. She was accompanied by her daughter who stated patient does not ambulate much due to her chronic lower back pain. Patient denies having any leg swelling. Denies any fevers, chills, CP, or SOB.   Interpreter services used. Interpreter name Knute Neu- Youngjoo, ID -- [id number].     Past Medical History  Diagnosis Date  . Gastritis   . Rib pain   . Osteoporosis   . Insomnia   . Peptic ulcer disease   . Chronic low back pain   . Irregular heart rate   . Compression fracture     T9 and T12 noted 05/2012 imaging. Followed by pain clinic   . Fall     Jan or Feb 2014   . Right wrist fracture     3 - 4 monthes ago  . Allergy   . GERD (gastroesophageal reflux disease)    Current Outpatient Prescriptions  Medication Sig Dispense Refill  . acetaminophen (TYLENOL) 500 MG tablet Take 1 tablet (500 mg total) by mouth 3 (three) times daily. 30 tablet 0  . alendronate (FOSAMAX) 35 MG tablet Take 1 tablet (35 mg total) by mouth every 7 (seven) days. Take with a full glass of water on an empty stomach. Takes on Wed 12 tablet 1  . Ascorbic Acid (VITAMIN C) 100 MG CHEW Chew 1 tablet (100 mg total) by mouth daily. 90 each 0  . Cholecalciferol (VITAMIN D PO) Take 1 tablet by mouth daily.    . cyclobenzaprine (FLEXERIL) 10 MG  tablet Take 1 tablet (10 mg total) by mouth 3 (three) times daily as needed for muscle spasms (leg pain). 30 tablet 0  . esomeprazole (NEXIUM) 40 MG capsule Take 1 capsule (40 mg total) by mouth daily at 12 noon. 30 capsule 6  . fluticasone (FLONASE) 50 MCG/ACT nasal spray Place 2 sprays into both nostrils daily as needed for rhinitis. For allergies 16 g 1  . HYDROcodone-acetaminophen (NORCO/VICODIN) 5-325 MG tablet Take 1 tablet by mouth 3 (three) times daily as needed. 60 tablet 0   No current facility-administered medications for this visit.   Family History  Problem Relation Age of Onset  . Colon cancer Neg Hx   . Esophageal cancer Neg Hx   . Rectal cancer Neg Hx   . Stomach cancer Neg Hx    Social History   Social History  . Marital Status: Widowed    Spouse Name: N/A  . Number of Children: N/A  . Years of Education: N/A   Social History Main Topics  . Smoking status: Never Smoker   . Smokeless tobacco: Never Used  . Alcohol Use: No  . Drug Use: No  . Sexual Activity: Not on file   Other Topics Concern  . Not on  file   Social History Narrative   Financial assistance approved for 100% discount at [facility] and [facility] per [name]   11/30/2009   She is widowed, she has 2 children (only admits to 1 daughter now 07/2012), she does not smoke cigarettes or drink alcohol   Lives with daughter Lynae Pederson [phone]               Review of Systems: Review of Systems  Constitutional: Negative for fever and chills.  HENT: Negative for ear pain.   Eyes: Negative for double vision and pain.  Respiratory: Negative for cough, shortness of breath and wheezing.   Cardiovascular: Negative for chest pain and leg swelling.  Gastrointestinal: Negative for nausea, vomiting and abdominal pain.  Genitourinary: Negative for dysuria, urgency and frequency.  Musculoskeletal: Positive for back pain.       Bilateral calf and feet cramps  Skin: Negative for itching and rash.    Neurological: Negative for sensory change, focal weakness and headaches.   Objective:  Physical Exam: Filed Vitals:   01/30/15 1552  BP: 113/68  Pulse: 72  Temp: 98.5 F (36.9 C)  TempSrc: Oral  Height: 4' 11.5" (1.511 m)  Weight: 87 lb 12.8 oz (39.826 kg)  SpO2: 99%   Physical Exam  Constitutional: She is oriented to person, place, and time. No distress.  Underweight and frail appearing   HENT:  Head: Normocephalic and atraumatic.  Eyes: EOM are normal. Pupils are equal, round, and reactive to light.  Neck: Neck supple. No tracheal deviation present.  Cardiovascular: Normal rate, regular rhythm and intact distal pulses.   Pulmonary/Chest: Effort normal. No respiratory distress. She has no wheezes. She has no rales.  Abdominal: Soft. Bowel sounds are normal. She exhibits no distension. There is no tenderness.  Musculoskeletal: She exhibits no edema.  Severe kyphosis of back noted.  Bilateral lower extremities: Symmetrical in size. No tenderness of calf muscle on palpation. Homan's sign negative. Dorsalis pedis pulse +2. Pain in feet upon plantarflexion. No skin changes noted.   Neurological: She is alert and oriented to person, place, and time.  Skin: Skin is warm and dry.   Assessment & Plan:

## 2015-02-01 NOTE — Assessment & Plan Note (Signed)
Patient complaining of chronic bilateral calf muscle cramps and pain. In addition, reports cramps, numbness, and tingling of her feet bilaterally. Pain does not ambulate much and reports having the pain when lying down at night. Her symptoms could be due to electrolyte abnormalities (hypokalemia). Vitamin B12 deficiency could possibly explain the numbness and tingling in her feet. Not likely venous insufficiency because no skin changes noted. Not likely peripheral arterial disease because pulses strong and intact in b/l LEs. Not likely DVT because both the extremities were symmetrical, no calf muscle tenderness, Homan's sign negative. Denies any fevers, chills, CP, or SOB.  -Vicodin 5-325 1 tab TID prn refilled, #60 tabs.  -F/u BMP -F/u B12 -F/u LFTs

## 2015-03-16 NOTE — Progress Notes (Signed)
Internal Medicine Clinic Attending  I saw and evaluated the patient.  I personally confirmed the key portions of the history and exam documented by Dr. Rathore and I reviewed pertinent patient test results.  The assessment, diagnosis, and plan were formulated together and I agree with the documentation in the resident's note.  

## 2015-03-25 ENCOUNTER — Telehealth: Payer: Self-pay | Admitting: Gastroenterology

## 2015-03-25 ENCOUNTER — Telehealth: Payer: Self-pay | Admitting: Pulmonary Disease

## 2015-03-25 NOTE — Telephone Encounter (Signed)
Patient wants tramadol instead of hydrocodone because it works better.

## 2015-03-26 NOTE — Telephone Encounter (Signed)
Can you please reroute this request to Dr. Loney Lohathore who saw the patient recently for this problem and prescribed the Norco?

## 2015-03-26 NOTE — Telephone Encounter (Signed)
Pt's daughter will find out what is covered by the insurance and call back, she was advised that if that does not work we can then do a prior auth and try to get nexium approved.  Pts daughter agreed

## 2015-03-27 NOTE — Telephone Encounter (Signed)
i called pt's # twice, 2nd time a woman answers and states she is pt's daughter, i explained that pt needed an appt and she states she cannot bring her to the doctor because she must work, i ask when she thought she may be able to bring pt in for visit she stated she did not know and just have dr to give pt tramadol. i informed her that doctor will want to see pt she states she might call back sometime and goodbye

## 2015-03-30 NOTE — Telephone Encounter (Signed)
Thank you for calling the patient. She needs an appointment to discuss her pain.

## 2015-04-06 ENCOUNTER — Other Ambulatory Visit: Payer: Self-pay | Admitting: Gastroenterology

## 2015-05-11 ENCOUNTER — Other Ambulatory Visit: Payer: Self-pay | Admitting: Pulmonary Disease

## 2015-05-11 NOTE — Telephone Encounter (Signed)
Pharmacy called and last refill 02/06/15

## 2015-05-11 NOTE — Telephone Encounter (Signed)
Pt requesting hydrocodone to be filled.  °

## 2015-05-12 ENCOUNTER — Encounter: Payer: Self-pay | Admitting: Pulmonary Disease

## 2015-05-12 NOTE — Telephone Encounter (Signed)
Message to front pool to try and schedule pt.

## 2015-05-20 ENCOUNTER — Encounter: Payer: Self-pay | Admitting: Pulmonary Disease

## 2015-06-18 ENCOUNTER — Encounter: Payer: Medicare Other | Admitting: Pulmonary Disease

## 2015-06-25 ENCOUNTER — Other Ambulatory Visit: Payer: Self-pay | Admitting: Internal Medicine

## 2015-06-25 ENCOUNTER — Encounter: Payer: Self-pay | Admitting: Pulmonary Disease

## 2015-06-25 ENCOUNTER — Ambulatory Visit (INDEPENDENT_AMBULATORY_CARE_PROVIDER_SITE_OTHER): Payer: Medicare Other | Admitting: Pulmonary Disease

## 2015-06-25 VITALS — BP 132/73 | HR 80 | Temp 98.5°F | Ht 59.0 in | Wt 85.0 lb

## 2015-06-25 DIAGNOSIS — Z23 Encounter for immunization: Secondary | ICD-10-CM | POA: Diagnosis not present

## 2015-06-25 DIAGNOSIS — M79605 Pain in left leg: Secondary | ICD-10-CM

## 2015-06-25 DIAGNOSIS — M79604 Pain in right leg: Secondary | ICD-10-CM

## 2015-06-25 DIAGNOSIS — Z0000 Encounter for general adult medical examination without abnormal findings: Secondary | ICD-10-CM

## 2015-06-25 DIAGNOSIS — N183 Chronic kidney disease, stage 3 unspecified: Secondary | ICD-10-CM | POA: Insufficient documentation

## 2015-06-25 DIAGNOSIS — N179 Acute kidney failure, unspecified: Secondary | ICD-10-CM | POA: Diagnosis not present

## 2015-06-25 MED ORDER — TRAMADOL HCL 50 MG PO TABS: 50.0000 mg | ORAL_TABLET | Freq: Three times a day (TID) | ORAL | Status: DC | PRN

## 2015-06-25 MED ORDER — DICLOFENAC SODIUM 1 % TD GEL: 4.0000 g | Freq: Four times a day (QID) | TRANSDERMAL | Status: DC

## 2015-06-25 MED ORDER — CYCLOBENZAPRINE HCL 10 MG PO TABS: 10.0000 mg | ORAL_TABLET | Freq: Two times a day (BID) | ORAL | Status: DC | PRN

## 2015-06-25 MED ORDER — RANITIDINE HCL 150 MG PO CAPS: 150.0000 mg | ORAL_CAPSULE | Freq: Two times a day (BID) | ORAL | Status: DC | PRN

## 2015-06-25 NOTE — Patient Instructions (Signed)
For your leg cramps, you can try either vitamin B complex (three times daily, containing 30 mg of vitamin B6) or vitamin E (800 international units before bed).

## 2015-06-26 LAB — BMP8+ANION GAP
Anion Gap: 19 mmol/L — ABNORMAL HIGH (ref 10.0–18.0)
BUN / CREAT RATIO: 25 (ref 11–26)
BUN: 24 mg/dL (ref 8–27)
CHLORIDE: 104 mmol/L (ref 96–106)
CO2: 20 mmol/L (ref 18–29)
CREATININE: 0.97 mg/dL (ref 0.57–1.00)
Calcium: 8.7 mg/dL (ref 8.7–10.3)
GFR calc Af Amer: 65 mL/min/{1.73_m2} (ref >59)
GFR calc non Af Amer: 57 mL/min/{1.73_m2} — ABNORMAL LOW (ref >59)
GLUCOSE: 88 mg/dL (ref 65–99)
POTASSIUM: 4.4 mmol/L (ref 3.5–5.2)
SODIUM: 143 mmol/L (ref 134–144)

## 2015-06-26 NOTE — Telephone Encounter (Signed)
Last appt 06/25/15; no f/u appt.

## 2015-06-28 DIAGNOSIS — Z0000 Encounter for general adult medical examination without abnormal findings: Secondary | ICD-10-CM | POA: Insufficient documentation

## 2015-06-28 NOTE — Progress Notes (Signed)
   Subjective:    Patient ID: Allison BrookeKyungim Schall, female    DOB: 08-Nov-1935, 80 y.o.   MRN: [id number]  HPI 80 year old woman with osteoporosis, GERD, compression fracture presenting for follow-up of chronic pain associated with compression fractures.  She is accompanied by her daughter. Her daughter reports that she has been having pain in her back. She is also having bilateral lower extremity lateral calf cramping. She has been having both of these problems for over 10 years. Her calves hurt more when she is lying down especially around midnight. She does not have restless legs. She takes 1 tablet of Norco 3 times per day. She takes 1 tablet of cyclobenzaprine 2 times a day. She would like to switch Norco to tramadol.  Review of Systems Constitutional: no fevers/chills Eyes: no vision changes Ears, nose, mouth, throat, and face: no cough Respiratory: no shortness of breath Cardiovascular: no chest pain  Past Medical History  Diagnosis Date  . Gastritis   . Rib pain   . Osteoporosis   . Insomnia   . Peptic ulcer disease   . Chronic low back pain   . Irregular heart rate   . Compression fracture     T9 and T12 noted 05/2012 imaging. Followed by pain clinic   . Fall     Jan or Feb 2014   . Right wrist fracture     3 - 4 monthes ago  . Allergy   . GERD (gastroesophageal reflux disease)     Current Outpatient Prescriptions on File Prior to Visit  Medication Sig Dispense Refill  . acetaminophen (TYLENOL) 500 MG tablet Take 1 tablet (500 mg total) by mouth 3 (three) times daily. 30 tablet 0  . Ascorbic Acid (VITAMIN C) 100 MG CHEW Chew 1 tablet (100 mg total) by mouth daily. 90 each 0  . Cholecalciferol (VITAMIN D PO) Take 1 tablet by mouth daily.    . fluticasone (FLONASE) 50 MCG/ACT nasal spray Place 2 sprays into both nostrils daily as needed for rhinitis. For allergies 16 g 1   No current facility-administered medications on file prior to visit.    Objective:   Physical Exam  Blood  pressure 132/73, pulse 80, temperature 98.5 F (36.9 C), temperature source Oral, height 4\' 11"  (1.499 m), weight 85 lb (38.556 kg), SpO2 100 %. General Apperance: NAD HEENT: Normocephalic, atraumatic, anicteric sclera Neck: Supple, trachea midline Lungs: Clear to auscultation bilaterally. No wheezes, rhonchi or rales. Breathing comfortably Heart: Regular rate and rhythm, no murmur/rub/gallop Abdomen: Soft, nontender, nondistended, no rebound/guarding Extremities: Warm and well perfused, no edema Skin: No rashes or lesions Neurologic: Alert and interactive. No gross deficits.    Assessment & Plan:  Bilateral leg pain Assessment: Chronic nocturnal bilateral calf cramps. BMP, B12, LFTs noncontributory.  Plan:  -Tramadol 50mg  TID prn -Refill Flexeril 10mg  BID prn -Voltaren gel refilled -Suggested trying mineral and vitamin supplementation, including vitamin B complex (three times daily, containing 30 mg of vitamin B6) or vitamin E (800 international units before bed).  Health care maintenance Administered Prevnar 13 Already received influenza vaccine  AKI (acute kidney injury) (HCC) BMP rechecked. AKI resolved.   Griffin BasilJennifer Peggie Hornak, MD  Internal Medicine Teaching Service PGY-2 06/28/2015 7:04 PM

## 2015-06-28 NOTE — Assessment & Plan Note (Addendum)
Assessment: Chronic nocturnal bilateral calf cramps. BMP, B12, LFTs noncontributory.  Plan:  -Tramadol 50mg  TID prn -Refill Flexeril 10mg  BID prn -Voltaren gel refilled -Suggested trying mineral and vitamin supplementation, including vitamin B complex (three times daily, containing 30 mg of vitamin B6) or vitamin E (800 international units before bed).

## 2015-06-28 NOTE — Assessment & Plan Note (Addendum)
Administered Prevnar 13 Already received influenza vaccine

## 2015-06-28 NOTE — Assessment & Plan Note (Signed)
BMP rechecked. AKI resolved.

## 2015-06-30 NOTE — Progress Notes (Signed)
Internal Medicine Clinic Attending  Case discussed with Dr. Krall at the time of the visit.  We reviewed the resident's history and exam and pertinent patient test results.  I agree with the assessment, diagnosis, and plan of care documented in the resident's note.  

## 2015-07-28 ENCOUNTER — Other Ambulatory Visit: Payer: Self-pay | Admitting: Internal Medicine

## 2015-10-13 ENCOUNTER — Telehealth: Payer: Self-pay | Admitting: Pulmonary Disease

## 2015-10-13 NOTE — Telephone Encounter (Signed)
Asking for hydrocodone refill instead of tramadol

## 2015-10-14 ENCOUNTER — Other Ambulatory Visit: Payer: Self-pay | Admitting: *Deleted

## 2015-10-14 MED ORDER — RANITIDINE HCL 150 MG PO TABS: ORAL_TABLET | ORAL | Status: DC

## 2015-10-14 MED ORDER — ALENDRONATE SODIUM 35 MG PO TABS: ORAL_TABLET | ORAL | Status: DC

## 2015-10-16 NOTE — Telephone Encounter (Signed)
Called pt, spoke w/ relative, appt made for 7/27, she will wait til then

## 2015-11-12 ENCOUNTER — Encounter: Payer: Self-pay | Admitting: Pulmonary Disease

## 2015-11-12 ENCOUNTER — Ambulatory Visit (INDEPENDENT_AMBULATORY_CARE_PROVIDER_SITE_OTHER): Payer: Medicare Other | Admitting: Pulmonary Disease

## 2015-11-12 DIAGNOSIS — R634 Abnormal weight loss: Secondary | ICD-10-CM | POA: Diagnosis not present

## 2015-11-12 DIAGNOSIS — G8929 Other chronic pain: Secondary | ICD-10-CM

## 2015-11-12 DIAGNOSIS — M545 Low back pain: Secondary | ICD-10-CM

## 2015-11-12 DIAGNOSIS — G4700 Insomnia, unspecified: Secondary | ICD-10-CM | POA: Diagnosis not present

## 2015-11-12 MED ORDER — HYDROCODONE-ACETAMINOPHEN 5-325 MG PO TABS: 1.0000 | ORAL_TABLET | Freq: Two times a day (BID) | ORAL | 0 refills | Status: DC | PRN

## 2015-11-12 MED ORDER — DICLOFENAC SODIUM 1 % TD GEL: 4.0000 g | Freq: Four times a day (QID) | TRANSDERMAL | 0 refills | Status: AC

## 2015-11-12 MED ORDER — CYCLOBENZAPRINE HCL 10 MG PO TABS: ORAL_TABLET | ORAL | 1 refills | Status: DC

## 2015-11-12 NOTE — Progress Notes (Signed)
   CC: back pain  HPI:  [name] is a 80 year old woman with osteoporosis, GERD, compression fracture presenting for follow-up of chronic pain associated with compression fractures.  She is accompanied by her daughter. Her daughter reports that she does not want to use the tramadol anymore and wants to go back on hydrocodone. She also reports she has trouble staying asleep at night. She does watch TV prior to bed.   Past Medical History:  Diagnosis Date  . Allergy   . Chronic low back pain   . Compression fracture    T9 and T12 noted 05/2012 imaging. Followed by pain clinic   . Fall    Jan or Feb 2014   . Gastritis   . GERD (gastroesophageal reflux disease)   . Insomnia   . Irregular heart rate   . Osteoporosis   . Peptic ulcer disease   . Rib pain   . Right wrist fracture    3 - 4 monthes ago    Review of Systems:    Constitutional: no fevers/chills Eyes: no vision changes Ears, nose, mouth, throat, and face: no cough  Physical Exam:  Vitals:   11/12/15 1603  BP: (!) 114/58  Pulse: 77  Temp: 98 F (36.7 C)  TempSrc: Oral  SpO2: 100%  Weight: 40.6 kg (89 lb 8 oz)  Height: 4\' 11"  (1.499 m)   General Apperance: NAD HEENT: Normocephalic, atraumatic, anicteric sclera Neck: Supple, trachea midline Lungs: Clear to auscultation bilaterally. No wheezes, rhonchi or rales. Breathing comfortably Heart: Regular rate and rhythm, no murmur/rub/gallop Abdomen: Soft, nontender, nondistended, no rebound/guarding Extremities: Warm and well perfused, no edema Skin: No rashes or lesions Neurologic: Alert and interactive. No gross deficits.   Assessment & Plan:   See Encounters Tab for problem based charting.  Patient discussed with Dr. Criselda Peaches

## 2015-11-15 NOTE — Assessment & Plan Note (Signed)
Assessment: Ongoing insomnia. Would not add any medications as she is at risk for polypharmacy.   Plan: Discussed sleep hygiene with daughter who agrees to reinforce this with her mother.

## 2015-11-15 NOTE — Assessment & Plan Note (Signed)
Assessment: Was undergoing workup with GI. Discussed follow up with GI  Plan: Daughter will consider at this time but not ready to have appointment made.

## 2015-11-15 NOTE — Assessment & Plan Note (Addendum)
Assessment: Frequently wants her pain medications changed from tramadol to hydrocodone and back. Discussed with her daughter that she is at high risk for falls especially with the medications she is on. The patient understands these risks but insists on being on them.   Plan: Change back to Norco 5-325mg  BID prn severe pain. Discussed with the patient and her daughter that we will not be changing her pain medications again and if she finds that the medication is ineffective in the future, we will titrate them off. Continue Flexeril at current dose Increase Voltaren gel to scheduled QID. Tylenol OTC prn mild to moderate pain. Consider referral to sports medicine in the future.

## 2015-11-16 NOTE — Progress Notes (Signed)
Internal Medicine Clinic Attending  Case discussed with Dr. Krall at the time of the visit.  We reviewed the resident's history and exam and pertinent patient test results.  I agree with the assessment, diagnosis, and plan of care documented in the resident's note.  

## 2016-01-11 DIAGNOSIS — Z23 Encounter for immunization: Secondary | ICD-10-CM | POA: Diagnosis not present

## 2016-01-22 ENCOUNTER — Other Ambulatory Visit: Payer: Self-pay | Admitting: Pulmonary Disease

## 2016-01-22 NOTE — Telephone Encounter (Signed)
Last refill 11/12/15 x 2 rxs. Last appt 11/12/15. No f/u appt scheduled. UDS - none

## 2016-01-22 NOTE — Telephone Encounter (Signed)
Pt requesting pain med refill 

## 2016-01-25 MED ORDER — HYDROCODONE-ACETAMINOPHEN 5-325 MG PO TABS: 1.0000 | ORAL_TABLET | Freq: Two times a day (BID) | ORAL | 0 refills | Status: DC | PRN

## 2016-01-25 NOTE — Telephone Encounter (Signed)
Pt was informed by chilonb.

## 2016-01-25 NOTE — Telephone Encounter (Signed)
As this patient was seen by Dr. Isabella BowensKrall on 11/12/15 and deemed necessary for their treatment, I will refill this prescription on her behalf for Vicodin 5/325 x 60 tablets to be taken 1 tablet every 12 hours as needed for pain. Per review of the [place] DEA database, this medication was last refilled 12/25/15 and has not been refilled over the time interval.

## 2016-02-21 ENCOUNTER — Other Ambulatory Visit: Payer: Self-pay | Admitting: Pulmonary Disease

## 2016-03-02 ENCOUNTER — Other Ambulatory Visit: Payer: Self-pay

## 2016-03-02 ENCOUNTER — Telehealth: Payer: Self-pay

## 2016-03-02 NOTE — Telephone Encounter (Signed)
HYDROcodone-acetaminophen, requesting refill

## 2016-03-02 NOTE — Telephone Encounter (Signed)
HYDROcodone-acetaminophen , refill request 

## 2016-03-08 ENCOUNTER — Other Ambulatory Visit: Payer: Self-pay

## 2016-03-08 MED ORDER — HYDROCODONE-ACETAMINOPHEN 5-325 MG PO TABS: 1.0000 | ORAL_TABLET | Freq: Two times a day (BID) | ORAL | 0 refills | Status: DC | PRN

## 2016-03-08 NOTE — Telephone Encounter (Signed)
Called pt's daughter- informed rxs ready for pick-up; stated she will come tomorrow (made awared we will close at 12noon).

## 2016-03-08 NOTE — Telephone Encounter (Signed)
Pt daughter want to know if pain meds is ready. Please call pt back.

## 2016-03-08 NOTE — Telephone Encounter (Signed)
Last written rx 10/9 and picked up 01/26/16. Last appt 11/12/15; no f/u appt. RTC to pt's daughter - no answer; left message rx refill request  was not done last week and I'm sending it today.

## 2016-03-27 ENCOUNTER — Other Ambulatory Visit: Payer: Self-pay | Admitting: Gastroenterology

## 2016-03-28 ENCOUNTER — Telehealth: Payer: Self-pay | Admitting: *Deleted

## 2016-03-28 NOTE — Telephone Encounter (Signed)
[facility] insurance will not cover Esomeprazole but will cover Omeprazole , Pantoprazole and dexilant. Not on current med list. Please send new rx if appropriate.

## 2016-03-30 NOTE — Telephone Encounter (Signed)
Pharmacy informed.

## 2016-03-30 NOTE — Telephone Encounter (Signed)
Will have her stick to ranitidine unless she wants to switch to PPI. Thanks!

## 2016-04-26 ENCOUNTER — Encounter: Payer: Self-pay | Admitting: Internal Medicine

## 2016-06-10 ENCOUNTER — Telehealth: Payer: Self-pay | Admitting: Pulmonary Disease

## 2016-06-10 MED ORDER — HYDROCODONE-ACETAMINOPHEN 5-325 MG PO TABS: 1.0000 | ORAL_TABLET | Freq: Two times a day (BID) | ORAL | 0 refills | Status: DC | PRN

## 2016-06-10 NOTE — Telephone Encounter (Signed)
Pain med refill °

## 2016-06-10 NOTE — Telephone Encounter (Signed)
Please have her follow up in next 1-2 months

## 2016-06-13 NOTE — Telephone Encounter (Signed)
PCP's first is not until June.  Appt made for 09-22-16 @ 2:15 pm.  Appt card has been mailed.  Forwarding back to Dr. Isabella BowensKrall to make her aware.

## 2016-09-22 ENCOUNTER — Encounter: Payer: Self-pay | Admitting: Pulmonary Disease

## 2016-09-22 ENCOUNTER — Ambulatory Visit (INDEPENDENT_AMBULATORY_CARE_PROVIDER_SITE_OTHER): Payer: Medicare Other | Admitting: Pulmonary Disease

## 2016-09-22 VITALS — BP 157/71 | HR 72 | Temp 98.0°F | Ht 59.0 in | Wt 88.0 lb

## 2016-09-22 DIAGNOSIS — M7989 Other specified soft tissue disorders: Secondary | ICD-10-CM | POA: Diagnosis not present

## 2016-09-22 DIAGNOSIS — G8929 Other chronic pain: Secondary | ICD-10-CM

## 2016-09-22 DIAGNOSIS — Z79891 Long term (current) use of opiate analgesic: Secondary | ICD-10-CM

## 2016-09-22 DIAGNOSIS — Z87310 Personal history of (healed) osteoporosis fracture: Secondary | ICD-10-CM

## 2016-09-22 DIAGNOSIS — M810 Age-related osteoporosis without current pathological fracture: Secondary | ICD-10-CM

## 2016-09-22 DIAGNOSIS — R030 Elevated blood-pressure reading, without diagnosis of hypertension: Secondary | ICD-10-CM

## 2016-09-22 DIAGNOSIS — M25473 Effusion, unspecified ankle: Secondary | ICD-10-CM

## 2016-09-22 DIAGNOSIS — F1120 Opioid dependence, uncomplicated: Secondary | ICD-10-CM

## 2016-09-22 DIAGNOSIS — S22080A Wedge compression fracture of T11-T12 vertebra, initial encounter for closed fracture: Secondary | ICD-10-CM

## 2016-09-22 DIAGNOSIS — M40204 Unspecified kyphosis, thoracic region: Secondary | ICD-10-CM | POA: Diagnosis not present

## 2016-09-22 MED ORDER — HYDROCODONE-ACETAMINOPHEN 5-325 MG PO TABS: 0.5000 | ORAL_TABLET | Freq: Two times a day (BID) | ORAL | 0 refills | Status: DC | PRN

## 2016-09-22 NOTE — Progress Notes (Signed)
   CC: leg pain  HPI:  [name] is a 81 year old woman with osteoporosis, GERD, hx compression fracture presenting for follow-up of chronic pain.  She is also taking excedrin and taking hydrocodone four times a day. She is sleepy with taking the hydrocodone during the day.  She has bilateral intermittent ankle swelling that has gotten better.   Past Medical History:  Diagnosis Date  . Allergy   . Chronic low back pain   . Compression fracture    T9 and T12 noted 05/2012 imaging. Followed by pain clinic   . Fall    Jan or Feb 2014   . Gastritis   . GERD (gastroesophageal reflux disease)   . Insomnia   . Irregular heart rate   . Osteoporosis   . Peptic ulcer disease   . Rib pain   . Right wrist fracture    3 - 4 monthes ago    Review of Systems:   No fevers or chills No dyspnea  Physical Exam:  Vitals:   09/22/16 1535  BP: (!) 157/71  Pulse: 72  Temp: 98 F (36.7 C)  TempSrc: Oral  SpO2: 100%  Weight: 88 lb (39.9 kg)   General Apperance: NAD, elderly woman with kyphosis HEENT: Normocephalic, atraumatic, anicteric sclera Neck: Supple, trachea midline Lungs: Clear to auscultation bilaterally. No wheezes, rhonchi or rales. Breathing comfortably Heart: Regular rate and rhythm, no murmur/rub/gallop Abdomen: Soft, nontender, nondistended, no rebound/guarding Extremities: Warm and well perfused, no edema, 2+ DP pulses bilaterally Skin: No rashes or lesions Neurologic: Alert and interactive. No gross deficits.  Assessment & Plan:   See Encounters Tab for problem based charting.  Patient discussed with Dr. Cyndie ChimeGranfortuna

## 2016-09-22 NOTE — Patient Instructions (Addendum)
We will refer you to Physical Medicine (PM&R) Try cutting the hydrocodone pill in half if it is making you too sleepy Follow up in 3 months

## 2016-09-23 DIAGNOSIS — M25473 Effusion, unspecified ankle: Secondary | ICD-10-CM | POA: Insufficient documentation

## 2016-09-23 DIAGNOSIS — R030 Elevated blood-pressure reading, without diagnosis of hypertension: Secondary | ICD-10-CM | POA: Insufficient documentation

## 2016-09-23 NOTE — Assessment & Plan Note (Signed)
Daughter mentioned bilateral ankle and foot swelling with redness. None seen on exam today. Pulses 2+ DP bilaterally. Possibly dependent edema/vascular insufficiency. Recommended elevation of legs, exercise, consider compression.

## 2016-09-23 NOTE — Assessment & Plan Note (Signed)
Assessment: Hx osteoporosis. Previously had thoracic vertebral compression fractures in 2014. She is kyphotic. Has chronic pain from her back. We have struggled with finding a good regimen for her pain and she has alternated between tramadol and Norco 5-325. Currently the Norco makes her sleepy during the day and the family reports she takes up to 4 tablets a day though on review of my prescriptions for her, she does not get more than #60 per month and does not request early refill. She had previously seen sports medicine where they discussed surgical options, and they were not interested in this. The family is interested in what PM&R can offer in terms of injections or other modalities of pain control.   Plan: Referral placed for PM&R

## 2016-09-23 NOTE — Assessment & Plan Note (Addendum)
Allison Rojas is on chronic opioid therapy for chronic pain.  As part of the treatment plan, the [place] controlled substance database is checked at least twice yearly and the database results are appropriate. I have last reviewed the results on 09/23/2016.   The last UDS was on 09/22/2016 and results pending. Patient needs at least a yearly UDS.   The patient is on hydrocodone/acetaminophen (Lorcet, Lortab, Norco, Vicodin) strength 5/325mg , 60 per 30 days. Adjunctive treatment includes NSAID's and Muscle relaxants. This regimen allows Allison Rojas to function and does not cause excessive sedation or other side effects.  "The benefits of continuing opioid therapy outweigh the risks and chronic opioids will be continued. Ongoing education about safe opioid treatment is provided  Interventions today include: UDS, Refills - 3 paper Rx printed and Referral to PM&R  Please update controlled substances contract at follow up Recheck BMP at followup given her NSAID use. Could not obtain today due to patient leaving without having BMP done.  ADDENDUM: UDS shows tramadol which is inappropriate. The last time she was dispensed tramadol was 09/17/2015. I had discussed with them at the last visit that she was to be on one medication not both. I believe we are undertreating her pain but Norco is sedating for her.   She will have a new PCP assigned to her soon and I do not have appointment slots available to discuss the UDS results with her and her daughter. Recommend for the new PCP to figure out what her regimen will be especially after seeing the pain specialist. If she continues to require prescriptions for pain medication from our clinic, I would recommend addressing the inappropriate UDS results and the risks of self treating outside of our recommendations and monitoring.

## 2016-09-23 NOTE — Assessment & Plan Note (Addendum)
Patient and daughter are interested in changing to IV therapy. She frequently does not take the medication. May consider Zoledronic acid infusions. Could also change to a different class of medications to see if bisphosphonates are contributing to her chronic pain with the side effect of bone pain. Will ask Dr. Selena BattenKim for help with seeing what will be covered for them.  ADDENDUM: Will transition patient to denosumab. She has had the oral bisphosponate therapy for several years and would be a good candidate for this given she is an older patient who has difficulty with the dosing requirements of oral bisphosphonates. Will coordinate with pharmacy as she will be transitioning to a new PCP within our clinic soon and will likely need a pretreatment evaluation given the risks of therapy.

## 2016-09-23 NOTE — Progress Notes (Signed)
Medicine attending: Medical history, presenting problems, physical findings, and medications, reviewed with resident physician Dr Jennifer Krall on the day of the patient visit and I concur with her evaluation and management plan. 

## 2016-09-23 NOTE — Assessment & Plan Note (Signed)
Elevated BP 157/71. Likely due to pain. Follow up in 3 months and recheck BP at that time.

## 2016-09-26 ENCOUNTER — Other Ambulatory Visit: Payer: Self-pay | Admitting: Pharmacist

## 2016-09-26 DIAGNOSIS — S22080A Wedge compression fracture of T11-T12 vertebra, initial encounter for closed fracture: Secondary | ICD-10-CM

## 2016-09-26 MED ORDER — TERIPARATIDE (RECOMBINANT) 600 MCG/2.4ML ~~LOC~~ SOLN: 20.0000 ug | Freq: Every day | SUBCUTANEOUS | 0 refills | Status: DC

## 2016-09-26 MED ORDER — DENOSUMAB 60 MG/ML ~~LOC~~ SOLN: 60.0000 mg | SUBCUTANEOUS | 0 refills | Status: DC

## 2016-09-26 NOTE — Progress Notes (Signed)
Price check on osteoporosis therapy options (Medicare copays, none are preferred under Medicaid): Prolia $3.70 Reclast $260 Forteo not covered

## 2016-09-28 ENCOUNTER — Other Ambulatory Visit: Payer: Self-pay | Admitting: Pharmacist

## 2016-09-28 DIAGNOSIS — M810 Age-related osteoporosis without current pathological fracture: Secondary | ICD-10-CM

## 2016-09-28 LAB — TOXASSURE SELECT,+ANTIDEPR,UR

## 2016-09-28 NOTE — Progress Notes (Signed)
BMP and vitamin D on 10/06/16 per Dr. Isabella BowensKrall

## 2016-09-30 ENCOUNTER — Encounter: Payer: Self-pay | Admitting: *Deleted

## 2016-10-03 ENCOUNTER — Encounter: Payer: Self-pay | Admitting: *Deleted

## 2016-10-06 ENCOUNTER — Ambulatory Visit: Payer: Medicare Other | Admitting: Pharmacist

## 2016-10-06 ENCOUNTER — Other Ambulatory Visit (INDEPENDENT_AMBULATORY_CARE_PROVIDER_SITE_OTHER): Payer: Medicare Other

## 2016-10-06 DIAGNOSIS — M810 Age-related osteoporosis without current pathological fracture: Secondary | ICD-10-CM

## 2016-10-06 DIAGNOSIS — Z79899 Other long term (current) drug therapy: Secondary | ICD-10-CM

## 2016-10-06 DIAGNOSIS — Z719 Counseling, unspecified: Secondary | ICD-10-CM

## 2016-10-06 NOTE — Progress Notes (Signed)
S: Allison Rojas is a 81 y.o. female reports to clinical pharmacist appointment for osteoporosis medicaCharlene Brooketion education. Patient presents with her daughter, who assists in the care of patient.   Allergies  Allergen Reactions  . Amitriptyline     Unclear allergy. Patient states made her eyelids shake   Medication Sig  acetaminophen (TYLENOL) 500 MG tablet Take 1 tablet (500 mg total) by mouth 3 (three) times daily.  alendronate (FOSAMAX) 35 MG tablet TAKE 1 TABLET (35 MG TOTAL) BY MOUTH EVERY 7 (SEVEN) DAYS. TAKE WITH AFULL GLASS OF WATER ON AN EMPTY STOMACH. TAKES ON WED  Ascorbic Acid (VITAMIN C) 100 MG CHEW Chew 1 tablet (100 mg total) by mouth daily.  Cholecalciferol (VITAMIN D PO) Take 1 tablet by mouth daily.  cyclobenzaprine (FLEXERIL) 10 MG tablet TAKE 1 TABLET (10 MG TOTAL) BY MOUTH 2 (TWO) TIMES DAILY AS NEEDED FOR MUSCLE SPASMS (LEG PAIN).  diclofenac sodium (VOLTAREN) 1 % GEL Apply 4 g topically 4 (four) times daily.  fluticasone (FLONASE) 50 MCG/ACT nasal spray Place 2 sprays into both nostrils daily as needed for rhinitis. For allergies  HYDROcodone-acetaminophen (NORCO/VICODIN) 5-325 MG tablet Take 0.5-1 tablets by mouth every 12 (twelve) hours as needed for severe pain.  ranitidine (ZANTAC) 150 MG tablet TAKE ONE TABLET BY MOUTH TWICE A DAY AS NEEDED FOR HEARTBURN   Past Medical History:  Diagnosis Date  . Allergy   . Chronic low back pain   . Compression fracture    T9 and T12 noted 05/2012 imaging. Followed by pain clinic   . Fall    Jan or Feb 2014   . Gastritis   . GERD (gastroesophageal reflux disease)   . Insomnia   . Irregular heart rate   . Osteoporosis   . Peptic ulcer disease   . Rib pain   . Right wrist fracture    3 - 4 monthes ago   Social History   Social History  . Marital status: Widowed    Spouse name: N/A  . Number of children: N/A  . Years of education: N/A   Social History Main Topics  . Smoking status: Never Smoker  . Smokeless tobacco:  Never Used  . Alcohol use No  . Drug use: No  . Sexual activity: No   Other Topics Concern  . Not on file   Social History Narrative   Financial assistance [facility] and [facility] per Rudell CobbDeborah Hill   11/30/2009   She is widowed, she has 2 children (only admits to 1 daughter now 07/2012), she does not smoke cigarettes or drink alcohol   Lives with daughter Tula NakayamaYang [place] [phone]               Family History  Problem Relation Age of Onset  . Colon cancer Neg Hx   . Esophageal cancer Neg Hx   . Rectal cancer Neg Hx   . Stomach cancer Neg Hx    O: Component Value Date/Time   CHOL 142 07/24/2013 1021   HDL 51 07/24/2013 1021   TRIG 71 07/24/2013 1021   AST 19 01/30/2015 1718   ALT 9 01/30/2015 1718   NA 143 06/25/2015 1650   K 4.4 06/25/2015 1650   CL 104 06/25/2015 1650   CO2 20 06/25/2015 1650   GLUCOSE 88 06/25/2015 1650   GLUCOSE 74 07/24/2013 1021   BUN 24 06/25/2015 1650   CREATININE 0.97 06/25/2015 1650   CREATININE 0.98 07/24/2013 1021  CALCIUM 8.7 06/25/2015 1650   GFRAA 65 06/25/2015 1650   GFRAA 65 07/24/2013 1021   WBC 5.1 01/30/2015 1718   WBC 5.2 07/24/2013 1021   HGB 10.1 (L) 01/30/2015 1718   HCT 31.1 (L) 01/30/2015 1718   PLT 207 01/30/2015 1718   TSH 2.001 08/10/2012 2157   Ht Readings from Last 2 Encounters:  09/22/16 4\' 11"  (1.499 m)  11/12/15 4\' 11"  (1.499 m)   Wt Readings from Last 2 Encounters:  09/22/16 88 lb (39.9 kg)  11/12/15 89 lb 8 oz (40.6 kg)   There is no height or weight on file to calculate BMI. BP Readings from Last 3 Encounters:  09/22/16 (!) 157/71  11/12/15 (!) 114/58  06/25/15 132/73   A/P:  Patient with osteoporosis (right femur T-score from 01/24/2011 of -3.6) presents for information on medication options  Patient has been on alendronate for at least 5 years (02/18/2011), patient reports having trouble remembering to take it  I recommend denosumab (Prolia) or teriparatide (Forteo)  considering duration of bisphosphonate therapy and severity of osteoporosis. In the future, can consider IV bisphosphate therapy (Reclast) as an alternative.  Medication options were reviewed with patient and family and discussed with PCP, in favor of Prolia which is generally tolerable and requires less frequent administration (every 6 months rather than daily).   Patient will have a new PCP assigned and will follow up for decision on osteoporosis therapy.  An after visit summary was provided and patient advised to follow up in 1 month or sooner if any changes in condition or questions regarding medications arise.  The patient and family verbalized understanding of information provided by repeating back concepts discussed.

## 2016-10-07 LAB — VITAMIN D 25 HYDROXY (VIT D DEFICIENCY, FRACTURES): Vit D, 25-Hydroxy: 173.2 ng/mL — ABNORMAL HIGH (ref 30.0–100.0)

## 2016-10-07 LAB — BASIC METABOLIC PANEL WITH GFR
BUN/Creatinine Ratio: 27 (ref 12–28)
BUN: 30 mg/dL — ABNORMAL HIGH (ref 8–27)
CO2: 25 mmol/L (ref 20–29)
Calcium: 9.2 mg/dL (ref 8.7–10.3)
Chloride: 103 mmol/L (ref 96–106)
Creatinine, Ser: 1.12 mg/dL — ABNORMAL HIGH (ref 0.57–1.00)
GFR calc Af Amer: 54 mL/min/1.73 — ABNORMAL LOW
GFR calc non Af Amer: 47 mL/min/1.73 — ABNORMAL LOW
Glucose: 83 mg/dL (ref 65–99)
Potassium: 4.5 mmol/L (ref 3.5–5.2)
Sodium: 141 mmol/L (ref 134–144)

## 2016-10-13 ENCOUNTER — Telehealth: Payer: Self-pay | Admitting: Internal Medicine

## 2016-10-13 NOTE — Telephone Encounter (Signed)
Doctor needs to get referral for her, she needs injections per doctor, been waiting for over month.

## 2016-10-17 NOTE — Telephone Encounter (Signed)
Patient request to change hydrocodone to tramadol because the hydrocodone makes her dizzy. Please advise

## 2016-10-17 NOTE — Telephone Encounter (Signed)
Patient will need to be seen. I am happy to change her to tramadol, but she just received 3 monthly prescriptions for Hydrocodone in June. She will need to bring her paper prescriptions in to be changed out, and we will need to change her pain contract to reflect the new medicine. Thanks!

## 2016-10-20 ENCOUNTER — Telehealth: Payer: Self-pay | Admitting: Internal Medicine

## 2016-10-20 NOTE — Telephone Encounter (Signed)
Rtc, no answer, lm for rtc 

## 2016-10-20 NOTE — Telephone Encounter (Signed)
PATIENT WANTS TO CHANGE PAIN MEDICATION, CURRENT ONE MAKES HER DIZZY, [facility] [address]

## 2016-10-21 NOTE — Telephone Encounter (Signed)
Tried to call pt 7/5 and 7/6 no answer

## 2016-10-21 NOTE — Telephone Encounter (Signed)
Call from pt's dtr stating she has left multiple messages regarding pt's norco.  Dtr would like pt to d/c the norco and pt back on tramadol.  Pt gets "dizzy" when she takes the norco during the day.  The dtr still has the other norco prescriptions in hand and could bring them back and get new rx for tramadol.  Next avail appt w pcp will be in July 23rd. Will send info to pcp for review.  Please advise.Kingsley SpittleGoldston, Darlene Cassady7/6/20184:55 PM

## 2016-10-24 ENCOUNTER — Telehealth: Payer: Self-pay | Admitting: *Deleted

## 2016-10-24 NOTE — Telephone Encounter (Signed)
You can have her scheduled in [facility]. I will send a message to the attendings and let them know. Thanks!

## 2016-10-24 NOTE — Telephone Encounter (Signed)
I have openings in clinic this afternoon if she can come in. Otherwise [facility] should be fine. Thanks.

## 2016-10-24 NOTE — Telephone Encounter (Signed)
Pt's daughter calls and states she wants her mother's norco changed to tramadol, she is given dr Mohawk Industriesguilloud's message and offered appt, she states she wants her to get the "shot" for osteoporosis at that time, looked back at note from dr kim and it speaks of possibly changing to infusions. She is informed all that cannot be done at 1 appt, she is not happy with that answer but it is reinforced and she is ask to speak w/ the doctor they see in [facility] and see if an appt can be arranged, she is informed again it cannot be all done in clinic and it will need to be scheduled if doctor is

## 2016-10-25 ENCOUNTER — Other Ambulatory Visit: Payer: Self-pay | Admitting: Pulmonary Disease

## 2016-10-25 ENCOUNTER — Encounter: Payer: Self-pay | Admitting: Internal Medicine

## 2016-10-25 ENCOUNTER — Telehealth: Payer: Self-pay

## 2016-10-25 ENCOUNTER — Ambulatory Visit (INDEPENDENT_AMBULATORY_CARE_PROVIDER_SITE_OTHER): Payer: Medicare Other | Admitting: Internal Medicine

## 2016-10-25 DIAGNOSIS — M79604 Pain in right leg: Secondary | ICD-10-CM | POA: Diagnosis not present

## 2016-10-25 DIAGNOSIS — Z87310 Personal history of (healed) osteoporosis fracture: Secondary | ICD-10-CM

## 2016-10-25 DIAGNOSIS — M810 Age-related osteoporosis without current pathological fracture: Secondary | ICD-10-CM | POA: Diagnosis not present

## 2016-10-25 DIAGNOSIS — M79605 Pain in left leg: Secondary | ICD-10-CM | POA: Diagnosis not present

## 2016-10-25 DIAGNOSIS — F1120 Opioid dependence, uncomplicated: Secondary | ICD-10-CM

## 2016-10-25 DIAGNOSIS — Z79891 Long term (current) use of opiate analgesic: Secondary | ICD-10-CM

## 2016-10-25 DIAGNOSIS — G8921 Chronic pain due to trauma: Secondary | ICD-10-CM

## 2016-10-25 MED ORDER — METHOCARBAMOL 500 MG PO TABS: 500.0000 mg | ORAL_TABLET | Freq: Two times a day (BID) | ORAL | 2 refills | Status: DC

## 2016-10-25 MED ORDER — TRAMADOL HCL 50 MG PO TABS: 50.0000 mg | ORAL_TABLET | Freq: Three times a day (TID) | ORAL | 0 refills | Status: DC

## 2016-10-25 NOTE — Assessment & Plan Note (Addendum)
Assessment: bilateral lower extremity muscle spasms Patient reports taking Excedrin for her muscle spasms.  She states she took flexeril for a short period of time but reports it wasn't effective after a short period.  She states that she gets muscle spasms 2-3 times a day and are located in her calf and travels down to her foot. Episodes can last up to 10 minutes. She describes the pain as excruciating. Since patient has tried Flexeril in the past with little benefit will try  Robaxin to see if this helps with muscle spasms. Patient also states she feels that she has poor circulation in her legs because she noticed that her toes were red.  She had strong pulses bilaterally but due to discoloration ABIs were obtained and showed left: 1.19 and right: 1.16.  Reassured patient she had good blood flow in her legs  Plan - told to stop taking excedrin and take robaxin up to two times a day for muscle spasms - ABIs

## 2016-10-25 NOTE — Assessment & Plan Note (Signed)
Assessment: Chronic pain management for history of compression fracture T12 vertebra  Patient has a history of thoracic vertebral compression fractions in 2014 and has chronic pain in her back. From previous notes patient has had trouble finding a regimen for pain and has altered between tramadol and Norco 5-3 25. She is currently on Norco and was given a 3 month supply on 09/22/2016 however, she presents today wanting to switch back to tramadol as Norco causes her to be too drowsy.  She states she takes her pain medication 3 times a day. Once when she gets up, at 2pm then at 11pm.  When reviewing the database she filled her last prescription on 09/27/16. Today she brought in her 2 paper prescriptions of norco and I switched it with 3 paper prescriptions of tramadol.    Plan -tramadol 3 paper precriptions good through ~01/27/17 - told to follow up with PCP

## 2016-10-25 NOTE — Assessment & Plan Note (Addendum)
Assessment: Osteoporosis Patient with osteoporosis (right femur T-score from 01/24/2011 of -3.6).  Patient has been on alendronate for at least 5 years.  Patient discussed medication options for osteoporosis with our pharmacist including denosumab (Prolia) and teriparatide (Forteo) on 6/21. Patient states she would like to set up an appointment to have the injection.  Plan -told patient set up appointment at front for osteoporosis injection follow up

## 2016-10-25 NOTE — Patient Instructions (Addendum)
Ms. Allison Rojas,  It was a pleasure meeting you today Please take tramadol as needed for your back pain Please stop taking Excedrin and start taking Robaxin twice a day for your muscle spasms Please make an appointment at the front to have your osteoporosis shot scheduled

## 2016-10-25 NOTE — Telephone Encounter (Signed)
Per pharmacy Robaxin is on back order will not bee in until later this month patient requesting something else to be sent in.

## 2016-10-25 NOTE — Progress Notes (Signed)
   CC: Follow up on chronic pain management  HPI:  [name] is a 81 y.o. woman with osteoporosis, GERD, hx compression fracture presenting for follow-up of chronic pain. Please see problem based charting for the status of patient's chronic medical conditions   Past Medical History:  Diagnosis Date  . Allergy   . Chronic low back pain   . Compression fracture    T9 and T12 noted 05/2012 imaging. Followed by pain clinic   . Fall    Jan or Feb 2014   . Gastritis   . GERD (gastroesophageal reflux disease)   . Insomnia   . Irregular heart rate   . Osteoporosis   . Peptic ulcer disease   . Rib pain   . Right wrist fracture    3 - 4 monthes ago    Review of Systems:  Review of Systems  Cardiovascular: Negative for leg swelling.  Musculoskeletal: Positive for back pain.       Muscle spasms   Neurological: Negative for weakness.     Physical Exam:  Vitals:   10/25/16 0837  BP: 139/66  Pulse: 75  Temp: 97.9 F (36.6 C)  TempSrc: Oral  SpO2: 100%  Weight: 89 lb (40.4 kg)  Height: 4\' 11"  (1.499 m)   Physical Exam  Constitutional: She is well-developed, well-nourished, and in no distress.  Cardiovascular: Normal rate, regular rhythm and normal heart sounds.  Exam reveals no gallop and no friction rub.   No murmur heard. Pulmonary/Chest: Breath sounds normal. No respiratory distress. She has no wheezes. She has no rales.  Musculoskeletal: She exhibits no edema.  Neurological:  5/5 motor strength bilaterally in lower extremities bilaterally   Skin:  2+ pedal pulses bilaterally Coolness to the metatarsals bilaterally with associated erythema     Assessment & Plan:   See encounters tab for problem based medical decision making.    Patient discussed with Dr. Criselda PeachesMullen

## 2016-10-26 NOTE — Progress Notes (Signed)
Internal Medicine Clinic Attending  Case discussed with Dr. Hoffman at the time of the visit.  We reviewed the resident's history and exam and pertinent patient test results.  I agree with the assessment, diagnosis, and plan of care documented in the resident's note.  

## 2016-10-28 ENCOUNTER — Other Ambulatory Visit: Payer: Self-pay | Admitting: Internal Medicine

## 2016-10-28 MED ORDER — DENOSUMAB 60 MG/ML ~~LOC~~ SOLN: 60.0000 mg | Freq: Once | SUBCUTANEOUS | 0 refills | Status: AC

## 2016-11-01 NOTE — Telephone Encounter (Signed)
I have not yet met this patient. Please forward request to [facility] physician who wrote the prescription. Thanks!

## 2016-11-02 ENCOUNTER — Other Ambulatory Visit: Payer: Self-pay | Admitting: Internal Medicine

## 2016-11-02 MED ORDER — CYCLOBENZAPRINE HCL 5 MG PO TABS: 5.0000 mg | ORAL_TABLET | Freq: Two times a day (BID) | ORAL | 0 refills | Status: DC | PRN

## 2016-11-02 NOTE — Telephone Encounter (Signed)
Sent a short course of flexeril to the pharmacy.

## 2016-11-09 ENCOUNTER — Ambulatory Visit: Payer: Medicare Other | Admitting: Pharmacist

## 2016-11-09 MED ORDER — DENOSUMAB 60 MG/ML ~~LOC~~ SOLN: 60.0000 mg | Freq: Once | SUBCUTANEOUS | 0 refills | Status: AC

## 2016-11-09 MED FILL — PROLIA 60 MG/ML SOLN: 60 | 90 days supply | Qty: 1 | Fill #0

## 2016-11-10 ENCOUNTER — Telehealth: Payer: Self-pay | Admitting: *Deleted

## 2016-11-10 NOTE — Telephone Encounter (Signed)
Dr Selena Battenkim will give injection for osteoporosis 7/27, pt is to arrive at 1600 2  Tramadol scripts were returned 7/25, dr Mikey Bussinghoffman was made aware and the scripts were placed in pt's narcotic folder

## 2016-11-11 ENCOUNTER — Ambulatory Visit (INDEPENDENT_AMBULATORY_CARE_PROVIDER_SITE_OTHER): Payer: Medicare Other | Admitting: Pharmacist

## 2016-11-11 ENCOUNTER — Encounter: Payer: Self-pay | Admitting: Pharmacist

## 2016-11-11 DIAGNOSIS — Z719 Counseling, unspecified: Secondary | ICD-10-CM | POA: Diagnosis not present

## 2016-11-11 DIAGNOSIS — M810 Age-related osteoporosis without current pathological fracture: Secondary | ICD-10-CM | POA: Diagnosis present

## 2016-11-11 DIAGNOSIS — Z79899 Other long term (current) drug therapy: Secondary | ICD-10-CM | POA: Diagnosis not present

## 2016-11-11 NOTE — Progress Notes (Addendum)
S: Allison Rojas is a 81 y.o. female reports to clinical pharmacist appointment for osteoporosis medication management. Patient is accompanied by daughter, who assists at home with medication management.  Allergies  Allergen Reactions  . Amitriptyline     Unclear allergy. Patient states made her eyelids shake   Medication Sig  acetaminophen (TYLENOL) 500 MG tablet Take 1 tablet (500 mg total) by mouth 3 (three) times daily.  Ascorbic Acid (VITAMIN C) 100 MG CHEW Chew 1 tablet (100 mg total) by mouth daily.  Cholecalciferol (VITAMIN D PO) Take 1 tablet by mouth daily.  cyclobenzaprine (FLEXERIL) 5 MG tablet Take 1 tablet (5 mg total) by mouth 2 (two) times daily as needed for muscle spasms.  diclofenac sodium (VOLTAREN) 1 % GEL Apply 4 g topically 4 (four) times daily.  fluticasone (FLONASE) 50 MCG/ACT nasal spray Place 2 sprays into both nostrils daily as needed for rhinitis. For allergies  HYDROcodone-acetaminophen (NORCO/VICODIN) 5-325 MG tablet Take 0.5-1 tablets by mouth every 12 (twelve) hours as needed for severe pain.  methocarbamol (ROBAXIN) 500 MG tablet Take 1 tablet (500 mg total) by mouth 2 (two) times daily.  ranitidine (ZANTAC) 150 MG tablet TAKE ONE TABLET BY MOUTH TWICE A DAY AS NEEDED FOR HEARTBURN  traMADol (ULTRAM) 50 MG tablet Take 1 tablet (50 mg total) by mouth 3 (three) times daily.   Past Medical History:  Diagnosis Date  . Allergy   . Chronic low back pain   . Compression fracture    T9 and T12 noted 05/2012 imaging. Followed by pain clinic   . Fall    Jan or Feb 2014   . Gastritis   . GERD (gastroesophageal reflux disease)   . Insomnia   . Irregular heart rate   . Osteoporosis   . Peptic ulcer disease   . Rib pain   . Right wrist fracture    3 - 4 monthes ago   Social History   Social History  . Marital status: Widowed    Spouse name: N/A  . Number of children: N/A  . Years of education: N/A   Social History Main Topics  . Smoking status: Never  Smoker  . Smokeless tobacco: Never Used  . Alcohol use No  . Drug use: No  . Sexual activity: No   Other Topics Concern  . Not on file   Social History Narrative   [facility] assistance [facility] and [facility] Rudell CobbDeborah Hill   11/30/2009   She is widowed, she has 2 children (only admits to 1 daughter now 07/2012), she does not smoke cigarettes or drink alcohol   Lives with daughter Tula NakayamaYang Higginbotham [phone]               Family History  Problem Relation Age of Onset  . Colon cancer Neg Hx   . Esophageal cancer Neg Hx   . Rectal cancer Neg Hx   . Stomach cancer Neg Hx    O: Component Value Date/Time   CHOL 142 07/24/2013 1021   HDL 51 07/24/2013 1021   TRIG 71 07/24/2013 1021   AST 19 01/30/2015 1718   ALT 9 01/30/2015 1718   NA 141 10/06/2016 1607   K 4.5 10/06/2016 1607   CL 103 10/06/2016 1607   CO2 25 10/06/2016 1607   GLUCOSE 83 10/06/2016 1607   GLUCOSE 74 07/24/2013 1021   BUN 30 (H) 10/06/2016 1607   CREATININE 1.12 (H) 10/06/2016 1607  CREATININE 0.98 07/24/2013 1021   CALCIUM 9.2 10/06/2016 1607   GFRNONAA 47 (L) 10/06/2016 1607   GFRNONAA 57 (L) 07/24/2013 1021   WBC 5.1 01/30/2015 1718   WBC 5.2 07/24/2013 1021   HGB 10.1 (L) 01/30/2015 1718   HCT 31.1 (L) 01/30/2015 1718   PLT 207 01/30/2015 1718   TSH 2.001 08/10/2012 2157   Ht Readings from Last 2 Encounters:  10/25/16 4\' 11"  (1.499 m)  09/22/16 4\' 11"  (1.499 m)   Wt Readings from Last 2 Encounters:  10/25/16 89 lb (40.4 kg)  09/22/16 88 lb (39.9 kg)   There is no height or weight on file to calculate BMI. BP Readings from Last 3 Encounters:  10/25/16 139/66  09/22/16 (!) 157/71  11/12/15 (!) 114/58    A/P:  Patient with osteoporosis (right femur T-score from 01/24/2011 of -3.6) presents for information on medication options  Patient has been on alendronate for at least 5 years (02/18/2011), patient reports having trouble remembering to take it  Initiated  denosumab (Prolia) 60 mg daily as approved by PCP and attending physician. Recent labs indicate calcium WNL. Prolia was reviewed with the patient and daughter, including name, instructions, indication, goals of therapy, potential side effects, importance of adherence, and safe use.  Patient took first dose today and reports no side effects of concern following administration. Advised patient to follow up with clinic anytime if concerns arise. Next dose due in 6 months.  An after visit summary was provided and patient [facility]. The patient and family verbalized understanding of information provided by repeating back concepts discussed.  30 minutes spent with patient and family, 80% on education, 20% on coordination of care.

## 2016-11-14 ENCOUNTER — Other Ambulatory Visit: Payer: Self-pay | Admitting: Internal Medicine

## 2016-11-14 MED ORDER — BACLOFEN 5 MG PO TABS: 5.0000 mg | ORAL_TABLET | Freq: Two times a day (BID) | ORAL | 0 refills | Status: DC

## 2016-11-24 NOTE — Progress Notes (Signed)
Prolia was reviewed with the patient, including name, instructions, indication, goals of therapy, potential side effects, importance of adherence, and safe use.  Patient verbalized understanding by repeating back information and was advised to contact me if further medication-related questions arise. Patient was also provided an information handout.

## 2016-11-28 ENCOUNTER — Ambulatory Visit (INDEPENDENT_AMBULATORY_CARE_PROVIDER_SITE_OTHER): Payer: Medicare Other | Admitting: Internal Medicine

## 2016-11-28 ENCOUNTER — Encounter: Payer: Self-pay | Admitting: Internal Medicine

## 2016-11-28 VITALS — BP 119/62 | HR 76 | Temp 98.0°F | Ht 59.0 in | Wt 84.7 lb

## 2016-11-28 DIAGNOSIS — M79605 Pain in left leg: Secondary | ICD-10-CM

## 2016-11-28 DIAGNOSIS — G8929 Other chronic pain: Secondary | ICD-10-CM | POA: Diagnosis present

## 2016-11-28 DIAGNOSIS — Z681 Body mass index (BMI) 19 or less, adult: Secondary | ICD-10-CM

## 2016-11-28 DIAGNOSIS — F1190 Opioid use, unspecified, uncomplicated: Secondary | ICD-10-CM

## 2016-11-28 DIAGNOSIS — D649 Anemia, unspecified: Secondary | ICD-10-CM

## 2016-11-28 DIAGNOSIS — E559 Vitamin D deficiency, unspecified: Secondary | ICD-10-CM

## 2016-11-28 DIAGNOSIS — R030 Elevated blood-pressure reading, without diagnosis of hypertension: Secondary | ICD-10-CM

## 2016-11-28 DIAGNOSIS — Z79891 Long term (current) use of opiate analgesic: Secondary | ICD-10-CM | POA: Diagnosis not present

## 2016-11-28 DIAGNOSIS — M79604 Pain in right leg: Secondary | ICD-10-CM | POA: Diagnosis not present

## 2016-11-28 DIAGNOSIS — M546 Pain in thoracic spine: Secondary | ICD-10-CM

## 2016-11-28 DIAGNOSIS — R636 Underweight: Secondary | ICD-10-CM | POA: Diagnosis not present

## 2016-11-28 NOTE — Progress Notes (Signed)
   CC: Leg cramps  HPI:  [name] is a 81 y.o.vietnamese speaking female with past medical history outlined below here for follow up of her bilateral leg cramps / pain. History was obtained with the help of a translator. For the details of today's visit, please refer to the assessment and plan.  Past Medical History:  Diagnosis Date  . Allergy   . Chronic low back pain   . Compression fracture    T9 and T12 noted 05/2012 imaging. Followed by pain clinic   . Fall    Jan or Feb 2014   . Gastritis   . GERD (gastroesophageal reflux disease)   . Insomnia   . Irregular heart rate   . Osteoporosis   . Peptic ulcer disease   . Rib pain   . Right wrist fracture    3 - 4 monthes ago    Review of Systems:  Denies falls, and issues with ambulation / balance.    Physical Exam:  Vitals:   11/28/16 1445  BP: 119/62  Pulse: 76  Temp: 98 F (36.7 C)  TempSrc: Oral  SpO2: 98%  Weight: 84 lb 11.2 oz (38.4 kg)  Height: 4\' 11"  (1.499 m)    Constitutional: Thin underweight elderly woman in NAD  Cardiovascular: RRR Pulmonary/Chest: CTAB Extremities: Warm and well perfused. No edema.  Skin: No rashes or erythema  Psychiatric: Normal mood and affect  Assessment & Plan:   See Encounters Tab for problem based charting.  Patient discussed with Dr. Cleda DaubE. Hoffman

## 2016-11-28 NOTE — Patient Instructions (Signed)
Ms. Nedra HaiLee,  It was a pleasure seeing you today. Please continue to take all of your medications as previously prescribed. Please follow up with me in 1-2 weeks to go over your blood work. At that time we will have you sign paperwork for your pain medication. If you have any questions or concerns, call our clinic at [phone] or after hours call [phone] and ask for the internal medicine resident on call. Thank you!  - Dr. Antony ContrasGuilloud

## 2016-11-29 LAB — CBC
HEMATOCRIT: 31.1 % — AB (ref 34.0–46.6)
Hemoglobin: 10.1 g/dL — ABNORMAL LOW (ref 11.1–15.9)
MCH: 30.3 pg (ref 26.6–33.0)
MCHC: 32.5 g/dL (ref 31.5–35.7)
MCV: 93 fL (ref 79–97)
Platelets: 224 10*3/uL (ref 150–379)
RBC: 3.33 x10E6/uL — ABNORMAL LOW (ref 3.77–5.28)
RDW: 14.1 % (ref 12.3–15.4)
WBC: 4.1 10*3/uL (ref 3.4–10.8)

## 2016-11-29 LAB — FERRITIN: FERRITIN: 24 ng/mL (ref 15–150)

## 2016-11-30 ENCOUNTER — Telehealth: Payer: Self-pay | Admitting: Internal Medicine

## 2016-11-30 MED ORDER — FERROUS SULFATE 325 (65 FE) MG PO TABS: 325.0000 mg | ORAL_TABLET | Freq: Two times a day (BID) | ORAL | 3 refills | Status: DC

## 2016-11-30 NOTE — Assessment & Plan Note (Addendum)
Patient is here for follow up of her chronic bilateral leg pain. History was obtained via her daughter and with the help of a Nurse, learning disabilitytranslator. Daughter reports patient continues to have cramping in her bilateral lower extremities with paresthesias that start in her toes and radiate up her legs to her knees. Previous work-up is notable for a vitamin D deficiency for which she is taking supplements. Vitamin B12 was normal. ABIs on 10/25/16 showed good perfusion (left 1.19, right 1.16). She was last seen one month ago and prescribed roboxin, which has only partially helped. She continues to have severe pain according to the daughter and is very concerned with identifying an etiology. Daughter requested ultrasound of her bilateral legs. I informed her that this was unlikely to yield much benefit diagnostically. Daughter reported concern that her symptoms could be secondary to varicose veins.  While she does have some superficial varicose veins on exam, she has no edema and skin appears normal without dermatitis. I reassured daughter that vericose veins were unlikely to be causing her symptoms. We discussed checking CBC and ferritin level today. Daughter was initially resistant as we have been "checking too many labs" and haven't found an answer so far, but eventually agreed. -- F/u CBC, ferritin  -- Roboxin as needed  -- Continue vitamin D supplements   UPDATE: CBC with normocytic anemia (HGB 10, MCV 93), Ferritin low normal at 24. Per guidelines, for patient suffering from restless leg syndrome, ferritin goal should be > 75 with iron saturation > 20%. Called daughter and left voicemail with results. Will plan to start ferrous sulfate tabs 325 BID with meals and repeat iron studies in 3 months. [facility]. Instructed daughter to call back with any questions and to schedule a follow up appointment.  UPDATE: Spoke with daughter regarding lab results, all questions answered. [facility]  [facility] supplements [facility] to [facility].

## 2016-11-30 NOTE — Assessment & Plan Note (Signed)
CBC and ferritin checked today for follow up of anemia and due to ongoing bilateral leg cramping despite vitamin D supplements and prn robaxin. CBC today with normocytic anemia at baseline (hgb 10, MCV 93). Ferritin low normal at 24.  -- Start ferrous sulfate tabs 325 mg BID with meals -- Repeat iron studies 3 months

## 2016-11-30 NOTE — Telephone Encounter (Signed)
Patient returning phone call about her lab results.

## 2016-11-30 NOTE — Assessment & Plan Note (Addendum)
BP elevated at prior visit, likely due to uncontrolled pain, but improved today 119/62.  -- Continue to monitor

## 2016-11-30 NOTE — Assessment & Plan Note (Signed)
Patient has a history of thoracic vertebral compression fracture in 2014 now with chronic back pain requiring opioid therapy. Patient has had difficulty establishing an effective pain regimen due to side effects. She was previously uncontrolled on tramadol and therapy was escalated to hydrocodone. Unfortunately she developed side effects of dizziness and sedation. She was changed back to tramadol on 10/25/16 (provided 3 months supply). Today we discussed establishing a pain contract for ongoing opioid therapy and patient is agreeable. Will plan to sign pain contract at next visit and obtain UDS.  -- Continue Tramadol 50 mg TID prn  -- Follow up 2 months for pain contract and UDS

## 2016-12-01 MED ORDER — FERROUS SULFATE 325 (65 FE) MG PO TABS: 325.0000 mg | ORAL_TABLET | Freq: Two times a day (BID) | ORAL | 3 refills | Status: DC

## 2016-12-01 NOTE — Addendum Note (Signed)
Addended by: Burnell BlanksGUILLOUD, Moua Rasmusson R on: 12/01/2016 08:23 PM   Modules accepted: Orders

## 2016-12-01 NOTE — Progress Notes (Signed)
Internal Medicine Clinic Attending  Case discussed with Dr. Guilloud at the time of the visit.  We reviewed the resident's history and exam and pertinent patient test results.  I agree with the assessment, diagnosis, and plan of care documented in the resident's note.  

## 2016-12-18 ENCOUNTER — Other Ambulatory Visit: Payer: Self-pay | Admitting: Internal Medicine

## 2016-12-30 ENCOUNTER — Other Ambulatory Visit: Payer: Self-pay | Admitting: Internal Medicine

## 2017-01-19 ENCOUNTER — Telehealth: Payer: Self-pay

## 2017-01-19 ENCOUNTER — Other Ambulatory Visit: Payer: Self-pay | Admitting: *Deleted

## 2017-01-19 MED ORDER — TRAMADOL HCL 50 MG PO TABS: 50.0000 mg | ORAL_TABLET | Freq: Three times a day (TID) | ORAL | 0 refills | Status: DC

## 2017-01-19 NOTE — Telephone Encounter (Signed)
Approved 1 month supply of tramadol. Patient has appointment scheduled 03/13/17 and needs UDS and pain contract.

## 2017-01-19 NOTE — Telephone Encounter (Signed)
traMADol (ULTRAM) 50 MG tablet   Refill request @ [facility] on [address].

## 2017-01-19 NOTE — Telephone Encounter (Signed)
Tramadol called in to [facility] pharmacy

## 2017-01-23 ENCOUNTER — Encounter: Payer: Medicare Other | Admitting: Internal Medicine

## 2017-01-23 ENCOUNTER — Other Ambulatory Visit: Payer: Self-pay | Admitting: Internal Medicine

## 2017-01-24 NOTE — Telephone Encounter (Signed)
Call from pt's family member requesting tramadol refill.   Per chart, tramadol refill was authorized on 10/04 #90 with no refills.  Pt's family made aware that rx was at pharmacy.  I attempted to contact pharmacy to confirm rx, but they are currently closed.  Will call back once opened.Kingsley Spittle Cassady10/9/20188:46 AM

## 2017-01-25 ENCOUNTER — Other Ambulatory Visit: Payer: Self-pay | Admitting: Internal Medicine

## 2017-01-25 NOTE — Telephone Encounter (Signed)
Tramadol called in again (this time to pharmacist Toni Amend) to [facility].  Left msg on patient's voicemail.

## 2017-01-25 NOTE — Telephone Encounter (Signed)
Confirmed to pt's daughter

## 2017-01-29 DIAGNOSIS — Z23 Encounter for immunization: Secondary | ICD-10-CM | POA: Diagnosis not present

## 2017-02-22 ENCOUNTER — Other Ambulatory Visit: Payer: Self-pay | Admitting: Internal Medicine

## 2017-02-24 NOTE — Telephone Encounter (Signed)
Have tried to call in 2x busy both calls

## 2017-02-24 NOTE — Telephone Encounter (Signed)
Approved one month supply of tramadol to last until appointment on 03/13/17. Needs UDS and pain contract at follow up.

## 2017-02-27 ENCOUNTER — Other Ambulatory Visit: Payer: Self-pay

## 2017-02-27 NOTE — Telephone Encounter (Signed)
Called pt to check which pharm wanted, lm for rtc

## 2017-02-27 NOTE — Telephone Encounter (Signed)
[facility])

## 2017-02-27 NOTE — Telephone Encounter (Signed)
traMADol (ULTRAM) 50 MG tablet   Refill request.  

## 2017-03-01 NOTE — Telephone Encounter (Signed)
Lm for rtc 

## 2017-03-08 NOTE — Telephone Encounter (Signed)
Lm for rtc 

## 2017-03-13 ENCOUNTER — Encounter: Payer: Medicare Other | Admitting: Internal Medicine

## 2017-03-24 ENCOUNTER — Other Ambulatory Visit: Payer: Self-pay | Admitting: Internal Medicine

## 2017-03-24 NOTE — Telephone Encounter (Signed)
Patient requesting a refill for tramadol.

## 2017-03-26 MED ORDER — TRAMADOL HCL 50 MG PO TABS: 50.0000 mg | ORAL_TABLET | Freq: Three times a day (TID) | ORAL | 0 refills | Status: DC

## 2017-03-27 ENCOUNTER — Encounter: Payer: Medicare Other | Admitting: Internal Medicine

## 2017-03-29 ENCOUNTER — Telehealth: Payer: Self-pay | Admitting: Internal Medicine

## 2017-03-29 NOTE — Telephone Encounter (Signed)
Refill Request ° ° °traMADol (ULTRAM) 50 MG tablet °

## 2017-03-31 NOTE — Telephone Encounter (Signed)
done

## 2017-04-03 ENCOUNTER — Other Ambulatory Visit: Payer: Self-pay | Admitting: Internal Medicine

## 2017-04-03 ENCOUNTER — Telehealth: Payer: Self-pay | Admitting: Internal Medicine

## 2017-04-03 NOTE — Telephone Encounter (Signed)
Patient checking on mom refills, out of medicine

## 2017-04-03 NOTE — Telephone Encounter (Signed)
Called to pharm 

## 2017-04-03 NOTE — Telephone Encounter (Signed)
Called daughter back, h-t pharm, script called in, updated pharm profile

## 2017-04-04 NOTE — Telephone Encounter (Signed)
I thought I already refilled this. Did the prescription not go through?

## 2017-05-04 ENCOUNTER — Other Ambulatory Visit: Payer: Self-pay

## 2017-05-04 NOTE — Telephone Encounter (Signed)
traMADol (ULTRAM) 50 MG tablet   ranitidine (ZANTAC) 150 MG tablet, [facility] @ [facility] [address]

## 2017-05-05 MED ORDER — RANITIDINE HCL 150 MG PO TABS: 150.0000 mg | ORAL_TABLET | Freq: Two times a day (BID) | ORAL | 0 refills | Status: DC

## 2017-05-05 MED ORDER — TRAMADOL HCL 50 MG PO TABS: 50.0000 mg | ORAL_TABLET | Freq: Three times a day (TID) | ORAL | 0 refills | Status: DC

## 2017-05-05 NOTE — Telephone Encounter (Signed)
Tram[facility]ol [facility]oned [facility] Received rx for zantac- confirmed.

## 2017-05-05 NOTE — Telephone Encounter (Addendum)
Approved 1 month refill of tramadol and zantac. She has not been seen since August of 2018. Appointment scheduled for February. If patient cancels or no shows her next appointment, I will not approve any further refills until she is seen in clinic. Please make sure the prescriptions went [facility] to [facility] - I did not get the approval notification on my phone for the tramadol. Thanks.

## 2017-05-19 ENCOUNTER — Ambulatory Visit: Payer: Medicare Other | Admitting: Pharmacist

## 2017-05-23 ENCOUNTER — Other Ambulatory Visit: Payer: Self-pay | Admitting: Internal Medicine

## 2017-05-23 NOTE — Telephone Encounter (Signed)
Next appt scheduled 2/28 with PCP. 

## 2017-05-29 ENCOUNTER — Other Ambulatory Visit: Payer: Self-pay | Admitting: Pharmacist

## 2017-05-29 DIAGNOSIS — M810 Age-related osteoporosis without current pathological fracture: Secondary | ICD-10-CM

## 2017-05-29 MED ORDER — PROLIA 60 MG/ML ~~LOC~~ SOLN: SUBCUTANEOUS | 0 refills | Status: DC

## 2017-05-29 MED FILL — PROLIA 60 MG/ML SOLN: 60 | 180 days supply | Qty: 1 | Fill #0

## 2017-06-05 ENCOUNTER — Ambulatory Visit (INDEPENDENT_AMBULATORY_CARE_PROVIDER_SITE_OTHER): Payer: Medicare Other | Admitting: Pharmacist

## 2017-06-05 ENCOUNTER — Other Ambulatory Visit: Payer: Self-pay | Admitting: Pharmacist

## 2017-06-05 ENCOUNTER — Ambulatory Visit (INDEPENDENT_AMBULATORY_CARE_PROVIDER_SITE_OTHER): Payer: Medicare Other | Admitting: Internal Medicine

## 2017-06-05 ENCOUNTER — Encounter: Payer: Self-pay | Admitting: Internal Medicine

## 2017-06-05 ENCOUNTER — Other Ambulatory Visit: Payer: Self-pay

## 2017-06-05 ENCOUNTER — Encounter (INDEPENDENT_AMBULATORY_CARE_PROVIDER_SITE_OTHER): Payer: Self-pay

## 2017-06-05 VITALS — BP 133/71 | HR 64 | Temp 98.4°F | Wt 83.6 lb

## 2017-06-05 DIAGNOSIS — Z79891 Long term (current) use of opiate analgesic: Secondary | ICD-10-CM | POA: Diagnosis not present

## 2017-06-05 DIAGNOSIS — D649 Anemia, unspecified: Secondary | ICD-10-CM | POA: Diagnosis not present

## 2017-06-05 DIAGNOSIS — M810 Age-related osteoporosis without current pathological fracture: Secondary | ICD-10-CM

## 2017-06-05 DIAGNOSIS — G8929 Other chronic pain: Secondary | ICD-10-CM | POA: Diagnosis not present

## 2017-06-05 DIAGNOSIS — F1190 Opioid use, unspecified, uncomplicated: Secondary | ICD-10-CM

## 2017-06-05 DIAGNOSIS — E559 Vitamin D deficiency, unspecified: Secondary | ICD-10-CM | POA: Diagnosis not present

## 2017-06-05 DIAGNOSIS — K2970 Gastritis, unspecified, without bleeding: Secondary | ICD-10-CM

## 2017-06-05 MED ORDER — TRAMADOL HCL 50 MG PO TABS: 50.0000 mg | ORAL_TABLET | Freq: Four times a day (QID) | ORAL | 2 refills | Status: DC | PRN

## 2017-06-05 MED ORDER — RANITIDINE HCL 150 MG PO TABS: 150.0000 mg | ORAL_TABLET | Freq: Two times a day (BID) | ORAL | 2 refills | Status: DC

## 2017-06-05 MED ORDER — CALCIUM CARBONATE-VITAMIN D 500-200 MG-UNIT PO TABS: 1.0000 | ORAL_TABLET | Freq: Two times a day (BID) | ORAL | 0 refills | Status: DC

## 2017-06-05 NOTE — Patient Instructions (Signed)
Patient and Patient's daughter were educated about medication as defined in this encounter and verbalized understanding by repeating back instructions provided.

## 2017-06-05 NOTE — Progress Notes (Signed)
   CC: Chronic pain, anemia follow up  HPI:  [name] is a 82 y.o. female with past medical history outlined below here for follow of her chronic pain and anemia. For the details of today's visit, please refer to the assessment and plan.  Past Medical History:  Diagnosis Date  . Allergy   . Chronic low back pain   . Compression fracture    T9 and T12 noted 05/2012 imaging. Followed by pain clinic   . Fall    Jan or Feb 2014   . Gastritis   . GERD (gastroesophageal reflux disease)   . Insomnia   . Irregular heart rate   . Osteoporosis   . Peptic ulcer disease   . Rib pain   . Right wrist fracture    3 - 4 monthes ago    Review of Systems  Musculoskeletal: Positive for back pain and joint pain. Negative for falls.    Physical Exam:  Vitals:   06/05/17 1529  BP: 133/71  Pulse: 64  Temp: 98.4 F (36.9 C)  TempSrc: Oral  SpO2: 100%  Weight: 83 lb 9.6 oz (37.9 kg)    Constitutional: NAD, appears comfortable. Thin, cachetic appearing HEENT: Atraumatic, normocephalic. PERRL, anicteric sclera. Moist mucous membranes.  Cardiovascular: RRR, no murmurs, rubs, or gallops.  Pulmonary/Chest: CTAB, no wheezes, rales, or rhonchi.  Extremities: Warm and well perfused. Distal pulses intact. No edema.  Neurological: A&Ox3, CN II - XII grossly intact. Severe thoracic kyphosis  Skin: No rashes or erythema  Psychiatric: Normal mood and affect  Assessment & Plan:   See Encounters Tab for problem based charting.  Patient discussed with Dr. Heide SparkNarendra

## 2017-06-05 NOTE — Patient Instructions (Signed)
Ms. Nedra HaiLee,  It was a pleasure seeing you today. I have increased the dose of your tramadol to every 6 hours, which is 4 times a day. Please try and avoid Excedrin with your history of stomach ulcers. I am checking blood work today and will call you with the results. If your iron is still low, I will give you a call and sent a prescription for the iron supplements to your pharmacy. Please follow up with me in 3 months to see how the increase in tramadol is working. If you have any questions or concerns, call our clinic at [phone] or after hours call [phone] and ask for the internal medicine resident on call. Thank you!  - Dr. Antony ContrasGuilloud

## 2017-06-05 NOTE — Progress Notes (Addendum)
Co-visit  Reviewed denosumab (Prolia), including indication, benefits, potential side effects.  Patient and daughter report the first dose around June 2018 was well-tolerated. No symptoms of concern. No questions or concerns today.  2nd administration observed in clinic today, no side effect of concern noted. Next dose due August 2019.  Monitoring parameters: calcium and vitamin D, labs ordered today, results in June 2018 WNL  Patient reports taking calcium + D supplement due to poor dietary intake, updated med list.  An after visit summary was provided and patient advised to follow up if any changes in condition or questions regarding medications arise.  The patient and family verbalized understanding of information provided by repeating back concepts discussed.  15 minutes spent face-to-face with the patient during the encounter. 80% of time spent on education. 20% of time was spent on coordination of care.

## 2017-06-05 NOTE — Progress Notes (Signed)
S: Allison Rojas is a 82 y.o. female reports to clinical pharmacist appointment for osteoporosis management. Patient did bring medication, denosumab (Prolia). Patient is accompanied by daughter, who assist at home with medication management.  Allergies  Allergen Reactions  . Amitriptyline     Unclear allergy. Patient states made her eyelids shake   Medication Sig  acetaminophen (TYLENOL) 500 MG tablet Take 1 tablet (500 mg total) by mouth 3 (three) times daily.  Ascorbic Acid (VITAMIN C) 100 MG CHEW Chew 1 tablet (100 mg total) by mouth daily.  baclofen (LIORESAL) 10 MG tablet   baclofen (LIORESAL) 10 MG tablet TAKE ONE TABLET BY MOUTH TWICE A DAY AS NEEDED FOR MUSCLE SPASMS  Cholecalciferol (VITAMIN D PO) Take 1 tablet by mouth daily.  diclofenac sodium (VOLTAREN) 1 % GEL Apply 4 g topically 4 (four) times daily.  ferrous sulfate 325 (65 FE) MG tablet TAKE ONE TABLET BY MOUTH TWICE A DAY WITH A MEAL  fluticasone (FLONASE) 50 MCG/ACT nasal spray Place 2 sprays into both nostrils daily as needed for rhinitis. For allergies  methocarbamol (ROBAXIN) 500 MG tablet Take 500 mg by mouth 2 (two) times daily.  PROLIA 60 MG/ML SOLN injection INJECT 60 MG INTO THE SKIN ONCE  ADMINISTER IN UPPER ARM, THIGH, OR ABDOMEN  ranitidine (ZANTAC) 150 MG tablet Take 1 tablet (150 mg total) by mouth 2 (two) times daily.  traMADol (ULTRAM) 50 MG tablet Take 1 tablet (50 mg total) by mouth 3 (three) times daily.   Past Medical History:  Diagnosis Date  . Allergy   . Chronic low back pain   . Compression fracture    T9 and T12 noted 05/2012 imaging. Followed by pain clinic   . Fall    Jan or Feb 2014   . Gastritis   . GERD (gastroesophageal reflux disease)   . Insomnia   . Irregular heart rate   . Osteoporosis   . Peptic ulcer disease   . Rib pain   . Right wrist fracture    3 - 4 monthes ago   Social History   Socioeconomic History  . Marital status: Widowed    Spouse name: Not on file  . Number  of children: Not on file  . Years of education: Not on file  . Highest education level: Not on file  Social Needs  . Financial resource strain: Not on file  . Food insecurity - worry: Not on file  . Food insecurity - inability: Not on file  . Transportation needs - medical: Not on file  . Transportation needs - non-medical: Not on file  Occupational History  . Not on file  Tobacco Use  . Smoking status: Never Smoker  . Smokeless tobacco: Never Used  Substance and Sexual Activity  . Alcohol use: No  . Drug use: No  . Sexual activity: No  Other Topics Concern  . Not on file  Social History Narrative   Financial assistance approved for [facility] and has [facility] per Rudell CobbDeborah Hill   11/30/2009   She is widowed, she has 2 children (only admits to 1 daughter now 07/2012), she does not smoke cigarettes or drink alcohol   Lives with daughter Tula NakayamaYang Madani [phone]               Family History  Problem Relation Age of Onset  . Colon cancer Neg Hx   . Esophageal cancer Neg Hx   . Rectal cancer Neg Hx   .  Stomach cancer Neg Hx    O:    Component Value Date/Time   CHOL 142 07/24/2013 1021   HDL 51 07/24/2013 1021   TRIG 71 07/24/2013 1021   AST 19 01/30/2015 1718   ALT 9 01/30/2015 1718   NA 141 10/06/2016 1607   K 4.5 10/06/2016 1607   CL 103 10/06/2016 1607   CO2 25 10/06/2016 1607   GLUCOSE 83 10/06/2016 1607   GLUCOSE 74 07/24/2013 1021   BUN 30 (H) 10/06/2016 1607   CREATININE 1.12 (H) 10/06/2016 1607   CREATININE 0.98 07/24/2013 1021   CALCIUM 9.2 10/06/2016 1607   GFRNONAA 47 (L) 10/06/2016 1607   GFRNONAA 57 (L) 07/24/2013 1021   GFRAA 54 (L) 10/06/2016 1607   GFRAA 65 07/24/2013 1021   WBC 4.1 11/28/2016 1540   WBC 5.2 07/24/2013 1021   HGB 10.1 (L) 11/28/2016 1540   HCT 31.1 (L) 11/28/2016 1540   PLT 224 11/28/2016 1540   TSH 2.001 08/10/2012 2157   Ht Readings from Last 2 Encounters:  11/28/16 4\' 11"  (1.499 m)  10/25/16 4\' 11"  (1.499 m)    Wt Readings from Last 2 Encounters:  11/28/16 84 lb 11.2 oz (38.4 kg)  10/25/16 89 lb (40.4 kg)   There is no height or weight on file to calculate BMI. BP Readings from Last 3 Encounters:  11/28/16 119/62  10/25/16 139/66  09/22/16 (!) 157/71   A/P:  Reviewed denosumab (Prolia), including indication, benefits, potential side effects.  Patient and daughter report the first dose around June 2018 was well-tolerated. No symptoms of concern. No questions or concerns today.  2nd administration observed in clinic today, no side effect of concern noted. Next dose due August 2019.  Monitoring parameters: calcium and vitamin D, labs ordered today, results in June 2018 WNL  Patient reports taking calcium + D supplement due to poor dietary intake, updated med list today.  An after visit summary was provided and patient advised to follow up if any changes in condition or questions regarding medications arise.  The patient and family verbalized understanding of information provided by repeating back concepts discussed.  15 minutes spent face-to-face with the patient during the encounter. 80% of time spent on education. 20% of time was spent on coordination of care.

## 2017-06-06 ENCOUNTER — Encounter: Payer: Self-pay | Admitting: Internal Medicine

## 2017-06-06 ENCOUNTER — Telehealth: Payer: Self-pay

## 2017-06-06 LAB — CBC
HEMATOCRIT: 32.9 % — AB (ref 34.0–46.6)
HEMOGLOBIN: 10.8 g/dL — AB (ref 11.1–15.9)
MCH: 31.5 pg (ref 26.6–33.0)
MCHC: 32.8 g/dL (ref 31.5–35.7)
MCV: 96 fL (ref 79–97)
Platelets: 250 10*3/uL (ref 150–379)
RBC: 3.43 x10E6/uL — AB (ref 3.77–5.28)
RDW: 14.7 % (ref 12.3–15.4)
WBC: 5.5 10*3/uL (ref 3.4–10.8)

## 2017-06-06 LAB — IRON AND TIBC
Iron Saturation: 13 % — ABNORMAL LOW (ref 15–55)
Iron: 41 ug/dL (ref 27–139)
Total Iron Binding Capacity: 305 ug/dL (ref 250–450)
UIBC: 264 ug/dL (ref 118–369)

## 2017-06-06 LAB — BMP8+ANION GAP
ANION GAP: 16 mmol/L (ref 10.0–18.0)
BUN / CREAT RATIO: 19 (ref 12–28)
BUN: 21 mg/dL (ref 8–27)
CO2: 25 mmol/L (ref 20–29)
Calcium: 9.6 mg/dL (ref 8.7–10.3)
Chloride: 101 mmol/L (ref 96–106)
Creatinine, Ser: 1.1 mg/dL — ABNORMAL HIGH (ref 0.57–1.00)
GFR, EST AFRICAN AMERICAN: 55 mL/min/{1.73_m2} — AB (ref >59)
GFR, EST NON AFRICAN AMERICAN: 48 mL/min/{1.73_m2} — AB (ref >59)
Glucose: 102 mg/dL — ABNORMAL HIGH (ref 65–99)
POTASSIUM: 4.3 mmol/L (ref 3.5–5.2)
SODIUM: 142 mmol/L (ref 134–144)

## 2017-06-06 LAB — FERRITIN: Ferritin: 53 ng/mL (ref 15–150)

## 2017-06-06 LAB — VITAMIN D 25 HYDROXY (VIT D DEFICIENCY, FRACTURES): Vit D, 25-Hydroxy: 139 ng/mL — ABNORMAL HIGH (ref 30.0–100.0)

## 2017-06-06 MED ORDER — FERROUS SULFATE 325 (65 FE) MG PO TABS: ORAL_TABLET | ORAL | 3 refills | Status: DC

## 2017-06-06 NOTE — Assessment & Plan Note (Signed)
History of osteoporosis (right femur T-score 01/24/2011 of -3.6). She was previously on alendronate for 5 years. She was transitioned to denosumab, first dose in June of 2018. Second dose received today in clinic. Appreciate pharmacy's assistance.  -- Continue denosumab injections q6 months

## 2017-06-06 NOTE — Progress Notes (Signed)
Internal Medicine Clinic Attending  Case discussed with Dr. Guilloud at the time of the visit.  We reviewed the resident's history and exam and pertinent patient test results.  I agree with the assessment, diagnosis, and plan of care documented in the resident's note.  

## 2017-06-06 NOTE — Assessment & Plan Note (Addendum)
History of iron deficiency anemia, ran out of her iron supplements one month ago. She denies blood in her stool and dark melanotic stools. Goal ferritin is > 75 due to chronic bilateral leg cramping, which have improved today since starting iron supplements.  -- Repeat iron studies -- CBC  ADDENDUM: hemoglobin stable, 10.8. Ferritin improved 24 -> 53 but still below goal. Refilled ferrous sulfate 325 mg BID, sent to [facility] on [address]. Left voicemail with results.

## 2017-06-06 NOTE — Assessment & Plan Note (Signed)
History of gastritis on EGD in February of 2015, biopsies negative for H pylori. Not on a PPI. Symptoms are currently well controlled on zantac BID prn.  -- refilled zantac prn

## 2017-06-06 NOTE — Assessment & Plan Note (Addendum)
Patient has a history of thoracic & lumbar vertebral compression with severe kyphosis on exam requiring opioid therapy. Patient has had difficulty establishing an effective pain regimen. She was previously uncontrolled on tramadol, and prior PCP escalated therapy to hydrocodone. Unfortunately she experienced side effects of dizziness and sedation. She was changed back to tramadol in July of 2018. Unfortunately pain has remained uncontrolled on current regimen of tramadol 50 mg TID prn. She reports tramadol provides enough relief throughout the day for her to be able to perform her ADLs and walk up and down the stairs. Unfortunately, she continues to have breakthrough pain, and pain especially worse at night. She has been taking Excedrin (tylenol, aspirin, & caffeine) for additional relief 4-5 times a week. Of note, patient has a history of iron deficiency anemia and gastritis seen on EGD in 2015. She has been advised by her GI doctor to avoid NSAIDs. Advised patient to avoid Excedrin and take tylenol alone if possible. To achieve better pain control, we will increase her tramadol dosing to q6h PRN. Patient has renewed pain contract with me today, urine sample collected for UDS.  -- Pain contract -- Increase tramadol 50 mg TID -> q6h PRN (#120), 2 refills provided -- Avoid NSAIDs  -- F/u UDS  ADDENDUM: UDS appropriate for tramadol & metabolites, inappropriate for Norhydrocodone, a metabolite of hydrocodone. This is likely from her old prescription. Will address with patient at follow up visit.

## 2017-06-06 NOTE — Telephone Encounter (Signed)
Called patient's mother back with results.

## 2017-06-06 NOTE — Assessment & Plan Note (Addendum)
Vitamin D level today is high, slightly above normal range at 139. Calcium is normal at 9.6. She is currently taking calcium-vitamin D supplements 500-200 mg BID. Will continue current regiment in the setting of her osteoporosis.

## 2017-06-06 NOTE — Telephone Encounter (Signed)
Requesting test results. Please call back.  

## 2017-06-10 LAB — TOXASSURE SELECT,+ANTIDEPR,UR

## 2017-07-13 NOTE — Addendum Note (Signed)
Addended by: Remus BlakeBARROW, Nimrat Woolworth K on: 07/13/2017 03:36 PM   Modules accepted: Orders

## 2017-09-04 ENCOUNTER — Ambulatory Visit (INDEPENDENT_AMBULATORY_CARE_PROVIDER_SITE_OTHER): Payer: Medicare Other | Admitting: Internal Medicine

## 2017-09-04 ENCOUNTER — Encounter: Payer: Self-pay | Admitting: Internal Medicine

## 2017-09-04 ENCOUNTER — Other Ambulatory Visit: Payer: Self-pay

## 2017-09-04 DIAGNOSIS — M549 Dorsalgia, unspecified: Secondary | ICD-10-CM | POA: Diagnosis not present

## 2017-09-04 DIAGNOSIS — G4700 Insomnia, unspecified: Secondary | ICD-10-CM

## 2017-09-04 DIAGNOSIS — M79604 Pain in right leg: Secondary | ICD-10-CM | POA: Diagnosis not present

## 2017-09-04 DIAGNOSIS — M4856XD Collapsed vertebra, not elsewhere classified, lumbar region, subsequent encounter for fracture with routine healing: Secondary | ICD-10-CM

## 2017-09-04 DIAGNOSIS — G8929 Other chronic pain: Secondary | ICD-10-CM

## 2017-09-04 DIAGNOSIS — M79605 Pain in left leg: Secondary | ICD-10-CM

## 2017-09-04 DIAGNOSIS — Z79891 Long term (current) use of opiate analgesic: Secondary | ICD-10-CM | POA: Diagnosis not present

## 2017-09-04 DIAGNOSIS — M40204 Unspecified kyphosis, thoracic region: Secondary | ICD-10-CM

## 2017-09-04 DIAGNOSIS — F1190 Opioid use, unspecified, uncomplicated: Secondary | ICD-10-CM

## 2017-09-04 MED ORDER — TRAMADOL HCL 50 MG PO TABS: 50.0000 mg | ORAL_TABLET | Freq: Four times a day (QID) | ORAL | 2 refills | Status: DC | PRN

## 2017-09-04 NOTE — Progress Notes (Signed)
   CC: Chronic pain follow up  HPI:  [name] is a 82 y.o. female with past medical history outlined below here for chronic pain follow up. For the details of today's visit, please refer to the assessment and plan.  Past Medical History:  Diagnosis Date  . Allergy   . Chronic low back pain   . Compression fracture    T9 and T12 noted 05/2012 imaging. Followed by pain clinic   . Fall    Jan or Feb 2014   . Gastritis   . GERD (gastroesophageal reflux disease)   . Insomnia   . Irregular heart rate   . Osteoporosis   . Peptic ulcer disease   . Rib pain   . Right wrist fracture    3 - 4 monthes ago    Review of Systems  Musculoskeletal: Positive for back pain. Negative for falls.    Physical Exam:  Vitals:   09/04/17 1538  BP: 132/63  Pulse: 71  Temp: 98.8 F (37.1 C)  TempSrc: Oral  SpO2: 100%  Weight: 84 lb 9.6 oz (38.4 kg)  Height: 4' 11.5" (1.511 m)   Constitutional: NAD, appears comfortable. Thin, cachetic appearing Cardiovascular: RRR, no murmurs, rubs, or gallops.  Pulmonary/Chest: CTAB, no wheezes, rales, or rhonchi.  Extremities: Warm and well perfused. Distal pulses intact. No edema.  Neurological: A&Ox3, CN II - XII grossly intact. Severe thoracic kyphosis  Psychiatric: Normal mood and affect  Assessment & Plan:   See Encounters Tab for problem based charting.  Patient discussed with Dr. Heide Spark

## 2017-09-04 NOTE — Patient Instructions (Addendum)
FOLLOW-UP INSTRUCTIONS When: 6 months PCP; 3 months pharmacy for denosomab injection For:  What to bring:   Ms. Suniga,  It was a pleasure to see you again. I have sent refills of your tramadol to your pharmacy. Please to not mix this with old prescriptions of your hydrocodone. If you need additional pain relief, I would not recommend Excedrin. Please try tylenol instead. You may take 1g up to three times a day. Please follow up with me again in 6 months. Please make an appointment with our pharmacist for your injections. If you have any questions or concerns, call our clinic at [phone] or after hours call [phone] and ask for the internal medicine resident on call. Thank you!  - Dr. Antony Contras

## 2017-09-07 ENCOUNTER — Encounter: Payer: Self-pay | Admitting: Internal Medicine

## 2017-09-07 NOTE — Assessment & Plan Note (Signed)
Patient is here for follow up of her chronic back pain. She has severe kyphosis with multiple thoracic and lumbar vertebral compression fractures. We have had difficult time establishing an effective pain regimen that is not over sedating for her. She was unable to tolerate hydrocodone. She was uncontrolled at her last visit on Tramadol 50 mg TID. We increased her dosing to 50 mg q6h prn. She established a new pain contract with me at her last visit. UDS was appropriate for tramadol, but inappropriate for hydrocodone metabolite. I addressed this today and patient reported taking a tablet of hydrocodone from her old prescription when her pain was uncontrolled. Daughter was unaware of this. I educated patient to not mix prescription pain medications and reminded her of our pain contract that her urine needs to be clean aside from tramadol. She expressed understanding. She is doing well otherwise with the increase in tramadol.  She also has chronic lower extremity pain. She takes Excedrin every night before bed for additional pain relief. I had addressed this at a prior visit. Explain to patient that Excedrin consist of caffeine, aspirin, and tylenol and is likely an ineffective therapy for her leg pain. I have advised patient against Excedrin given the high dose aspirin and risk for bleeding. She also has issues with insomnia. I again expressed my concern that Excedrin contains caffeine and is likely keeping her awake at night. Advised patient to use tylenol alone. Did not recommend NSAIDs given renal dysfunction and age.  -- Refilled tramadol 50 mg q6h PRN (#120), 2 refills -- Avoid NSAIDs, recommended against daily Excedrin use -- Tylenol for additional relief  -- Consider screening / work up for restless leg syndrome at follow up

## 2017-09-08 NOTE — Progress Notes (Signed)
Internal Medicine Clinic Attending  Case discussed with Dr. Guilloud at the time of the visit.  We reviewed the resident's history and exam and pertinent patient test results.  I agree with the assessment, diagnosis, and plan of care documented in the resident's note.  

## 2017-12-01 ENCOUNTER — Other Ambulatory Visit: Payer: Self-pay | Admitting: Pharmacist

## 2017-12-06 ENCOUNTER — Ambulatory Visit: Payer: Medicare Other | Admitting: Pharmacist

## 2017-12-06 ENCOUNTER — Encounter: Payer: Self-pay | Admitting: Internal Medicine

## 2017-12-06 ENCOUNTER — Ambulatory Visit (INDEPENDENT_AMBULATORY_CARE_PROVIDER_SITE_OTHER): Payer: Medicare Other | Admitting: Internal Medicine

## 2017-12-06 VITALS — BP 114/71 | HR 61 | Temp 98.2°F | Wt 80.5 lb

## 2017-12-06 DIAGNOSIS — Z79891 Long term (current) use of opiate analgesic: Secondary | ICD-10-CM

## 2017-12-06 DIAGNOSIS — G8929 Other chronic pain: Secondary | ICD-10-CM | POA: Diagnosis not present

## 2017-12-06 DIAGNOSIS — M5135 Other intervertebral disc degeneration, thoracolumbar region: Secondary | ICD-10-CM | POA: Diagnosis not present

## 2017-12-06 DIAGNOSIS — M8008XD Age-related osteoporosis with current pathological fracture, vertebra(e), subsequent encounter for fracture with routine healing: Secondary | ICD-10-CM | POA: Diagnosis not present

## 2017-12-06 DIAGNOSIS — M47815 Spondylosis without myelopathy or radiculopathy, thoracolumbar region: Secondary | ICD-10-CM | POA: Diagnosis not present

## 2017-12-06 DIAGNOSIS — K2970 Gastritis, unspecified, without bleeding: Secondary | ICD-10-CM

## 2017-12-06 DIAGNOSIS — M810 Age-related osteoporosis without current pathological fracture: Secondary | ICD-10-CM

## 2017-12-06 DIAGNOSIS — Z23 Encounter for immunization: Secondary | ICD-10-CM | POA: Diagnosis not present

## 2017-12-06 DIAGNOSIS — S22080A Wedge compression fracture of T11-T12 vertebra, initial encounter for closed fracture: Secondary | ICD-10-CM

## 2017-12-06 MED ORDER — FENTANYL 12 MCG/HR TD PT72: 12.5000 ug | MEDICATED_PATCH | TRANSDERMAL | 0 refills | Status: DC

## 2017-12-06 MED ORDER — DENOSUMAB 60 MG/ML ~~LOC~~ SOSY: 60.0000 mg | PREFILLED_SYRINGE | Freq: Once | SUBCUTANEOUS | 0 refills | Status: AC

## 2017-12-06 MED FILL — PROLIA 60 MG/ML SOLN: 60 | 180 days supply | Qty: 1 | Fill #0

## 2017-12-06 NOTE — Progress Notes (Signed)
Patient was seen today in a co-visit between the physician and pharmacist.  See documentation under Dr. Cannon Kettlehundi's visit for details.

## 2017-12-06 NOTE — Patient Instructions (Signed)
It was a pleasure to see you today Ms. Allison Rojas. Please make the following changes:  -Please stop taking your tramadol  -Please start taking fentanyl patch that should be changed every 3 days  -I have referred you to interventional radiology to discuss possible interventions for your back pain   If you have any questions or concerns, please call our clinic at [phone] between 9am-5pm and after hours call [phone] and ask for the internal medicine resident on call. If you feel you are having a medical emergency please call 911.   Thank you, we look forward to help you remain healthy!  Lorenso CourierVahini Jeanny Rymer, MD Internal Medicine PGY2

## 2017-12-06 NOTE — Progress Notes (Signed)
   CC: Back pain  HPI:  Allison Rojas is a 82 y.o. female with osteoporosis, gastritis, compression fracture of T12 vertebra who presents for back pain. Please see problem based charting for evaluation, assessment, and plan.  Past Medical History:  Diagnosis Date  . Allergy   . Chronic low back pain   . Compression fracture    T9 and T12 noted 05/2012 imaging. Followed by pain clinic   . Fall    Jan or Feb 2014   . Gastritis   . GERD (gastroesophageal reflux disease)   . Insomnia   . Irregular heart rate   . Osteoporosis   . Peptic ulcer disease   . Rib pain   . Right wrist fracture    3 - 4 monthes ago   Review of Systems:    Has back pain Denies shortness of breath   Physical Exam:  Vitals:   12/06/17 1524  BP: 114/71  Pulse: 61  Temp: 98.2 F (36.8 C)  TempSrc: Oral  SpO2: 98%  Weight: 80 lb 8 oz (36.5 kg)   Physical Exam  Constitutional: She appears well-developed and well-nourished. No distress.  HENT:  Head: Normocephalic and atraumatic.  Eyes: Conjunctivae are normal.  Cardiovascular: Normal rate, regular rhythm and normal heart sounds.  Respiratory: Effort normal and breath sounds normal. No respiratory distress. She has no wheezes.  GI: Soft. Bowel sounds are normal. She exhibits no distension. There is no tenderness.  Musculoskeletal: She exhibits tenderness (lower back pain upon palpation). She exhibits no edema.  Neurological: She is alert.  Skin: She is not diaphoretic. No erythema.  Psychiatric: She has a normal mood and affect. Her behavior is normal. Judgment and thought content normal.   Assessment & Plan:   See Encounters Tab for problem based charting.  Osteoporosis Compression fracture of T12 vertebra The patient presents with her daughter to discuss regarding her lower back pain that has been present for the past several years. She has severe osteoporosis that is being treated with prolia.    Per imaging she had several compression  fractures at the T9, T12, L1 vertebrae.  Her T score in October 2012 was -3.6.  She has lower lumbar spondylosis and degenerative disc disease.  Assessment and plan  The patient has severe osteoporotic changes and kyphosis that is causing the patient's back pain.  Pain management for the patient has been difficult in the past.  She has been on tramadol and Norco.  Norco has made the patient very sedated in the past.  She is currently on tramadol 50 mg every 6 hours as needed.  The patient has been using the tramadol 4 times daily and she states that her pain is still 7/10 in intensity with the pain medication.  Therefore, it was decided to discontinue the tramadol and start the patient on Fentanyl 12.5 mcg every 72 hours.   Will make interventional radiology referral to have the patient assessed to determine whether any intervention can be made for her osteoporotic spine changes.   -Interventional radiology referral  -Instructed patient to stop tramadol.   -Prescribed fentanyl 1 patch (12.5mg ) q3 days for 5 days  However, the patient's daughter states that if the fentanyl patch does not work she would like for the patient to be transitioned back to tramadol.  Patient discussed with Dr. Cyndie ChimeGranfortuna

## 2017-12-07 ENCOUNTER — Other Ambulatory Visit: Payer: Self-pay | Admitting: Internal Medicine

## 2017-12-07 ENCOUNTER — Telehealth: Payer: Self-pay | Admitting: Internal Medicine

## 2017-12-07 NOTE — Telephone Encounter (Signed)
Pls call pharmacy regarding fentaNYL (DURAGESIC - DOSED MCG/HR) 12 MCG/HR, normally they start around 60mcg, pt has been ding 20mcg in the past 30days, they wanted to double check to make sure the mcg was correct on the new order; contact pharmacy # [phone]

## 2017-12-07 NOTE — Assessment & Plan Note (Signed)
The patient presents with her daughter to discuss regarding her lower back pain that has been present for the past several years. She has severe osteoporosis that is being treated with prolia.    Per imaging she had several compression fractures at the T9, T12, L1 vertebrae.  Her T score in October 2012 was -3.6.  She has lower lumbar spondylosis and degenerative disc disease.  Assessment and plan  The patient has severe osteoporotic changes and kyphosis that is causing the patient's back pain.  Pain management for the patient has been difficult in the past.  She has been on tramadol and Norco.  Norco has made the patient very sedated in the past.  She is currently on tramadol 50 mg every 6 hours as needed.  The patient has been using the tramadol 4 times daily and she states that her pain is still 7/10 in intensity with the pain medication.  Therefore, it was decided to discontinue the tramadol and start the patient on Fentanyl 12.5 mcg every 72 hours.   Will make interventional radiology referral to have the patient assessed to determine whether any intervention can be made for her osteoporotic spine changes.   -Interventional radiology referral  -Instructed patient to stop tramadol.   -Prescribed fentanyl 1 patch (12.5mg ) q3 days for 5 days  However, the patient's daughter states that if the fentanyl patch does not work she would like for the patient to be transitioned back to tramadol.

## 2017-12-07 NOTE — Telephone Encounter (Signed)
Spoke to pharmacist and mentioned that her fentanyl patch dosage is appropriate at this time and requested her to dispense it.

## 2017-12-07 NOTE — Progress Notes (Signed)
Medicine attending: Medical history, presenting problems, physical findings, and medications, reviewed with resident physician Dr Lorenso CourierVahini Chundi on the day of the patient visit and I concur with her evaluation and management plan. Unfortunately, not much to offer this elderly lady w chronic back pain; gastritis so can't use NSAIDS; hypersensitive to sedative effects of oxycodone; at max dose of tramadol. We will try low dose transderm fentanyl 12.5 micrograms Q 3 d & add an anitdepressant for central activiy on pain pathways.

## 2017-12-08 ENCOUNTER — Ambulatory Visit: Payer: Medicare Other | Admitting: Pharmacist

## 2017-12-21 ENCOUNTER — Other Ambulatory Visit: Payer: Self-pay | Admitting: Internal Medicine

## 2017-12-21 ENCOUNTER — Telehealth: Payer: Self-pay | Admitting: *Deleted

## 2017-12-21 NOTE — Telephone Encounter (Signed)
Thank you for the information.  Please inform Ms. Allison Rojas we strongly recommend not changing the patch every 2 days.  She will run out early and it will not be refilled early.  Changing the patch every 2 days will not make the pain any better.  Adjustments in her regimen will require a visit and she is encouraged to make an appointment when she can.  Thanks.

## 2017-12-21 NOTE — Telephone Encounter (Signed)
Called / explained to pt's daughter to keep patches every 3 days; "Changing the patch every 2 days will not make the pain any better."per Dr Josem Kaufmann . And will need to make an appt to change regimen as soon as possible. Voiced understanding. Stated she will call back to make the appt.

## 2017-12-21 NOTE — Telephone Encounter (Signed)
Called pt's daughter, Swetha Magnone - stated her mother really likes the patches vs taking Tramadol but now she's having cramps and the patches are not lasting 3 days. She would like to change to every 2 days. Informed she will need to make an appt to see the doctor but daughter stated she has to work and cannot bring her mother. Stated she does not want to re-start taking Tramadol. Told her I will inform the doctor.

## 2017-12-21 NOTE — Telephone Encounter (Signed)
Refill Request   fentaNYL (DURAGESIC - DOSED MCG/HR) 12 MCG/HR

## 2017-12-21 NOTE — Telephone Encounter (Signed)
It is too early for a refill. Patient last seen 8/22 with pain due to compression fracture. They started low dose fentanyl patch, one patch to last 3 days, prescribed 10, database says she filled it on 8/27. Should last a month, has only been a little over a week, not sure why this refill is being requested so early. Can we call them to clarify that they are letting the patch stay on for a full 3 days, it should not be replaced daily. It may be good to have a follow up visit as well to make sure she is tolerating this dose and decide if it is safe to continue.

## 2018-01-01 ENCOUNTER — Ambulatory Visit (INDEPENDENT_AMBULATORY_CARE_PROVIDER_SITE_OTHER): Payer: Medicare Other | Admitting: Internal Medicine

## 2018-01-01 ENCOUNTER — Other Ambulatory Visit: Payer: Self-pay

## 2018-01-01 ENCOUNTER — Encounter: Payer: Self-pay | Admitting: Internal Medicine

## 2018-01-01 VITALS — BP 115/62 | HR 69 | Temp 98.9°F | Ht 59.5 in | Wt 82.2 lb

## 2018-01-01 DIAGNOSIS — Z87311 Personal history of (healed) other pathological fracture: Secondary | ICD-10-CM | POA: Diagnosis not present

## 2018-01-01 DIAGNOSIS — R252 Cramp and spasm: Secondary | ICD-10-CM | POA: Diagnosis not present

## 2018-01-01 DIAGNOSIS — M79604 Pain in right leg: Secondary | ICD-10-CM

## 2018-01-01 DIAGNOSIS — G8929 Other chronic pain: Secondary | ICD-10-CM | POA: Diagnosis not present

## 2018-01-01 DIAGNOSIS — Z79891 Long term (current) use of opiate analgesic: Secondary | ICD-10-CM

## 2018-01-01 DIAGNOSIS — F1190 Opioid use, unspecified, uncomplicated: Secondary | ICD-10-CM

## 2018-01-01 DIAGNOSIS — M79605 Pain in left leg: Secondary | ICD-10-CM

## 2018-01-01 DIAGNOSIS — M40204 Unspecified kyphosis, thoracic region: Secondary | ICD-10-CM

## 2018-01-01 MED ORDER — FENTANYL 12 MCG/HR TD PT72: 12.5000 ug | MEDICATED_PATCH | TRANSDERMAL | 0 refills | Status: AC

## 2018-01-01 NOTE — Assessment & Plan Note (Signed)
Patient continues to have chronic bilateral leg cramping of unclear etiology. Pain starts at her lateral mid shin and radiates down her leg. Pain has now spread to her toes. She has had extensive work up in the past without clear cause. Pain does not fit the pattern of peripheral neuropathy and description is inconsistent. She has ABIs with good perfusion 1 year ago. She was previously started on iron supplements for iron deficiency anemia potentially contributing. Unclear if patient is still taking - unable to address prior to her leaving the office. Will plan to repeat a ferritin at follow up. Advised patient to start a trial of over the counter B vitamins. -- Repeat ferritin 1 month  -- If pain continues despite improvement in her iron deficiency, consider EMG testing as last resort -- Trial of OTC B-complex vitamins

## 2018-01-01 NOTE — Progress Notes (Signed)
   CC: Chronic pain follow up  HPI:  Allison Rojas is a 82 y.o. female with past medical history outlined below here for chronic pain follow up. For the details of today's visit, please refer to the assessment and plan.  Past Medical History:  Diagnosis Date  . Allergy   . Chronic low back pain   . Compression fracture    T9 and T12 noted 05/2012 imaging. Followed by pain clinic   . Fall    Jan or Feb 2014   . Gastritis   . GERD (gastroesophageal reflux disease)   . Insomnia   . Irregular heart rate   . Osteoporosis   . Peptic ulcer disease   . Rib pain   . Right wrist fracture    3 - 4 monthes ago    Review of Systems  Musculoskeletal: Negative for falls.  Neurological: Negative for dizziness.     Physical Exam:  Vitals:   01/01/18 1545  BP: 115/62  Pulse: 69  Temp: 98.9 F (37.2 C)  TempSrc: Oral  SpO2: 99%  Weight: 82 lb 3.2 oz (37.3 kg)  Height: 4' 11.5" (1.511 m)    Constitutional: NAD, appears comfortable. Thin, cachetic appearing Cardiovascular: RRR, no murmurs, rubs, or gallops.  Pulmonary/Chest: CTAB, no wheezes, rales, or rhonchi.  Extremities: Warm and well perfused. Distal pulses intact. No edema.  Neurological: A&Ox3, CN II - XII grossly intact. Severe thoracic kyphosis    Assessment & Plan:   See Encounters Tab for problem based charting.  Patient discussed with Dr. Criselda PeachesMullen

## 2018-01-01 NOTE — Assessment & Plan Note (Signed)
Patient is here for chronic pain follow up. She has a history of thoracic & lumbar vertebral compression fractures with severe kyphosis on exam requiring opioid therapy. We have had difficulty establishing an effective pain regimen. At her last visit, she continued to have pain despite increase in her tramadol dosing, and she was escalated to transdermal fentanyl 12.5 mg q72 hours. Patient reports good pain relief with the patch and denies over sedation, however reports the effects wear off after 2 and 1/2 days. She has been taking tramadol in between to bridge until her next patch. Again, I advised patient to take only what is prescribed and not take old prescriptions. This has been an issue in the past. I reminded her of our pain contract and that is important that she does not mix opioid pain relievers. Instructed patient to call and come in sooner for follow up appointment if pain is uncontrolled rather than self titrating with old prescriptions as home. We will increase the frequency of her transdermal fentanyl patch to q48 hours today. She has agreed to stop taking tramadol. She is aware that her UDS must be clean of other opioids at her follow up appointment.  -- Increase transdermal fentanyl patch 12.5 mg q72h -> q48h. -- Follow up 1 month -- UDS at follow up and new pain contract if regimen is effective

## 2018-01-01 NOTE — Patient Instructions (Addendum)
FOLLOW-UP INSTRUCTIONS When: 1 month For: Chronic pain What to bring: N/A  Ms. Allison Rojas,  It was a pleasure to see you. I am glad to hear the patch is helping you. I have increased the dosing to every 2 days. It is important that you do not take any other pain medication while on this patch! Please return in 1 month for follow up. If you have any questions or concerns, call our clinic at [phone] or after hours call [phone] and ask for the internal medicine resident on call. Thank you!  - Dr. Antony ContrasGuilloud

## 2018-01-08 NOTE — Progress Notes (Signed)
I was not the attending 

## 2018-01-10 ENCOUNTER — Other Ambulatory Visit (INDEPENDENT_AMBULATORY_CARE_PROVIDER_SITE_OTHER): Payer: Medicare Other

## 2018-01-10 ENCOUNTER — Encounter: Payer: Self-pay | Admitting: Gastroenterology

## 2018-01-10 ENCOUNTER — Ambulatory Visit (INDEPENDENT_AMBULATORY_CARE_PROVIDER_SITE_OTHER): Payer: Medicare Other | Admitting: Gastroenterology

## 2018-01-10 VITALS — BP 130/60 | HR 68 | Ht <= 58 in | Wt 82.5 lb

## 2018-01-10 DIAGNOSIS — R634 Abnormal weight loss: Secondary | ICD-10-CM

## 2018-01-10 DIAGNOSIS — R109 Unspecified abdominal pain: Secondary | ICD-10-CM

## 2018-01-10 LAB — CBC WITH DIFFERENTIAL/PLATELET
BASOS PCT: 0.9 % (ref 0.0–3.0)
Basophils Absolute: 0 10*3/uL (ref 0.0–0.1)
EOS PCT: 3.3 % (ref 0.0–5.0)
Eosinophils Absolute: 0.2 10*3/uL (ref 0.0–0.7)
HEMATOCRIT: 33 % — AB (ref 36.0–46.0)
HEMOGLOBIN: 11 g/dL — AB (ref 12.0–15.0)
LYMPHS PCT: 31.6 % (ref 12.0–46.0)
Lymphs Abs: 1.5 10*3/uL (ref 0.7–4.0)
MCHC: 33.4 g/dL (ref 30.0–36.0)
MCV: 93.5 fl (ref 78.0–100.0)
MONOS PCT: 6.7 % (ref 3.0–12.0)
Monocytes Absolute: 0.3 10*3/uL (ref 0.1–1.0)
NEUTROS ABS: 2.8 10*3/uL (ref 1.4–7.7)
Neutrophils Relative %: 57.5 % (ref 43.0–77.0)
PLATELETS: 166 10*3/uL (ref 150.0–400.0)
RBC: 3.53 Mil/uL — ABNORMAL LOW (ref 3.87–5.11)
RDW: 13.9 % (ref 11.5–15.5)
WBC: 4.9 10*3/uL (ref 4.0–10.5)

## 2018-01-10 LAB — COMPREHENSIVE METABOLIC PANEL
ALT: 11 U/L (ref 0–35)
AST: 17 U/L (ref 0–37)
Albumin: 4.5 g/dL (ref 3.5–5.2)
Alkaline Phosphatase: 37 U/L — ABNORMAL LOW (ref 39–117)
BUN: 21 mg/dL (ref 6–23)
CHLORIDE: 104 meq/L (ref 96–112)
CO2: 30 meq/L (ref 19–32)
CREATININE: 1.12 mg/dL (ref 0.40–1.20)
Calcium: 9.7 mg/dL (ref 8.4–10.5)
GFR: 49.74 mL/min — ABNORMAL LOW (ref >60.00)
Glucose, Bld: 102 mg/dL — ABNORMAL HIGH (ref 70–99)
POTASSIUM: 4.5 meq/L (ref 3.5–5.1)
SODIUM: 140 meq/L (ref 135–145)
Total Bilirubin: 0.4 mg/dL (ref 0.2–1.2)
Total Protein: 7.6 g/dL (ref 6.0–8.3)

## 2018-01-10 NOTE — Patient Instructions (Addendum)
You will have labs checked today in the basement lab.  Please head down after you check out with the front desk  (cbc, cmet)  You will be set up for a CT scan of abdomen and pelvis with IV and oral contrast for epigastric pain, weight loss.  Please start taking the ranitidine every night just before you lay down for bed.  Continue avoiding Excedrine.  You have been scheduled for a CT scan of the abdomen and pelvis at [facility] ([address]---this is in the same building as Press [facility]).   You are scheduled on 01/24/18 at 1000. You should arrive 15 minutes prior to your appointment time for registration. Please follow the written instructions below on the day of your exam:  WARNING: IF YOU ARE ALLERGIC TO IODINE/X-RAY DYE, PLEASE NOTIFY RADIOLOGY IMMEDIATELY AT [phone]! YOU WILL BE GIVEN A 13 HOUR PREMEDICATION PREP.  1) Do not eat or drink anything after 6am (4 hours prior to your test) 2) You have been given 2 bottles of oral contrast to drink. The solution may taste better if refrigerated, but do NOT add ice or any other liquid to this solution. Shake well before drinking.    Drink 1 bottle of contrast @ 8 (2 hours prior to your exam)  Drink 1 bottle of contrast @ 9 (1 hour prior to your exam)  You may take any medications as prescribed with a small amount of water, if necessary. If you take any of the following medications: METFORMIN, GLUCOPHAGE, GLUCOVANCE, AVANDAMET, RIOMET, FORTAMET, [place] MET, JANUMET, GLUMETZA or METAGLIP, you MAY be asked to HOLD this medication 48 hours AFTER the exam.  The purpose of you drinking the oral contrast is to aid in the visualization of your intestinal tract. The contrast solution may cause some diarrhea. Depending on your individual set of symptoms, you may also receive an intravenous injection of x-ray contrast/dye. Plan on being at [facility] for 30 minutes or longer, depending on the type of exam you are having  performed.  This test typically takes 30-45 minutes to complete.  If you have any questions regarding your exam or if you need to reschedule, you may call the CT department at [phone] between the hours of 8:00 am and 5:00 pm, Monday-Friday.  ________________________________________________________________________

## 2018-01-10 NOTE — Progress Notes (Signed)
iew of pertinent gastrointestinal problems: 1. Moderate gastritis (EGD , Allison Rojas 05/2010), biopsies showed no h. Pylori.  Was taking ASA, NSAID.  Previous ulcer noted in [place] per family. EGD 05/2013 Dr. Christella Rojas pan-gastritis, [name] without Cameron's erosions, tortuous esophagus.   HPI: This is a pleasant 82 year old woman who was referred to me by Allison Poll, MD  to evaluate abdominal pain, GERD.    Chief complaint is abdominal pain, GERD  She is here with her daughter today.  Neither of them speak [place].  There was a Adult nurse present who helps with the visit today but as always small but possibly important nuances of history were "lost in translation"  I last saw her February 2015 at the time of an upper endoscopy.  She had been having unexplained weight loss around that time.  The endoscopy report is summarized above.   She has lost another 11 pounds since she was here in the office 5 years ago.  At that time she had lost about 20 pounds in the preceding 2-1/2 years.  I recommended that we get her up-to-date on colon cancer screening since she had never had colonoscopy that we are aware of where she was aware of.  She initially agreed and then she decided against it.  At the time of her upper endoscopy in February 2015 I again recommended that she reconsider doing a colonoscopy.  We have not heard from her since.  She has back pains chronically.    She used to take excedrine 2 pills for many months.  But this stopped when she started taking the fentanyl patch 2 or 3 months ago  Around 2-3 in AM she has epigastric pain.  This occurs just about every other morning.  15-30 minutes. She will take a ranitidine nd it usually helps.   She never takes the ranitidine until she has pain.  Burning type pain.  Not sharp.  Nausea is associated.  She "has a problem with digestion."  But she cannot elaborate any further except saying she has trouble with digestion  Admits she takes a  lot of stomach medicines from [place].  They are not sure what the medicine is but say it is very common to take in [place].  She is on a fentanyl patch (for back and leg pains).     Old Data Reviewed:     Review of systems: Pertinent positive and negative review of systems were noted in the above HPI section. All other review negative.   Past Medical History:  Diagnosis Date  . Allergy   . Chronic low back pain   . Compression fracture    T9 and T12 noted 05/2012 imaging. Followed by pain clinic   . Fall    Jan or Feb 2014   . Gastritis   . GERD (gastroesophageal reflux disease)   . Insomnia   . Irregular heart rate   . Osteoporosis   . Peptic ulcer disease   . Rib pain   . Right wrist fracture    3 - 4 monthes ago    Past Surgical History:  Procedure Laterality Date  . NO PAST SURGERIES      Current Outpatient Medications  Medication Sig Dispense Refill  . acetaminophen (TYLENOL) 500 MG tablet Take 1 tablet (500 mg total) by mouth 3 (three) times daily. 30 tablet 0  . Ascorbic Acid (VITAMIN C) 100 MG CHEW Chew 1 tablet (100 mg total) by mouth daily. 90 each 0  . calcium-vitamin D (  OSCAL WITH D) 500-200 MG-UNIT tablet Take 1 tablet by mouth 2 (two) times daily. 60 tablet 0  . diclofenac sodium (VOLTAREN) 1 % GEL Apply 4 g topically 4 (four) times daily. 100 g 0  . fentaNYL (DURAGESIC - DOSED MCG/HR) 12 MCG/HR Place 1 patch (12.5 mcg total) onto the skin every other day. 15 patch 0  . ferrous sulfate 325 (65 FE) MG tablet TAKE ONE TABLET BY MOUTH TWICE A DAY WITH A MEAL 60 tablet 3  . fluticasone (FLONASE) 50 MCG/ACT nasal spray Place 2 sprays into both nostrils daily as needed for rhinitis. For allergies 16 g 1  . PROLIA 60 MG/ML SOSY injection Inject 60 mg into the skin every 6 (six) months.  0  . ranitidine (ZANTAC) 150 MG tablet Take 1 tablet (150 mg total) by mouth 2 (two) times daily. 60 tablet 2  . ranitidine (ZANTAC) 150 MG tablet TAKE ONE TABLET BY MOUTH TWICE A  DAY AS NEEDED FOR HEARTBURN 180 tablet 4   No current facility-administered medications for this visit.     Allergies as of 01/10/2018 - Review Complete 01/10/2018  Allergen Reaction Noted  . Amitriptyline  08/10/2012    Family History  Problem Relation Age of Onset  . Colon cancer Neg Hx   . Esophageal cancer Neg Hx   . Rectal cancer Neg Hx   . Stomach cancer Neg Hx     Social History   Socioeconomic History  . Marital status: Widowed    Spouse name: Not on file  . Number of children: 1  . Years of education: Not on file  . Highest education level: Not on file  Occupational History  . Not on file  Social Needs  . Financial resource strain: Not on file  . Food insecurity:    Worry: Not on file    Inability: Not on file  . Transportation needs:    Medical: Not on file    Non-medical: Not on file  Tobacco Use  . Smoking status: Never Smoker  . Smokeless tobacco: Never Used  Substance and Sexual Activity  . Alcohol use: No  . Drug use: No  . Sexual activity: Never  Lifestyle  . Physical activity:    Days per week: Not on file    Minutes per session: Not on file  . Stress: Not on file  Relationships  . Social connections:    Talks on phone: Not on file    Gets together: Not on file    Attends religious service: Not on file    Active member of club or organization: Not on file    Attends meetings of clubs or organizations: Not on file    Relationship status: Not on file  . Intimate partner violence:    Fear of current or ex partner: Not on file    Emotionally abused: Not on file    Physically abused: Not on file    Forced sexual activity: Not on file  Other Topics Concern  . Not on file  Social History Narrative   Financial assistance approved for 100% discount at [facility] and has [facility] card per [name]   11/30/2009   She is widowed, she has 2 children (only admits to 1 daughter now 07/2012), she does not smoke cigarettes or drink alcohol   Lives with  daughter Allison Rojas [phone]                 Physical Exam: BP 130/60 (BP  Location: Left Arm, Patient Position: Sitting, Cuff Size: Normal)   Pulse 68   Ht 4' 9.75" (1.467 m) Comment: height measured without shoes  Wt 82 lb 8 oz (37.4 kg)   BMI 17.39 kg/m  Constitutional: generally well-appearing Psychiatric: alert and oriented x3 Eyes: extraocular movements intact Mouth: oral pharynx moist, no lesions Neck: supple no lymphadenopathy Cardiovascular: heart regular rate and rhythm Lungs: clear to auscultation bilaterally Abdomen: soft, nontender, nondistended, no obvious ascites, no peritoneal signs, normal bowel sounds Extremities: no lower extremity edema bilaterally Skin: no lesions on visible extremities   Assessment and plan: 82 y.o. female with intermittent epigastric pain, somewhat GERD-like.  Chronic weight loss  She wakes up every other day with burning in her epigastrium.  This burning is usually helps with over-the-counter H2 blocker ranitidine.  I recommended that she start taking the ranitidine at bedtime rather than waiting to be awoken by her epigastric pains that are likely GERD related in the early morning.  She is losing weight and honestly with difficulties in history taking that is always present, nuances and details of history are often lost in translation despite adequate time and a professional translator, I think it is best to arrange for general labs (cbc, cmet) and CT scan abdomen pelvis to be safe we are not missing something that can be causing her intermittent epigastric pains and weight loss.  She knows to continue to avoid Excedrin which she has been taking twice daily up until couple months ago when her mother doctors started her on a fentanyl patch for her back and leg pains.    Please see the "Patient Instructions" section for addition details about the plan.   Rob Bunting, MD [facility] 01/10/2018, 10:13 AM  Cc: Allison Poll, MD

## 2018-01-11 NOTE — Progress Notes (Signed)
Internal Medicine Clinic Attending  Case discussed with Dr. Guilloud at the time of the visit.  We reviewed the resident's history and exam and pertinent patient test results.  I agree with the assessment, diagnosis, and plan of care documented in the resident's note.  

## 2018-01-19 DIAGNOSIS — Z23 Encounter for immunization: Secondary | ICD-10-CM | POA: Diagnosis not present

## 2018-01-24 ENCOUNTER — Ambulatory Visit (INDEPENDENT_AMBULATORY_CARE_PROVIDER_SITE_OTHER)
Admission: RE | Admit: 2018-01-24 | Discharge: 2018-01-24 | Disposition: A | Discharge status: Home / Self Care | Payer: Medicare Other | Source: Ambulatory Visit | Attending: Gastroenterology | Admitting: Gastroenterology

## 2018-01-24 DIAGNOSIS — R634 Abnormal weight loss: Secondary | ICD-10-CM

## 2018-01-24 DIAGNOSIS — R109 Unspecified abdominal pain: Secondary | ICD-10-CM

## 2018-01-24 MED ORDER — IOPAMIDOL (ISOVUE-300) INJECTION 61%
75.0000 mL | Freq: Once | INTRAVENOUS | Status: AC | PRN
  Administered 2018-01-24: 75 mL via INTRAVENOUS

## 2018-02-12 ENCOUNTER — Encounter: Payer: Self-pay | Admitting: Internal Medicine

## 2018-02-12 ENCOUNTER — Ambulatory Visit (INDEPENDENT_AMBULATORY_CARE_PROVIDER_SITE_OTHER): Payer: Medicare Other | Admitting: Internal Medicine

## 2018-02-12 ENCOUNTER — Other Ambulatory Visit: Payer: Self-pay

## 2018-02-12 VITALS — BP 122/74 | HR 70 | Temp 98.2°F | Ht 59.5 in | Wt 80.7 lb

## 2018-02-12 DIAGNOSIS — F1190 Opioid use, unspecified, uncomplicated: Secondary | ICD-10-CM

## 2018-02-12 DIAGNOSIS — M79605 Pain in left leg: Secondary | ICD-10-CM

## 2018-02-12 DIAGNOSIS — E538 Deficiency of other specified B group vitamins: Secondary | ICD-10-CM

## 2018-02-12 DIAGNOSIS — M40209 Unspecified kyphosis, site unspecified: Secondary | ICD-10-CM | POA: Diagnosis not present

## 2018-02-12 DIAGNOSIS — D509 Iron deficiency anemia, unspecified: Secondary | ICD-10-CM

## 2018-02-12 DIAGNOSIS — Z87311 Personal history of (healed) other pathological fracture: Secondary | ICD-10-CM | POA: Diagnosis not present

## 2018-02-12 DIAGNOSIS — Z79891 Long term (current) use of opiate analgesic: Secondary | ICD-10-CM | POA: Diagnosis not present

## 2018-02-12 DIAGNOSIS — G8929 Other chronic pain: Secondary | ICD-10-CM | POA: Diagnosis not present

## 2018-02-12 DIAGNOSIS — M79604 Pain in right leg: Secondary | ICD-10-CM | POA: Diagnosis not present

## 2018-02-12 DIAGNOSIS — D649 Anemia, unspecified: Secondary | ICD-10-CM | POA: Diagnosis not present

## 2018-02-12 DIAGNOSIS — Z8719 Personal history of other diseases of the digestive system: Secondary | ICD-10-CM

## 2018-02-12 DIAGNOSIS — M519 Unspecified thoracic, thoracolumbar and lumbosacral intervertebral disc disorder: Secondary | ICD-10-CM

## 2018-02-12 DIAGNOSIS — G4700 Insomnia, unspecified: Secondary | ICD-10-CM | POA: Diagnosis not present

## 2018-02-12 MED ORDER — HYDROCODONE-ACETAMINOPHEN 7.5-325 MG PO TABS: 1.0000 | ORAL_TABLET | Freq: Three times a day (TID) | ORAL | 0 refills | Status: DC

## 2018-02-12 MED ORDER — GABAPENTIN 300 MG PO CAPS: 300.0000 mg | ORAL_CAPSULE | Freq: Three times a day (TID) | ORAL | 0 refills | Status: DC

## 2018-02-12 NOTE — Progress Notes (Signed)
   CC: Chronic pain follow up  HPI:  [name] is a 82 y.o. female with past medical history outlined below here for chronic pain follow up. For the details of today's visit, please refer to the assessment and plan.  Past Medical History:  Diagnosis Date  . Allergy   . Chronic low back pain   . Compression fracture    T9 and T12 noted 05/2012 imaging. Followed by pain clinic   . Fall    Jan or Feb 2014   . Gastritis   . GERD (gastroesophageal reflux disease)   . Insomnia   . Irregular heart rate   . Osteoporosis   . Peptic ulcer disease   . Rib pain   . Right wrist fracture    3 - 4 monthes ago    Review of Systems  Musculoskeletal: Positive for back pain. Negative for falls.       Leg pain    Physical Exam:  Vitals:   02/12/18 1337  BP: 122/74  Pulse: 70  Temp: 98.2 F (36.8 C)  TempSrc: Oral  SpO2: 100%  Weight: 80 lb 11.2 oz (36.6 kg)  Height: 4' 11.5" (1.511 m)    Constitutional: NAD, appears comfortable. Thin, cachetic appearing Cardiovascular:RRR, no murmurs, rubs, or gallops.  Pulmonary/Chest:CTAB, no wheezes, rales, or rhonchi.  Extremities: Warm and well perfused. Distal pulses intact. No edema.  Neurological:A&Ox3, CN II - XII grossly intact. Severe kyphosis  Assessment & Plan:   See Encounters Tab for problem based charting.  Patient discussed with Dr. Oswaldo Done

## 2018-02-12 NOTE — Assessment & Plan Note (Addendum)
Patient is here for follow up of her chronic back pain. She has degenerative disc disease with prior compression fracture and severe kyphosis on exam. She has been on an opioid pain contract with our clinic now since at least 2012. Since establishing as her PCP, we have had a very difficult time finding and effective regimen. We have changed multiple times between different therapies (tramadol, hydrocodone, and now fentanyl) and dose changes at nearly every visit. Despite this, we have been unable to adequately control her pain. Unfortunately patient speaks [place] and there is a significant language barrier despite the use of an Science writer during visits. Her last two urine tox screens have been inappropriate, as she self titrate's with old prescriptions when her prescribed regimen is ineffective. A significant amount of time is spent at each visit educating patient on not mixing opioid prescriptions, and reminding her of the terms of our pain contract, that prescriptions must be taken as prescribed, and urine must be clean of other substances even if it is an old prescription. Unfortunately patient continues to take medicine inappropriately. Two visits ago, she was changed to a transdermal fentanyl patch q72 hours. She followed up with me one month ago and reported significant improvement in her pain, unfortunately relief only lasted 2 and 1/2 days and she was self treating with tramadol once effectiveness wore off in between patches. Again I went over the terms of our pain contracted and educated patient on safety concerns with mixing opioid pain relievers and taking more than is prescribed. We increased the frequency of her patch to q48 hours. Today she now tells me the patch only last for 24 hours, again self treating with tramadol in addition to the patch and requesting to increase frequency. We had a long discussion today regarding the risks and benefits of continuing with long term opioid therapy. Given  our failure to establish an effective regimen over the past year despite frequent changes to her regimen and her inability to follow the rules of our contract, I have recommend we taper off opioids altogether. After further questioning, it seems the real reason for increasing difficulty with pain control is her worsening bilateral leg pains, and not her back. With this knowledge, we came up with a mutual plan to taper off opioids altogether and focus on non opioid therapies and further work up for her leg pains. She is currently taking transdermal fentanyl 12.5 mcg, which is 30 morphine equivalent. Will transition to PO hydrocodone with a 25% cross tolerance reduction.  -- Norco 7.5-325 mg TID (#90, no refills) -- Follow up in 1 month for dose reduction

## 2018-02-12 NOTE — Patient Instructions (Signed)
Ms. Allison Rojas,  It was a pleasure to see you again. I am sorry to hear about your leg pain. Please stop using your patches. Instead, starting taking hydrocodone three times daily. We will slowly reduce this each much until we discontinue it altogether. I am starting you on another medicine called gabapentin. You may take this three times a day for your leg pain. We can increase this at your next visit if needed. I will call you with the results of your lab work today. Follow up with me again in 1 month. If you have any questions or concerns, call our clinic at [phone] or after hours call [phone] and ask for the internal medicine resident on call. Thank you!  Dr. Antony Contras

## 2018-02-12 NOTE — Assessment & Plan Note (Addendum)
Patient's main complaint today is her worsening bilateral leg pains. She describes bilateral "cramping" that starts in her thighs and radiate down her legs to her ankles. Pain is constant all day, but worse at night keeping her awake. She has been escalating her opioid therapy due to this pain which is severe and causing her significant distress. She reports the only thing that helps the pain is Excedrin, which she has been instructed by me and her gastroenterologist to stop taking due to her iron deficiency anemia and prior NSAID induced gastritis (EGD 2015). Etiology of leg pain remains unclear. Pain does not fit the pattern of peripheral neuropathy nor does the description sound consistent. Pain does not follow a specific dermatome. She had POC ABIs performed in our clinic in July of 2017 with good perfusion bilaterally. The only significant finding from her work up is iron deficiency anemia, for which she has been on iron supplements now for a few months. Given her progressive symptoms and relief with aspirin (excedrin), it may be worth obtaining formal ABIs. Unfortunately if PVD is found, I doubt she is a candidate for aspirin therapy. Cilostazol may be an option or revascularization if significat disease is found. Her ability to exercise is severely limited due to her lumbar disc disease and chronic pain. Doubt she could participate in graded exercise program. If repeat ABIs are non contributory, consider referral for EMG testing to rule out neuropathy.  -- Continue ferrous sulfate 325 mg daily  -- Repeat Ferritin today; if still low, may need IV iron. Was seen by GI 9/25, no plan for repeat endoscopy at this time -- Repeat Vit B12 -- Ordered ABIs -- Start gapapentin 300 mg TID; up titrate to efficacy. If ineffective, recommend other non opioid therapies like amitriptyline. Patient does suffer from insomnia, sedative side effect may be beneficial -- Follow up 1 month    ADDENDUM: Ferritin has increased  appropriately with iron tables, 24 -> 53 -> 119. Vitamin B12 is very high, >2,000. Unclear reason for elevated vitamin B12 as she is not on supplementation. Fortunately there is no toxicity or adverse affects associated with elevated B12. She is scheduled for ABIs on 11/5. Will follow up results. Attempted to call patient, left voicemail with results and plan.   ADDENDUM: ABIs are normal, 1.2 and 1.17. Called daughter and left VM on cell phone with results. Will move forward EMG testing, neurology referral placed.

## 2018-02-13 LAB — VITAMIN B12: Vitamin B-12: >2000 pg/mL — ABNORMAL HIGH (ref 232–1245)

## 2018-02-13 LAB — FERRITIN: Ferritin: 119 ng/mL (ref 15–150)

## 2018-02-13 NOTE — Progress Notes (Signed)
Internal Medicine Clinic Attending  Case discussed with Dr. Guilloud at the time of the visit.  We reviewed the resident's history and exam and pertinent patient test results.  I agree with the assessment, diagnosis, and plan of care documented in the resident's note.  

## 2018-02-14 ENCOUNTER — Telehealth: Payer: Self-pay

## 2018-02-14 DIAGNOSIS — E611 Iron deficiency: Secondary | ICD-10-CM

## 2018-02-14 DIAGNOSIS — D649 Anemia, unspecified: Secondary | ICD-10-CM

## 2018-02-14 MED ORDER — FERROUS SULFATE 325 (65 FE) MG PO TABS: 325.0000 mg | ORAL_TABLET | Freq: Every day | ORAL | 3 refills | Status: DC

## 2018-02-14 NOTE — Telephone Encounter (Signed)
Requesting lab result. Please call back. 

## 2018-02-14 NOTE — Telephone Encounter (Signed)
Spoke with daughter regarding lab results. Sent refill of iron tablets to her pharmacy.

## 2018-02-19 ENCOUNTER — Ambulatory Visit: Payer: Medicare Other

## 2018-02-20 ENCOUNTER — Ambulatory Visit (HOSPITAL_COMMUNITY)
Admission: RE | Admit: 2018-02-20 | Discharge: 2018-02-20 | Disposition: A | Discharge status: Home / Self Care | Payer: Medicare Other | Source: Ambulatory Visit | Attending: Internal Medicine | Admitting: Internal Medicine

## 2018-02-20 DIAGNOSIS — M79605 Pain in left leg: Secondary | ICD-10-CM | POA: Insufficient documentation

## 2018-02-20 DIAGNOSIS — M79604 Pain in right leg: Secondary | ICD-10-CM

## 2018-02-20 NOTE — Progress Notes (Signed)
VASCULAR LAB PRELIMINARY  ARTERIAL  ABI completed:    RIGHT    LEFT    PRESSURE WAVEFORM  PRESSURE WAVEFORM  BRACHIAL 151 Triphasic BRACHIAL 145  Triphasic  DP 181 Triphasic DP 177 Triphasic  PT 166 Biphasic PT 168 Biphasic    RIGHT LEFT  ABI 1.20 1.17   ABIs and Doppler waveforms are within normal limits bilaterally at rest.  Andre Swander, RVS 02/20/2018, 3:55 PM

## 2018-02-22 ENCOUNTER — Telehealth: Payer: Self-pay

## 2018-02-22 DIAGNOSIS — M79604 Pain in right leg: Secondary | ICD-10-CM

## 2018-02-22 DIAGNOSIS — M79605 Pain in left leg: Principal | ICD-10-CM

## 2018-02-22 NOTE — Telephone Encounter (Signed)
Pt's caregiver/ daughter calls and states gabapentin not working, wants a new one, please advise

## 2018-02-22 NOTE — Telephone Encounter (Signed)
Rtc, no answer, lm for rtc 

## 2018-02-22 NOTE — Addendum Note (Signed)
Addended by: Burnell Blanks on: 02/22/2018 01:23 PM   Modules accepted: Orders

## 2018-02-22 NOTE — Telephone Encounter (Signed)
Requesting to speak with a nurse about med. Please call back.  

## 2018-02-23 ENCOUNTER — Telehealth: Payer: Self-pay | Admitting: *Deleted

## 2018-02-23 MED ORDER — AMITRIPTYLINE HCL 25 MG PO TABS: 25.0000 mg | ORAL_TABLET | Freq: Every day | ORAL | 2 refills | Status: DC

## 2018-02-23 NOTE — Telephone Encounter (Signed)
Sent new prescription for amitriptyline to her pharmacy. Please tell her to take this once a day before bed and to stop taking gabapentin. Thank you!

## 2018-02-23 NOTE — Telephone Encounter (Signed)
[facility]-T pharm calls and wants to make sure md knows amitriptyline has geriatric precautions wants to make sure that was taken into consideration.

## 2018-02-23 NOTE — Telephone Encounter (Signed)
Yes I am aware. Thank you.

## 2018-02-26 ENCOUNTER — Telehealth: Payer: Self-pay | Admitting: *Deleted

## 2018-02-26 NOTE — Telephone Encounter (Signed)
We are tapering her off opioid pain medication. That is why I switched her from the fentanyl patches to hydrocodone. She will no longer be getting tramadol. If she would like to discuss in person please have her schedule an appointment.

## 2018-02-26 NOTE — Telephone Encounter (Signed)
Pt's daughter/ caregiver calls and states that pt and she would like pt to have tramadol for pain, nothing else works. She states please send today she hurts bad. Please advise [phone]

## 2018-02-27 NOTE — Telephone Encounter (Signed)
rtc to daughter lm for rtc

## 2018-02-28 NOTE — Telephone Encounter (Signed)
Spoke to daughter, scheduled appt at her choosing for 11/22, informed her still need to keep appt w/ dr Antony Contrasguilloud

## 2018-02-28 NOTE — Telephone Encounter (Signed)
Lm for rtc 

## 2018-03-07 ENCOUNTER — Telehealth: Payer: Self-pay | Admitting: *Deleted

## 2018-03-07 NOTE — Telephone Encounter (Signed)
LVM REGARDING FALL  PREVENTION BINGO CLASS 11-21-019 @10A .M. -12 NOON. ASKED PATIENT TO RETURN CALL TO LET US KNOW IF BE ABLE TO ATTEND.

## 2018-03-09 ENCOUNTER — Ambulatory Visit (INDEPENDENT_AMBULATORY_CARE_PROVIDER_SITE_OTHER): Payer: Medicare Other | Admitting: Internal Medicine

## 2018-03-09 ENCOUNTER — Encounter: Payer: Self-pay | Admitting: Internal Medicine

## 2018-03-09 VITALS — BP 137/63 | HR 65 | Temp 98.1°F | Wt 81.6 lb

## 2018-03-09 DIAGNOSIS — Z8711 Personal history of peptic ulcer disease: Secondary | ICD-10-CM

## 2018-03-09 DIAGNOSIS — M549 Dorsalgia, unspecified: Secondary | ICD-10-CM

## 2018-03-09 DIAGNOSIS — Z79891 Long term (current) use of opiate analgesic: Secondary | ICD-10-CM

## 2018-03-09 DIAGNOSIS — M79604 Pain in right leg: Secondary | ICD-10-CM

## 2018-03-09 DIAGNOSIS — G8929 Other chronic pain: Secondary | ICD-10-CM

## 2018-03-09 DIAGNOSIS — M79605 Pain in left leg: Secondary | ICD-10-CM

## 2018-03-09 DIAGNOSIS — F1190 Opioid use, unspecified, uncomplicated: Secondary | ICD-10-CM

## 2018-03-09 MED ORDER — TRAMADOL HCL 50 MG PO TABS: 50.0000 mg | ORAL_TABLET | Freq: Two times a day (BID) | ORAL | 0 refills | Status: DC

## 2018-03-09 NOTE — Assessment & Plan Note (Signed)
The patient reports neither gabapentin nor amitriptyline helped her leg pain. She is no longer taking either of these medications. After extensive counseling about the NCS/EMG procedure, they have agreed to give this a try to determine the cause of her pain.   Plan 1. NCS/EMG as scheduled on 12/12 2. Stop taking amitriptyline

## 2018-03-09 NOTE — Patient Instructions (Addendum)
1. ?????? ?? ??   2. ??? ?? ?? (??????)  3. ???? 50mg ? ??? ? ? ????  4. ?? ????? ??????. ??? ?? ???? ??? ?????.  5. 12/2? ???? ??? ?? ?? ??? ?????.  1. Stop taking amitriptyline   2. Stop taking Norco (hydrocodone)  3. Start taking tramadol 50mg  twice a day  4. I have made a referral to the pain specialist. They will call you with an appointment time.  5. Please follow-up with Dr. Antony ContrasGuilloud on 12/2.

## 2018-03-09 NOTE — Progress Notes (Signed)
   CC: chronic back and leg pain  HPI:   [name] is a 82 y.o. female with the medical conditions listed below who presents to the internal medicine clinic with chronic back and leg pain. The patient is accompanied by her daughter and a [place] interpreter was used to communicate with them. Please see problem based charting for the history and status of the patient's current and chronic medical conditions.   Past Medical History:  Diagnosis Date  . Allergy   . Chronic low back pain   . Compression fracture    T9 and T12 noted 05/2012 imaging. Followed by pain clinic   . Fall    Jan or Feb 2014   . Gastritis   . GERD (gastroesophageal reflux disease)   . Insomnia   . Irregular heart rate   . Osteoporosis   . Peptic ulcer disease   . Rib pain   . Right wrist fracture    3 - 4 monthes ago    Review of Systems:   Pertinent positives mentioned in HPI. Remainder of all ROS negative.  Physical Exam: Vitals:   03/09/18 1528  BP: 137/63  Pulse: 65  Temp: 98.1 F (36.7 C)  TempSrc: Oral  SpO2: 99%  Weight: 81 lb 9.6 oz (37 kg)   Physical Exam  Constitutional: Thin and frail. Cardiovascular: Normal rate and regular rhythm. No murmurs, rubs, or gallops. Pulmonary/Chest: Effort normal. Clear to auscultation bilaterally. No wheezes, rales, or rhonchi. Back: Kyphoscoliosis. Right lumbar paravertebral tenderness to palpation.  Ext: No lower extremity edema. Skin: Warm and dry. No rashes or wounds.   Assessment & Plan:   See Encounters Tab for problem based charting.  Patient seen with Dr. Oswaldo DoneVincent

## 2018-03-09 NOTE — Assessment & Plan Note (Signed)
The patient reports that she has stopped taking the hydrocodone because it hasn't been working. Instead, she is taking Excedrin despite being advised against this due to her PUD. They report that tramadol is the only medication that works and would like to restart that.   Plan 1. Discontinue Norco 2. Start tramadol 50mg  BID (#30, no refills). Patient has tolerated this well in the past.  3. Patient has appointment scheduled with her PCP Dr. Antony ContrasGuilloud on on 12/2. The tramadol prescription should last her until that appointment.  4. Referral made to pain management as requested by patient and daughter.

## 2018-03-10 ENCOUNTER — Other Ambulatory Visit: Payer: Self-pay | Admitting: Internal Medicine

## 2018-03-10 DIAGNOSIS — M79605 Pain in left leg: Principal | ICD-10-CM

## 2018-03-10 DIAGNOSIS — M79604 Pain in right leg: Secondary | ICD-10-CM

## 2018-03-12 NOTE — Progress Notes (Signed)
Internal Medicine Clinic Attending  I saw and evaluated the patient.  I personally confirmed the key portions of the history and exam documented by Dr. Dorrell and I reviewed pertinent patient test results.  The assessment, diagnosis, and plan were formulated together and I agree with the documentation in the resident's note. 

## 2018-03-19 ENCOUNTER — Encounter: Payer: Medicare Other | Admitting: Internal Medicine

## 2018-03-28 ENCOUNTER — Other Ambulatory Visit: Payer: Self-pay

## 2018-03-28 NOTE — Telephone Encounter (Signed)
Last Tramadol refilled 11/22 #30.

## 2018-03-28 NOTE — Telephone Encounter (Signed)
Tramadol refill declined. She was given a one time short supply to last her until her appointment with me on 12/2, which she canceled.

## 2018-03-28 NOTE — Telephone Encounter (Signed)
traMADol (ULTRAM) 50 MG tablet   Mark as Reviewed   [facility] [place] [place]

## 2018-03-29 ENCOUNTER — Ambulatory Visit (INDEPENDENT_AMBULATORY_CARE_PROVIDER_SITE_OTHER): Payer: Medicare Other | Admitting: Neurology

## 2018-03-29 ENCOUNTER — Ambulatory Visit (INDEPENDENT_AMBULATORY_CARE_PROVIDER_SITE_OTHER): Payer: Self-pay | Admitting: Neurology

## 2018-03-29 ENCOUNTER — Telehealth: Payer: Self-pay | Admitting: Internal Medicine

## 2018-03-29 DIAGNOSIS — M79605 Pain in left leg: Secondary | ICD-10-CM

## 2018-03-29 DIAGNOSIS — M79604 Pain in right leg: Secondary | ICD-10-CM

## 2018-03-29 DIAGNOSIS — Z0289 Encounter for other administrative examinations: Secondary | ICD-10-CM

## 2018-03-29 DIAGNOSIS — M79606 Pain in leg, unspecified: Secondary | ICD-10-CM

## 2018-03-29 NOTE — Progress Notes (Signed)
Patient did not tolerate the procedure

## 2018-03-29 NOTE — Telephone Encounter (Signed)
Pt is calling regarding tradmol refill, pls contact pt

## 2018-03-29 NOTE — Telephone Encounter (Signed)
Spoke with patient via [employer]preter, [place], [place] [phone]. Explained that refill was declined by PCP as pt was given a short term Rx to get to 03/19/2018 appt which patient then cancelled. Referral was made to Dr. Ewing SchleinGregory Crisp for pain management on 03/09/2018. Patient has not heard from that office. Provided Dr. Letta Moynahanrisp's phone number [phone]) to pt. Daughter asked to make appt with PCP. No available appts. [facility] per daughter's request. Kinnie FeilL. Macallister Ashmead, RN, BSN

## 2018-03-29 NOTE — Progress Notes (Signed)
Patient did not tolerate the procedure 

## 2018-03-29 NOTE — Procedures (Signed)
Attempted EMG/NCS. Patient did not tolerate the procedure.

## 2018-04-03 NOTE — Addendum Note (Signed)
Addended by: Bufford SpikesFULCHER, Lakynn Halvorsen N on: 04/03/2018 04:54 PM   Modules accepted: Orders

## 2018-04-09 ENCOUNTER — Other Ambulatory Visit: Payer: Self-pay | Admitting: Internal Medicine

## 2018-04-09 DIAGNOSIS — M79604 Pain in right leg: Secondary | ICD-10-CM

## 2018-04-09 DIAGNOSIS — M79605 Pain in left leg: Principal | ICD-10-CM

## 2018-04-12 ENCOUNTER — Other Ambulatory Visit: Payer: Self-pay | Admitting: Internal Medicine

## 2018-04-12 DIAGNOSIS — M79604 Pain in right leg: Secondary | ICD-10-CM

## 2018-04-12 DIAGNOSIS — M79605 Pain in left leg: Principal | ICD-10-CM

## 2018-04-13 ENCOUNTER — Other Ambulatory Visit: Payer: Self-pay

## 2018-04-13 ENCOUNTER — Encounter (INDEPENDENT_AMBULATORY_CARE_PROVIDER_SITE_OTHER): Payer: Self-pay

## 2018-04-13 ENCOUNTER — Ambulatory Visit (INDEPENDENT_AMBULATORY_CARE_PROVIDER_SITE_OTHER): Payer: Medicare Other | Admitting: Internal Medicine

## 2018-04-13 VITALS — BP 149/74 | HR 80 | Temp 98.3°F | Ht 59.5 in | Wt 83.9 lb

## 2018-04-13 DIAGNOSIS — R319 Hematuria, unspecified: Secondary | ICD-10-CM

## 2018-04-13 DIAGNOSIS — R3911 Hesitancy of micturition: Secondary | ICD-10-CM | POA: Diagnosis not present

## 2018-04-13 DIAGNOSIS — M79604 Pain in right leg: Secondary | ICD-10-CM | POA: Diagnosis not present

## 2018-04-13 DIAGNOSIS — F1190 Opioid use, unspecified, uncomplicated: Secondary | ICD-10-CM

## 2018-04-13 DIAGNOSIS — R300 Dysuria: Secondary | ICD-10-CM

## 2018-04-13 DIAGNOSIS — Z79891 Long term (current) use of opiate analgesic: Secondary | ICD-10-CM

## 2018-04-13 DIAGNOSIS — Z79899 Other long term (current) drug therapy: Secondary | ICD-10-CM

## 2018-04-13 DIAGNOSIS — M79605 Pain in left leg: Secondary | ICD-10-CM | POA: Diagnosis not present

## 2018-04-13 DIAGNOSIS — R350 Frequency of micturition: Secondary | ICD-10-CM

## 2018-04-13 LAB — POCT URINALYSIS DIPSTICK
Bilirubin, UA: NEGATIVE
Glucose, UA: NEGATIVE
Ketones, UA: NEGATIVE
Leukocytes, UA: NEGATIVE
Nitrite, UA: NEGATIVE
PROTEIN UA: NEGATIVE
Spec Grav, UA: <=1.005 — AB (ref 1.010–1.025)
Urobilinogen, UA: 0.2 E.U./dL
pH, UA: 6 (ref 5.0–8.0)

## 2018-04-13 MED ORDER — AMITRIPTYLINE HCL 25 MG PO TABS: 25.0000 mg | ORAL_TABLET | Freq: Every day | ORAL | 1 refills | Status: DC

## 2018-04-13 MED ORDER — TRAMADOL HCL 50 MG PO TABS: 50.0000 mg | ORAL_TABLET | Freq: Two times a day (BID) | ORAL | 0 refills | Status: AC

## 2018-04-13 NOTE — Progress Notes (Addendum)
CC: follow up of bilateral lower extremity pain   HPI:  [name] is a 82 y.o. with PMH as listed below who presents for follow up of bilateral lower extremity pain . Please see the assessment and plans for the status of the patient chronic medical problems.    Past Medical History:  Diagnosis Date  . Allergy   . Chronic low back pain   . Compression fracture    T9 and T12 noted 05/2012 imaging. Followed by pain clinic   . Fall    Jan or Feb 2014   . Gastritis   . GERD (gastroesophageal reflux disease)   . Insomnia   . Irregular heart rate   . Osteoporosis   . Peptic ulcer disease   . Rib pain   . Right wrist fracture    3 - 4 monthes ago   Review of Systems:  Refer to history of present illness and assessment and plans for pertinent review of systems, all others reviewed and negative  Physical Exam:  Vitals:   04/13/18 1419  BP: (!) 149/74  Pulse: 80  Temp: 98.3 F (36.8 C)  TempSrc: Oral  SpO2: 98%  Weight: 83 lb 14.4 oz (38.1 kg)  Height: 4' 11.5" (1.511 m)   General: no acute distress  Cardiac: regular rate and rhythm, no murmur  MSK: there thoracic spine is kyphotic, she is wearing an abdominal binder, no spinal or paraspinal tenderness  Neuro: sensation over the lower extremities is intact, strength 5/5 in proximal and distal lower extremity muscles, bilateral 1+ patellar hyporeflexia  Assessment & Plan:   Bilateral leg pain  Patient is here for follow up of bilateral lower extremity pain. The pain is burning like " when your feet fall asleep" and mostly radiates from her knees to her calfs but is sometimes associated with lower back pain and radiates all of the way down. Pain is somewhat worse in her right than left leg.The pain is generally constant, she stresses that the pain is worse when she is not taking the tramadol and amitriptyline. It is worsened by walking and at night. She has had workup for vascular etiology with ABIs which were normal. She  attempted an EEG/ nerve conduction study earlier this month but was not able to tolerate it. Her granddaughter is with her today, Ms. Allison Rojas is requesting a refill of the tramadol and also the amitriptyline, she acknowledges that at last visit she said the amitriptyline wasn't working but when the tried it in combination with the tramadol she was able to sleep through the night. Prior MRI lumbar spine from 2012 was reviewed and showed multilevel disk degeneration with osteophytes intervertebral stenosis. Today her family ask if there is an alternative to pain medication prescriptions, it appears that she has not tried epidural injection in the past. Her family also notes that she has been taking Excedrin. - obtain follow up MRI of the lumbar spine with likely referral to IR if there is significant stenosis.  - refilled amitriptyline  - refilled tramadol 50 mg BID, 45 days worth to last her to her next PCP visit. I explained that she should not take more than the prescribed amount.  - advised to stop excedrine and use tylenol up to 5000 mg daily instead  Heme positive urinalysis  Patient describes one week of dysuria, urinary frequency and hesitancy. Urine dipstick was not consistent with infection but showed moderate blood, her granddaughter notes that Ms. Allison Rojas took amoxicillin for about 3  days after her symptoms started and had some relief with taking this. The [place] was from [place]. The antibiotics could very well lead to the non infected appearing UA. On review of the chart she had moderate hemoglobin on a urine dipstick last obtained in 2014, urinalysis confirmed 3-6 RBC per high power field.  - will obtain complete urinalysis and urine culture, these could be falsely negative given her recent use of amoxicillin and she should have repeat urinalysis at follow up   ADDENDUM: 1/2: urinalysis showed 3-10 RBC per high power field, there was no bacteria and urine culture is negative.  Results are likely falsely negative for infection given the use of amoxicillin prior to urine testing. Would plan for follow up Urinalysis in one month to document resolution of infection. I spoke with Melvern Sampleachel Riccobono, the patietns daughter over the phone. She says the patients symptoms of urinary tract infection have resolved. She notes that Ms. Allison Rojas has continued to take the amoxicillin obtained from over the [place]. I encouraged her to stop taking the amoxicillin and asked that she return in one month for follow up UA which will hopefully document resolution of the urinary tract infection.   See Encounters Tab for problem based charting.  Patient discussed with Dr. Cleda DaubE. Hoffman

## 2018-04-13 NOTE — Patient Instructions (Addendum)
Thank you for coming to the clinic today. It was a pleasure to see you.   For your back pain - I have refilled your tramadol and amitriptyline prescriptions, you'll have to keep your appointment with Dr. Cleon GustinGuillioud to get your next set of refills for these. You should not take the tramadol more than twice daily.   I have ordered a follow up MRI of your back so that we can talk about the possibility of getting an injection there.   FOLLOW-UP INSTRUCTIONS When: February 10 with Dr. Antony ContrasGuilloud  For: follow up of your back pain  What to bring: all of your medication bottles   Please call the internal medicine center clinic if you have any questions or concerns, we may be able to help and keep you from a long and expensive emergency room wait. Our clinic and after hours phone number is [phone], the best time to call is Monday through Friday 9 am to 4 pm but there is always someone available 24/7 if you have an emergency. If you need medication refills please notify your pharmacy one week in advance and they will send us a request.

## 2018-04-13 NOTE — Assessment & Plan Note (Signed)
Patient is here for follow up of bilateral lower extremity pain. The pain is burning like " when your feet fall asleep" and mostly radiates from her knees to her calfs but is sometimes associated with lower back pain and radiates all of the way down. Pain is somewhat worse in her right than left leg.The pain is generally constant, she stresses that the pain is worse when she is not taking the tramadol and amitriptyline. It is worsened by walking and at night. She has had workup for vascular etiology with ABIs which were normal. She attempted an EEG/ nerve conduction study earlier this month but was not able to tolerate it. Her granddaughter is with her today, Ms. Nedra HaiLee is requesting a refill of the tramadol and also the amitriptyline, she acknowledges that at last visit she said the amitriptyline wasn't working but when the tried it in combination with the tramadol she was able to sleep through the night. Prior MRI lumbar spine from 2012 was reviewed and showed multilevel disk degeneration with osteophytes intervertebral stenosis. Today her family ask if there is an alternative to pain medication prescriptions, it appears that she has not tried epidural injection in the past. Her family also notes that she has been taking Excedrin. - obtain follow up MRI of the lumbar spine with likely referral to IR if there is significant stenosis.  - refilled amitriptyline  - refilled tramadol 50 mg BID, 45 days worth to last her to her next PCP visit. I explained that she should not take more than the prescribed amount.  - advised to stop excedrine and use tylenol up to 5000 mg daily instead

## 2018-04-13 NOTE — Assessment & Plan Note (Addendum)
Patient describes one week of dysuria, urinary frequency and hesitancy. Urine dipstick was not consistent with infection but showed moderate blood, her granddaughter notes that Ms. Allison Rojas took amoxicillin for about 3 days after her symptoms started and had some relief with taking this. The amoxicillin was from over [place]. The antibiotics could very well lead to the non infected appearing UA. On review of the chart she had moderate hemoglobin on a urine dipstick last obtained in 2014, urinalysis confirmed 3-6 RBC per high power field.  - will obtain complete urinalysis and urine culture, these could be falsely negative given her recent use of amoxicillin and she should have repeat urinalysis at follow up   ADDENDUM: 1/2: urinalysis showed 3-10 RBC per high power field, there was no bacteria and urine culture is negative. Results are likely falsely negative for infection given the use of amoxicillin prior to urine testing. Would plan for follow up Urinalysis in one month to document resolution of infection. I spoke with Allison Rojas, the patietns daughter over the phone. She says the patients symptoms of urinary tract infection have resolved. She notes that Ms. Allison Rojas has continued to take the amoxicillin obtained from over the [place]. I encouraged her to stop taking the amoxicillin and asked that she return in one month for follow up UA which will hopefully document resolution of the urinary tract infection.

## 2018-04-14 LAB — MICROSCOPIC EXAMINATION
Bacteria, UA: NONE SEEN
Casts: NONE SEEN /lpf
Epithelial Cells (non renal): NONE SEEN /hpf (ref 0–10)

## 2018-04-14 LAB — URINALYSIS, COMPLETE
Bilirubin, UA: NEGATIVE
Glucose, UA: NEGATIVE
Ketones, UA: NEGATIVE
Leukocytes, UA: NEGATIVE
Nitrite, UA: NEGATIVE
Protein, UA: NEGATIVE
Specific Gravity, UA: 1.006 (ref 1.005–1.030)
Urobilinogen, Ur: 0.2 mg/dL (ref 0.2–1.0)
pH, UA: 6 (ref 5.0–7.5)

## 2018-04-14 LAB — SPECIMEN STATUS REPORT

## 2018-04-15 LAB — URINE CULTURE: ORGANISM ID, BACTERIA: NO GROWTH

## 2018-04-16 NOTE — Telephone Encounter (Signed)
Refill for gabapentin declined. This medication was previously discontinued as patient said it was ineffective. She was just seen on 12/27 and back pain was controlled at that time on amitriptyline and tramadol. She needs to be seen if she requires a change in her regimen. Thanks.

## 2018-04-17 LAB — TOXASSURE SELECT,+ANTIDEPR,UR

## 2018-04-19 ENCOUNTER — Telehealth: Payer: Self-pay

## 2018-04-19 NOTE — Telephone Encounter (Signed)
Pt's g daughter called, [place] [phone]) - stated pharmacy having difficulty refilling Tramadol rx d/t DEA #.  I [facility]; verified Dr Tamela Oddi DEA # ; ran rx back thru the system and it went thru.  I called Kayal Stebleton back ; informed she may pick up rx later today. She asked about urine specimen result - I will send request to Dr Obie Dredge.

## 2018-04-19 NOTE — Telephone Encounter (Signed)
I spoke with Allison Rojas. Please see the details in my office visit note.

## 2018-04-19 NOTE — Telephone Encounter (Signed)
Allison Rojas with [facility] needs to speak with a nurse about doctor DEA number.

## 2018-04-26 NOTE — Progress Notes (Signed)
Internal Medicine Clinic Attending  Case discussed with Dr. Blum at the time of the visit.  We reviewed the resident's history and exam and pertinent patient test results.  I agree with the assessment, diagnosis, and plan of care documented in the resident's note. 

## 2018-05-07 ENCOUNTER — Other Ambulatory Visit: Payer: Self-pay | Admitting: Internal Medicine

## 2018-05-07 DIAGNOSIS — M79605 Pain in left leg: Principal | ICD-10-CM

## 2018-05-07 DIAGNOSIS — M79604 Pain in right leg: Secondary | ICD-10-CM

## 2018-05-08 NOTE — Telephone Encounter (Signed)
Refill for gabapentin is again declined. Unclear what patient is actually taking for her leg pain as she continues to request refills of previously discontinued medications. Per last office note, pain was controlled on amitriptyline and tramadol. Please tell patient to schedule an appointment to discuss and not to mix old prescription medications.

## 2018-05-13 ENCOUNTER — Other Ambulatory Visit: Payer: Self-pay | Admitting: Internal Medicine

## 2018-05-13 DIAGNOSIS — M79605 Pain in left leg: Principal | ICD-10-CM

## 2018-05-13 DIAGNOSIS — M79604 Pain in right leg: Secondary | ICD-10-CM

## 2018-05-21 NOTE — Telephone Encounter (Signed)
Has appt scheduled for 2/17.Allison Rojas, Allison Martel Cassady2/3/20201:39 PM

## 2018-05-22 ENCOUNTER — Ambulatory Visit: Payer: Medicare Other | Admitting: Pharmacist

## 2018-05-23 ENCOUNTER — Other Ambulatory Visit: Payer: Self-pay | Admitting: Internal Medicine

## 2018-05-23 ENCOUNTER — Telehealth: Payer: Self-pay | Admitting: *Deleted

## 2018-05-23 NOTE — Telephone Encounter (Signed)
Next appt scheduled 2/17 with PCP. 

## 2018-05-23 NOTE — Telephone Encounter (Signed)
SPOKE WITH DAUGHTER (MOM SPEAKS NO ENGLISH) . PAIN CLINIC APPOINTMENT CANCELED, PER OFFICE. IF PATIENT CONTINUE TO CANCEL APPOINTMENTS OFFICE WILL NOT RESCHEDULE. PATIENT HAS APPOINTMENT 06-04-2018. DAUGHTER WILL TALK WITH HER PCP TO SEE IF MOTHER NEEDS PAIN CLINIC APPOINTMENT.

## 2018-06-01 ENCOUNTER — Other Ambulatory Visit: Payer: Self-pay | Admitting: Internal Medicine

## 2018-06-01 DIAGNOSIS — M79605 Pain in left leg: Principal | ICD-10-CM

## 2018-06-01 DIAGNOSIS — M79604 Pain in right leg: Secondary | ICD-10-CM

## 2018-06-04 ENCOUNTER — Other Ambulatory Visit: Payer: Self-pay

## 2018-06-04 ENCOUNTER — Ambulatory Visit: Payer: Medicare Other | Admitting: Pharmacist

## 2018-06-04 ENCOUNTER — Ambulatory Visit (INDEPENDENT_AMBULATORY_CARE_PROVIDER_SITE_OTHER): Payer: Medicare Other | Admitting: Internal Medicine

## 2018-06-04 ENCOUNTER — Encounter: Payer: Self-pay | Admitting: Internal Medicine

## 2018-06-04 VITALS — BP 125/65 | HR 82 | Temp 98.4°F | Ht 59.5 in | Wt 83.8 lb

## 2018-06-04 DIAGNOSIS — M549 Dorsalgia, unspecified: Secondary | ICD-10-CM | POA: Diagnosis not present

## 2018-06-04 DIAGNOSIS — Z681 Body mass index (BMI) 19 or less, adult: Secondary | ICD-10-CM

## 2018-06-04 DIAGNOSIS — Z131 Encounter for screening for diabetes mellitus: Secondary | ICD-10-CM

## 2018-06-04 DIAGNOSIS — Z79891 Long term (current) use of opiate analgesic: Secondary | ICD-10-CM | POA: Diagnosis not present

## 2018-06-04 DIAGNOSIS — R319 Hematuria, unspecified: Secondary | ICD-10-CM | POA: Diagnosis not present

## 2018-06-04 DIAGNOSIS — M79605 Pain in left leg: Secondary | ICD-10-CM | POA: Diagnosis not present

## 2018-06-04 DIAGNOSIS — R350 Frequency of micturition: Secondary | ICD-10-CM

## 2018-06-04 DIAGNOSIS — R739 Hyperglycemia, unspecified: Secondary | ICD-10-CM

## 2018-06-04 DIAGNOSIS — Z87311 Personal history of (healed) other pathological fracture: Secondary | ICD-10-CM | POA: Diagnosis not present

## 2018-06-04 DIAGNOSIS — M79604 Pain in right leg: Secondary | ICD-10-CM | POA: Diagnosis not present

## 2018-06-04 DIAGNOSIS — R3129 Other microscopic hematuria: Secondary | ICD-10-CM | POA: Diagnosis not present

## 2018-06-04 DIAGNOSIS — M40204 Unspecified kyphosis, thoracic region: Secondary | ICD-10-CM | POA: Diagnosis not present

## 2018-06-04 DIAGNOSIS — R300 Dysuria: Secondary | ICD-10-CM | POA: Insufficient documentation

## 2018-06-04 DIAGNOSIS — Z79899 Other long term (current) drug therapy: Secondary | ICD-10-CM

## 2018-06-04 DIAGNOSIS — G8929 Other chronic pain: Secondary | ICD-10-CM

## 2018-06-04 DIAGNOSIS — E46 Unspecified protein-calorie malnutrition: Secondary | ICD-10-CM | POA: Diagnosis not present

## 2018-06-04 DIAGNOSIS — S22080S Wedge compression fracture of T11-T12 vertebra, sequela: Secondary | ICD-10-CM

## 2018-06-04 DIAGNOSIS — F1190 Opioid use, unspecified, uncomplicated: Secondary | ICD-10-CM

## 2018-06-04 DIAGNOSIS — M810 Age-related osteoporosis without current pathological fracture: Secondary | ICD-10-CM

## 2018-06-04 LAB — POCT GLYCOSYLATED HEMOGLOBIN (HGB A1C): Hemoglobin A1C: 5.2 % (ref 4.0–5.6)

## 2018-06-04 LAB — POCT URINALYSIS DIPSTICK
Bilirubin, UA: NEGATIVE
Glucose, UA: NEGATIVE
Ketones, UA: NEGATIVE
Leukocytes, UA: NEGATIVE
Nitrite, UA: NEGATIVE
Protein, UA: NEGATIVE
Spec Grav, UA: 1.015
Urobilinogen, UA: 0.2 U/dL
pH, UA: 7

## 2018-06-04 LAB — GLUCOSE, CAPILLARY: Glucose-Capillary: 92 mg/dL (ref 70–99)

## 2018-06-04 MED ORDER — TRAMADOL HCL 50 MG PO TABS: 50.0000 mg | ORAL_TABLET | Freq: Two times a day (BID) | ORAL | 0 refills | Status: DC | PRN

## 2018-06-04 MED ORDER — AMITRIPTYLINE HCL 50 MG PO TABS: 50.0000 mg | ORAL_TABLET | Freq: Every day | ORAL | 2 refills | Status: DC

## 2018-06-04 MED ORDER — PROLIA 60 MG/ML ~~LOC~~ SOSY: 60.0000 mg | PREFILLED_SYRINGE | SUBCUTANEOUS | 0 refills | Status: DC

## 2018-06-04 MED FILL — PROLIA 60 MG/ML SOLN: 60 | 180 days supply | Qty: 1 | Fill #0

## 2018-06-04 NOTE — Patient Instructions (Addendum)
Allison Rojas,  It was a pleasure to see you. I have sent refills of your tramadol to your pharmacy. I have increased the dose of your amitriptyline to 50 mg nightly.   I have referred you to urology for both the urinary frequency and the blood in your urine. You will be called to schedule an appointment.   Please follow up with me again in 3 months. If you have any questions or concerns, call our clinic at [phone] or after hours call [phone] and ask for the internal medicine resident on call. Thank you!  Dr. Antony Contras

## 2018-06-04 NOTE — Progress Notes (Signed)
   CC: Chronic pain  HPI:  [name] is a 83 y.o. female with past medical history outlined below here for chronic pain follow up. For the details of today's visit, please refer to the assessment and plan.   Past Medical History:  Diagnosis Date  . Allergy   . Chronic low back pain   . Compression fracture    T9 and T12 noted 05/2012 imaging. Followed by pain clinic   . Fall    Jan or Feb 2014   . Gastritis   . GERD (gastroesophageal reflux disease)   . Insomnia   . Irregular heart rate   . Osteoporosis   . Peptic ulcer disease   . Rib pain   . Right wrist fracture    3 - 4 monthes ago    Review of Systems  Musculoskeletal: Positive for back pain, joint pain and myalgias.    Physical Exam:  Vitals:   06/04/18 1406  BP: 125/65  Pulse: 82  Temp: 98.4 F (36.9 C)  TempSrc: Oral  SpO2: 99%  Weight: 83 lb 12.8 oz (38 kg)  Height: 4' 11.5" (1.511 m)    Constitutional: NAD, appears comfortable. Thin, cachetic appearing Cardiovascular:RRR, no murmurs, rubs, or gallops.  Pulmonary/Chest:CTAB, no wheezes, rales, or rhonchi.  Extremities: Warm and well perfused. Distal pulses intact. No edema.  Neurological:A&Ox3, CN II - XII grossly intact. Severe thoracic kyphosis   Assessment & Plan:   See Encounters Tab for problem based charting.  Patient discussed with Dr. Cleda Daub

## 2018-06-05 ENCOUNTER — Encounter: Payer: Self-pay | Admitting: Internal Medicine

## 2018-06-05 NOTE — Assessment & Plan Note (Signed)
Patient is here for chronic back pain and bilateral lower extremity pain that I suspect is radiculopathy from spinal stenosis.  She has a history of prior compression fractures with severe kyphosis on exam.  Patient has been on chronic opioids now for a few years, unfortunately achieving an effective pain regimen has proven difficult. She has required multiple medications changes over the past year (Tramadol, hydrocodone, and fentanyl patch). Despite trying different medications and different dosing, we have been unable to achieve an effective regimen. Prior UDSs is have been inappropriate with patient mixing old opioid pain medication prescriptions, and not taking medications as prescribed.  She was given multiple second chances and the decision was made to taper her off opioids at her last appointment with me. Unfortunately there is a significant language barrier despite the use of an Science writer at each visit.  She called complaining of worsening pain while on her taper and requested to change back to tramadol.  Unfortunately my schedule was full and she was unable to get an appointment with me until today.  I had her follow-up in the acute care clinic. In the meantime, she was restarted on tramadol and amitriptyline. Today, she reports improvement in her pain with amitriptyline which also helps her sleep at night. She is taking 50 mg of tramadol twice a day as prescribed, but is requesting an increase to TID as she sometimes requires the medications more frequently.   Today we had a very long conversation regarding pain control, expectations, and her previously inappropriate tox screens. Informed patient that we will not be escalating her regimen further, given that increasing her regimen in the past was ineffective, and each visit she continued to request higher doses despite prior escalation. I attempted to explain opioid induced hyperalgesia, and that her chronic use of these medications with  continuously higher doses is likely increasing her pain threshold, making it very difficult to achieve control. I did my best to set expectations that we will never be able to get rid of her pain completely, and the goal for pain control is simply to improve her function throughout the day. Informed patient that I will give her one final chance to continue with tramadol, if she is able to abide by our pain contract, which I went over in detail with her today. She is aware that she is not to take old opioid pain medication prescriptions and that if her UDS is inappropriate at follow up then I will be terminating the contract. She is aware of the safety concerns with mixing the medications and is willing to take her prescriptions only as prescribed. Her pain contract was renewed with me today. She will receive tramadol 50 mg BID (#60 a month). We will not be escalating dosing from here. I will increase her amitriptyline for better control. She was previously referred to a pain clinic but patient's daughter canceled the appoitnment. This was not addressed today. However is pain control continues to be difficult or she is unable to follow out pain contract, will request patient reschedule to establish. -- Increase amitriptyline 50 mg QHS -- Renewed pain contract today  -- Continue tramadol 50 mg BID (#60), x 2 refills  -- UDS at follow up

## 2018-06-05 NOTE — Assessment & Plan Note (Signed)
Hemoglobin A1c today is 5.2.  Patient's daughter expressed concern about her frequently eating sugary foods.  Reassured daughter that patient does not have diabetes and given her malnutrition, patient should not have any restrictions on her diet.

## 2018-06-05 NOTE — Assessment & Plan Note (Signed)
Patient is complaining of persistent dysuria since her last visit in December.  She reports pain with urination and a feeling of incomplete bladder emptying.  Urinalysis at her last visit was notable for microscopic hematuria, but no evidence of infection.  Repeat urinalysis today appears the same.  She does report taking amoxicillin intermittently from a [place] pharmacy.  She began taking it December then stop when her symptoms improved.  Then restarted again recently.  Advised patient to discontinue amoxicillin.  Given her microscopic hematuria, will refer to urology for further work-up. --Urology referral -Stop amoxicillin

## 2018-06-05 NOTE — Progress Notes (Signed)
S: Allison Rojas is a 83 y.o. female reports to clinical pharmacist appointment for osteoporosis medication management.  Allergies  Allergen Reactions  . Amitriptyline    Medication Sig  acetaminophen (TYLENOL) 500 MG tablet Take 1 tablet (500 mg total) by mouth 3 (three) times daily.  amitriptyline (ELAVIL) 50 MG tablet Take 1 tablet (50 mg total) by mouth at bedtime.  Ascorbic Acid (VITAMIN C) 100 MG CHEW Chew 1 tablet (100 mg total) by mouth daily.  calcium-vitamin D (OSCAL WITH D) 500-200 MG-UNIT tablet Take 1 tablet by mouth 2 (two) times daily.  diclofenac sodium (VOLTAREN) 1 % GEL Apply 4 g topically 4 (four) times daily.  ferrous sulfate 325 (65 FE) MG tablet Take 1 tablet (325 mg total) by mouth daily with breakfast.  fluticasone (FLONASE) 50 MCG/ACT nasal spray Place 2 sprays into both nostrils daily as needed for rhinitis. For allergies  PROLIA 60 MG/ML SOSY injection Inject 60 mg into the skin every 6 (six) months.  ranitidine (ZANTAC) 150 MG tablet Take 1 tablet (150 mg total) by mouth 2 (two) times daily.  ranitidine (ZANTAC) 150 MG tablet TAKE ONE TABLET BY MOUTH TWICE A DAY AS NEEDED FOR HEARTBURN  traMADol (ULTRAM) 50 MG tablet Take 1 tablet (50 mg total) by mouth 2 (two) times daily as needed.   Past Medical History:  Diagnosis Date  . Allergy   . Chronic low back pain   . Compression fracture    T9 and T12 noted 05/2012 imaging. Followed by pain clinic   . Fall    Jan or Feb 2014   . Gastritis   . GERD (gastroesophageal reflux disease)   . Insomnia   . Irregular heart rate   . Osteoporosis   . Peptic ulcer disease   . Rib pain   . Right wrist fracture    3 - 4 monthes ago   Social History   Socioeconomic History  . Marital status: Widowed    Spouse name: Not on file  . Number of children: 1  . Years of education: Not on file  . Highest education level: Not on file  Occupational History  . Not on file  Social Needs  . Financial resource strain: Not on  file  . Food insecurity:    Worry: Not on file    Inability: Not on file  . Transportation needs:    Medical: Not on file    Non-medical: Not on file  Tobacco Use  . Smoking status: Never Smoker  . Smokeless tobacco: Never Used  Substance and Sexual Activity  . Alcohol use: No  . Drug use: No  . Sexual activity: Never  Lifestyle  . Physical activity:    Days per week: Not on file    Minutes per session: Not on file  . Stress: Not on file  Relationships  . Social connections:    Talks on phone: Not on file    Gets together: Not on file    Attends religious service: Not on file    Active member of club or organization: Not on file    Attends meetings of clubs or organizations: Not on file    Relationship status: Not on file  Other Topics Concern  . Not on file  Social History Narrative   Financial assistance approved for 100% discount at [facility] and has [facility] card per [name]   11/30/2009   She is widowed, she has 2 children (only admits to 1 daughter now 07/2012), she  does not smoke cigarettes or drink alcohol   Lives with daughter Triva Kristof [phone]               Family History  Problem Relation Age of Onset  . Colon cancer Neg Hx   . Esophageal cancer Neg Hx   . Rectal cancer Neg Hx   . Stomach cancer Neg Hx    O:    Component Value Date/Time   CHOL 142 07/24/2013 1021   HDL 51 07/24/2013 1021   LDLCALC 77 07/24/2013 1021   TRIG 71 07/24/2013 1021   GLUCOSE 102 (H) 01/10/2018 1057   HGBA1C 5.2 06/04/2018 1617   NA 140 01/10/2018 1057   NA 142 06/05/2017 1652   K 4.5 01/10/2018 1057   CL 104 01/10/2018 1057   CO2 30 01/10/2018 1057   BUN 21 01/10/2018 1057   BUN 21 06/05/2017 1652   CREATININE 1.12 01/10/2018 1057   CREATININE 0.98 07/24/2013 1021   CALCIUM 9.7 01/10/2018 1057   GFRNONAA 48 (L) 06/05/2017 1652   GFRNONAA 57 (L) 07/24/2013 1021   GFRAA 55 (L) 06/05/2017 1652   GFRAA 65 07/24/2013 1021   AST 17 01/10/2018 1057   ALT 11 01/10/2018  1057   WBC 4.9 01/10/2018 1057   HGB 11.0 (L) 01/10/2018 1057   HGB 10.8 (L) 06/05/2017 1652   HCT 33.0 (L) 01/10/2018 1057   HCT 32.9 (L) 06/05/2017 1652   PLT 166.0 01/10/2018 1057   PLT 250 06/05/2017 1652   TSH 2.001 08/10/2012 2157   Ht Readings from Last 2 Encounters:  06/04/18 4' 11.5" (1.511 m)  04/13/18 4' 11.5" (1.511 m)   Wt Readings from Last 2 Encounters:  06/04/18 83 lb 12.8 oz (38 kg)  04/13/18 83 lb 14.4 oz (38.1 kg)   There is no height or weight on file to calculate BMI. BP Readings from Last 3 Encounters:  06/04/18 125/65  04/13/18 (!) 149/74  03/09/18 137/63    A/P: Patient was seen today in a co-visit with Dr. Antony Contras. History of osteoporosis (right femur T-score 01/24/2011 of -3.6). She was previously on alendronate for 5 years. Patient was unable to tolerate alendronate due to gastritis/GERD. She was transitioned to denosumab, first dose in June of 2018.   -- Continue denosumab 60 mg injections q 6 months, order placed today. Patient/family report no adverse effects related to denosumab therapy. Will follow up on calcium levels at next visit due to patient not having risk factors for hypocalcemia and previous calcium levels WNL. No further changes today, next dose of denosumab due August 2020  An after visit summary was provided and patient advised to follow up with me in 6 months or sooner if any changes in condition or questions regarding medications arise. The patient and family verbalized understanding of information provided by repeating back concepts discussed.

## 2018-06-05 NOTE — Progress Notes (Signed)
Internal Medicine Clinic Attending  Case discussed with Dr. Antony Contras at the time of the visit.  We reviewed the resident's history and exam and pertinent patient test results.  I agree with the assessment, diagnosis, and plan of care documented in the resident's note.   Additionally we reviewed patient's abnormal UA at last visit, she will need a repeat UA WITH Microscopy to determine if she has persistent microscopic hematuria.

## 2018-06-29 ENCOUNTER — Other Ambulatory Visit: Payer: Self-pay | Admitting: Internal Medicine

## 2018-06-29 DIAGNOSIS — M79605 Pain in left leg: Principal | ICD-10-CM

## 2018-06-29 DIAGNOSIS — M79604 Pain in right leg: Secondary | ICD-10-CM

## 2018-07-15 ENCOUNTER — Other Ambulatory Visit: Payer: Self-pay | Admitting: Internal Medicine

## 2018-07-15 DIAGNOSIS — M79605 Pain in left leg: Principal | ICD-10-CM

## 2018-07-15 DIAGNOSIS — M79604 Pain in right leg: Secondary | ICD-10-CM

## 2018-07-16 ENCOUNTER — Telehealth: Payer: Self-pay

## 2018-07-16 NOTE — Telephone Encounter (Signed)
Refill for gabapentin declined. This was discontinued. She should be on amitriptyline and tramadol, not gabapentin.

## 2018-07-16 NOTE — Telephone Encounter (Signed)
Gabapentin no longer on medication list-will send to pcp for review. Please advise.Criss Alvine, Marshawn Normoyle Cassady3/30/202010:11 AM

## 2018-07-16 NOTE — Telephone Encounter (Signed)
error 

## 2018-08-10 ENCOUNTER — Other Ambulatory Visit: Payer: Self-pay | Admitting: Oncology

## 2018-08-10 ENCOUNTER — Other Ambulatory Visit: Payer: Self-pay

## 2018-08-10 MED ORDER — OMEPRAZOLE 20 MG PO CPDR: 20.0000 mg | DELAYED_RELEASE_CAPSULE | Freq: Every day | ORAL | 3 refills | Status: DC

## 2018-08-10 NOTE — Telephone Encounter (Signed)
I can send in Rx for Protonix: let her know this is an acid blocker not in same family as ranitidine.

## 2018-08-10 NOTE — Telephone Encounter (Signed)
Rx sent to [facility] for omeprazole 20 mg daily

## 2018-08-10 NOTE — Telephone Encounter (Signed)
Called pt's daughter- no answer and mailbox has not been set-up, unable to leave a message.

## 2018-08-10 NOTE — Telephone Encounter (Signed)
Pt's daughter states ranitidine (ZANTAC) 150 MG tablet is not safe for her mom to use. Please call pt's daughter back.

## 2018-08-12 ENCOUNTER — Other Ambulatory Visit: Payer: Self-pay | Admitting: Internal Medicine

## 2018-08-12 DIAGNOSIS — M79605 Pain in left leg: Principal | ICD-10-CM

## 2018-08-12 DIAGNOSIS — M79604 Pain in right leg: Secondary | ICD-10-CM

## 2018-08-15 ENCOUNTER — Telehealth: Payer: Self-pay | Admitting: Internal Medicine

## 2018-08-15 DIAGNOSIS — K2970 Gastritis, unspecified, without bleeding: Secondary | ICD-10-CM

## 2018-08-15 MED ORDER — FAMOTIDINE 20 MG PO TABS: 20.0000 mg | ORAL_TABLET | Freq: Two times a day (BID) | ORAL | 1 refills | Status: DC | PRN

## 2018-08-15 NOTE — Telephone Encounter (Signed)
Will defer to Dr. Antony Contras. Per her last note on gastritis, patient was supposed to be on zantac and not on PPI. However, she may have been switched over since zantac has been recalled. Would consider switching her to famotidine 20 mg and increase if her symptoms are not controlled.

## 2018-08-15 NOTE — Telephone Encounter (Signed)
[facility] [phone] called with concerns from the patient's daughter about her medication omeprazole (PRILOSEC) 20 MG capsule.

## 2018-08-15 NOTE — Telephone Encounter (Signed)
It looks like she was changed to omeprazole on 4/24. I agree famotidine if zantac was not working. Agree with pharmacist, please have her stop Protonix. I sent a prescription for famotidine to her pharmacy. Thanks.

## 2018-08-15 NOTE — Telephone Encounter (Signed)
Lm for pharmacist to rtc, she is at lunch.

## 2018-08-15 NOTE — Telephone Encounter (Signed)
Pharmacist calls and states daughter and she are concerned about pt being on omeprazole due to osteoporosis, could change her to famotidine 20 or 40mg  is what they stock Please advise.

## 2018-08-16 ENCOUNTER — Other Ambulatory Visit: Payer: Self-pay | Admitting: Internal Medicine

## 2018-08-16 DIAGNOSIS — M79604 Pain in right leg: Secondary | ICD-10-CM

## 2018-08-16 DIAGNOSIS — F1190 Opioid use, unspecified, uncomplicated: Secondary | ICD-10-CM

## 2018-08-16 DIAGNOSIS — M79605 Pain in left leg: Principal | ICD-10-CM

## 2018-08-16 NOTE — Telephone Encounter (Signed)
Talked to [facility] pharmacy - they received new famotidine rx this morning.

## 2018-08-16 NOTE — Telephone Encounter (Signed)
Refill Request   traMADol (ULTRAM) 50 MG tablet  [facility] - [place], [place] - [address]

## 2018-08-16 NOTE — Telephone Encounter (Signed)
Agreed. She cannot fill before 30 days. We had a long discussion about her not taking more than prescribed otherwise she would be tapered off. Thank you.

## 2018-08-16 NOTE — Addendum Note (Signed)
Addended by: Neomia Dear on: 08/16/2018 04:04 PM   Modules accepted: Orders

## 2018-08-16 NOTE — Telephone Encounter (Signed)
[facility] - line busy.

## 2018-08-20 ENCOUNTER — Other Ambulatory Visit: Payer: Self-pay | Admitting: Internal Medicine

## 2018-08-20 ENCOUNTER — Ambulatory Visit (HOSPITAL_COMMUNITY): Payer: Medicare Other

## 2018-08-20 DIAGNOSIS — M79605 Pain in left leg: Principal | ICD-10-CM

## 2018-08-20 DIAGNOSIS — M79604 Pain in right leg: Secondary | ICD-10-CM

## 2018-08-20 DIAGNOSIS — F1190 Opioid use, unspecified, uncomplicated: Secondary | ICD-10-CM

## 2018-08-20 NOTE — Telephone Encounter (Signed)
Needs refill on traMADol Janean Sark) 50 MG tablet [facility] [place], [place] - [address]  pt contact [phone]

## 2018-08-20 NOTE — Telephone Encounter (Signed)
Refill declined. Last filled 30 day supply of tramadol on 4/21. This is her second request this month. Please have her schedule an appointment with me at my next available time. Thanks.

## 2018-08-22 ENCOUNTER — Ambulatory Visit: Payer: Medicare Other

## 2018-08-23 ENCOUNTER — Other Ambulatory Visit: Payer: Self-pay

## 2018-08-23 ENCOUNTER — Telehealth: Payer: Self-pay | Admitting: Pharmacist

## 2018-08-23 ENCOUNTER — Ambulatory Visit (INDEPENDENT_AMBULATORY_CARE_PROVIDER_SITE_OTHER): Payer: Medicare Other | Admitting: Internal Medicine

## 2018-08-23 DIAGNOSIS — M79605 Pain in left leg: Secondary | ICD-10-CM

## 2018-08-23 DIAGNOSIS — M79604 Pain in right leg: Secondary | ICD-10-CM

## 2018-08-23 DIAGNOSIS — F1190 Opioid use, unspecified, uncomplicated: Secondary | ICD-10-CM | POA: Diagnosis not present

## 2018-08-23 DIAGNOSIS — M810 Age-related osteoporosis without current pathological fracture: Secondary | ICD-10-CM

## 2018-08-23 MED ORDER — PROLIA 60 MG/ML ~~LOC~~ SOSY: 60.0000 mg | PREFILLED_SYRINGE | SUBCUTANEOUS | 0 refills | Status: DC

## 2018-08-23 NOTE — Progress Notes (Signed)
Spoke to patient's daughter, she requested appointment for next denosumab dose, due 12/03/18. Appointment scheduled and prescription sent.

## 2018-08-23 NOTE — Patient Instructions (Signed)
Thank you for coming to the clinic today. It was a pleasure to see you.   For your pain  - I have sent in a one month supply of tramadol to your pharmacy, the pharmacy will be able to refill this for you on may 18th. You will need to speak with Dr. Antony Contras about your next refill.

## 2018-08-23 NOTE — Progress Notes (Signed)
   Chronic pain  Allison Rojas and her daughter Allison Rojas present for request of a Tramadol refill. Unfortunately there was a misunderstanding, they called to schedule the regular chronic pain follow up and were placed on the [facility] schedule. Yesterday a provider at our office attempted to contact her over the phone to clarify but were not able to reach them. At last office visit Dr. Antony Contras reiterated the pain medication contract. Allison Rojas last picked up her prescription on April 21st. She has been taking the tramadol twice daily as prescribed and the database reflects this (On January 3 she received a 30 day supply, her next refill was 34 days later February 5th. On February 5th she received a 14 day supply and two weeks later on February 18th she picked up a 30 day supply. On March 18th which was 30 days from the prior Rx she again picked up a 30 day supply. 35 days later she picked up the most recent prescription on April 21.) Last UDS was within the past 6 months and it was appropriate. She denies issue with constipation or excessive daytime fatigue.The current dose of Tramadol has offered Allison Rojas relief from her pain. Allison Rojas request that three months of refills be provided today, she explained that she has a busy work schedule and would prefer not to come back for another continuity visit. I reiterated that unfortunately this was not allowed as stated in the pain medication contract and that these rules are for patient safety and she was understanding.  - 1 month refill sent to the pharmacy with instructions that this is not to be obtained until 09/03/2018 - follow up scheduled with PCP for one month from the day that this prescription will be complete

## 2018-08-30 MED ORDER — TRAMADOL HCL 50 MG PO TABS: 50.0000 mg | ORAL_TABLET | Freq: Two times a day (BID) | ORAL | 0 refills | Status: DC | PRN

## 2018-08-30 NOTE — Progress Notes (Signed)
Internal Medicine Clinic Attending  Case discussed with Dr. Blum at the time of the visit.  We reviewed the resident's history and exam and pertinent patient test results.  I agree with the assessment, diagnosis, and plan of care documented in the resident's note. 

## 2018-10-01 ENCOUNTER — Encounter: Payer: Self-pay | Admitting: Internal Medicine

## 2018-10-01 ENCOUNTER — Other Ambulatory Visit: Payer: Self-pay

## 2018-10-01 ENCOUNTER — Ambulatory Visit (INDEPENDENT_AMBULATORY_CARE_PROVIDER_SITE_OTHER): Payer: Medicare Other | Admitting: Internal Medicine

## 2018-10-01 VITALS — BP 114/67 | HR 88 | Temp 98.2°F | Ht 59.5 in | Wt 84.4 lb

## 2018-10-01 DIAGNOSIS — Z79899 Other long term (current) drug therapy: Secondary | ICD-10-CM | POA: Diagnosis not present

## 2018-10-01 DIAGNOSIS — M40204 Unspecified kyphosis, thoracic region: Secondary | ICD-10-CM

## 2018-10-01 DIAGNOSIS — M79604 Pain in right leg: Secondary | ICD-10-CM

## 2018-10-01 DIAGNOSIS — G8929 Other chronic pain: Secondary | ICD-10-CM | POA: Diagnosis not present

## 2018-10-01 DIAGNOSIS — Z79891 Long term (current) use of opiate analgesic: Secondary | ICD-10-CM

## 2018-10-01 DIAGNOSIS — R3129 Other microscopic hematuria: Secondary | ICD-10-CM

## 2018-10-01 DIAGNOSIS — R319 Hematuria, unspecified: Secondary | ICD-10-CM

## 2018-10-01 DIAGNOSIS — F1190 Opioid use, unspecified, uncomplicated: Secondary | ICD-10-CM

## 2018-10-01 MED ORDER — TRAMADOL HCL 50 MG PO TABS: 50.0000 mg | ORAL_TABLET | Freq: Two times a day (BID) | ORAL | 0 refills | Status: DC | PRN

## 2018-10-01 NOTE — Progress Notes (Signed)
   CC: Chronic pain follow up  HPI:  [name] is a 83 y.o. female with past medical history outlined below here for chronic pain follow up. For the details of today's visit, please refer to the assessment and plan.  Past Medical History:  Diagnosis Date  . Allergy   . Chronic low back pain   . Compression fracture    T9 and T12 noted 05/2012 imaging. Followed by pain clinic   . Fall    Jan or Feb 2014   . Gastritis   . GERD (gastroesophageal reflux disease)   . Insomnia   . Irregular heart rate   . Osteoporosis   . Peptic ulcer disease   . Rib pain   . Right wrist fracture    3 - 4 monthes ago    Review of Systems  Genitourinary: Negative for dysuria and hematuria.  Musculoskeletal: Positive for back pain. Negative for falls.    Physical Exam:  Vitals:   10/01/18 1525  BP: 114/67  Pulse: 88  Temp: 98.2 F (36.8 C)  TempSrc: Oral  SpO2: 99%  Weight: 84 lb 6.4 oz (38.3 kg)    Constitutional: NAD, appears comfortable. Thin, cachetic appearing Cardiovascular:RRR, no murmurs, rubs, or gallops.  Pulmonary/Chest:CTAB, no wheezes, rales, or rhonchi.  Extremities: Warm and well perfused.  No edema.  Neurological:A&Ox3, CN II - XII grossly intact. Severe thoracic kyphosis  Assessment & Plan:   See Encounters Tab for problem based charting.  Patient discussed with Dr. Angelia Mould

## 2018-10-01 NOTE — Patient Instructions (Signed)
Ms. Kaman,  It was a pleasure to see you today. I will call you with the results of your testing today. I sent a refill of tramadol to your pharmacy. Please follow up with me again in 3 months. If you have any questions or concerns, call our clinic at [phone] or after hours call [phone] and ask for the internal medicine resident on call. Thank you!  Dr. Philipp Ovens

## 2018-10-01 NOTE — Assessment & Plan Note (Addendum)
Patient was previously noted to have microscopic hematuria on 2 prior urinalysis checked 2 months apart.  She was referred to urology at her last visit, but did not schedule this appointment.  Daughter reports patient has an intense fear of invasive procedures and pain due to a prior traumatizing experience back home in [place].  When told there may be a procedure done, patient refused.  We discussed options and implications of work-up today.  I am open to providing an extra tramadol tablet prior to cystoscopy.  However, if mass or abnormality is found, patient is not sure at this time if she be willing to proceed with further biopsy or surgery.  We also discussed the option of CT urography, which she understands is less optimal than cystoscopy.  She is open to CT urography, but again is unsure if she would want to proceed with further work-up pending results, even if concerning for mass or malignancy.  Given her advanced age and severe malnutrition, I do not think it is unreasonable to take a conservative approach.  Plan is to repeat urinalysis today.  If microscopic hematuria persists, she and her daughter will discuss options and decide how they wish to proceed.  Options include no further work-up, CT urography, or referral to urology for cystoscopy. --Follow-up urinalysis --Patient and daughter are to discuss wishes for further work-up if hematuria persists  ADDENDUM: Unfortunately UA was canceled due to inadequate volume of urine. Attempted to call patient without success. Needs to return for lab only visit. Orders placed, sent message to lab & front desk for scheduling.

## 2018-10-01 NOTE — Assessment & Plan Note (Addendum)
Patient is here for chronic pain follow-up.  Establishing an effective regimen has been challenging and previously well-documented (see prior clinic notes).  We have optimized her regimen as best as possible.  She is currently taking amitriptyline 50 mg nightly and tramadol 50 mg twice daily.  Patient reports compliance with this regimen and feels that it is helping her.  Reviewed database, refill history has been appropriate.  Will check UDS today.  1 month refill of tramadol sent to her pharmacy.  If UDS is appropriate, will send in additional 2 months. -Checking UDS --Refilled tramadol 50 mg twice daily (#60), no refills. Do not fill before 6/20. -- If UDS is appropriate, will send additional prescriptions for July and August.  -- Follow up 3 months   ADDEDNUM: UDS appropriate. Sent additional prescriptions for July & August.

## 2018-10-03 ENCOUNTER — Ambulatory Visit (HOSPITAL_COMMUNITY): Payer: Medicare Other

## 2018-10-04 LAB — URINALYSIS, ROUTINE W REFLEX MICROSCOPIC

## 2018-10-05 LAB — TOXASSURE SELECT,+ANTIDEPR,UR

## 2018-10-05 NOTE — Addendum Note (Signed)
Addended by: Jodean Lima on: 10/05/2018 08:56 AM   Modules accepted: Orders

## 2018-10-06 LAB — TOXASSURE SELECT,+ANTIDEPR,UR

## 2018-10-07 ENCOUNTER — Other Ambulatory Visit: Payer: Self-pay | Admitting: Internal Medicine

## 2018-10-07 DIAGNOSIS — M79604 Pain in right leg: Secondary | ICD-10-CM

## 2018-10-10 MED ORDER — TRAMADOL HCL 50 MG PO TABS: 50.0000 mg | ORAL_TABLET | Freq: Two times a day (BID) | ORAL | 0 refills | Status: DC | PRN

## 2018-10-10 NOTE — Addendum Note (Signed)
Addended by: Jodean Lima on: 10/10/2018 12:10 PM   Modules accepted: Orders

## 2018-10-12 NOTE — Progress Notes (Signed)
Internal Medicine Clinic Attending  Case discussed with Dr. Guilloud at the time of the visit.  We reviewed the resident's history and exam and pertinent patient test results.  I agree with the assessment, diagnosis, and plan of care documented in the resident's note.  

## 2018-10-15 ENCOUNTER — Other Ambulatory Visit: Payer: Self-pay | Admitting: Internal Medicine

## 2018-10-15 DIAGNOSIS — K2970 Gastritis, unspecified, without bleeding: Secondary | ICD-10-CM

## 2018-10-17 ENCOUNTER — Other Ambulatory Visit: Payer: Medicare Other

## 2018-10-17 ENCOUNTER — Other Ambulatory Visit: Payer: Self-pay

## 2018-10-17 DIAGNOSIS — R319 Hematuria, unspecified: Secondary | ICD-10-CM

## 2018-10-18 LAB — URINALYSIS, ROUTINE W REFLEX MICROSCOPIC
Bilirubin, UA: NEGATIVE
Glucose, UA: NEGATIVE
Ketones, UA: NEGATIVE
Leukocytes,UA: NEGATIVE
Nitrite, UA: NEGATIVE
Protein,UA: NEGATIVE
RBC, UA: NEGATIVE
Specific Gravity, UA: 1.014 (ref 1.005–1.030)
Urobilinogen, Ur: 0.2 mg/dL (ref 0.2–1.0)
pH, UA: 6 (ref 5.0–7.5)

## 2018-11-26 MED FILL — PROLIA 60 MG/ML SOLN: 60 | 180 days supply | Qty: 1 | Fill #0

## 2018-12-03 ENCOUNTER — Ambulatory Visit: Payer: Medicare Other | Admitting: Pharmacist

## 2018-12-03 DIAGNOSIS — M810 Age-related osteoporosis without current pathological fracture: Secondary | ICD-10-CM

## 2018-12-03 DIAGNOSIS — K2970 Gastritis, unspecified, without bleeding: Secondary | ICD-10-CM

## 2018-12-04 MED ORDER — PROLIA 60 MG/ML ~~LOC~~ SOSY: 60.0000 mg | PREFILLED_SYRINGE | SUBCUTANEOUS | 0 refills | Status: DC

## 2018-12-04 MED ORDER — FAMOTIDINE 20 MG PO TABS: ORAL_TABLET | ORAL | 0 refills | Status: DC

## 2018-12-04 NOTE — Progress Notes (Signed)
Allison Rojas is a 83 y.o. female who reports to the clinic for injection education. She reports to clinic with her daughter, who assists at home in her care. Daughter was educated on denosumab (Prolia) injection, including dose, route, indication, preparation, administration, and disposal. Injection technique was observed in clinic. Patient's daughter demonstrated correct injection and may benefit from further injection education in the future. An education handout was provided  Patient/family were advised to follow up in 6 months or sooner if any changes in condition or questions regarding medications arise.   The patient and family verbalized understanding of information provided by repeating back concepts discussed.

## 2018-12-09 DIAGNOSIS — Z23 Encounter for immunization: Secondary | ICD-10-CM | POA: Diagnosis not present

## 2018-12-13 ENCOUNTER — Other Ambulatory Visit: Payer: Self-pay | Admitting: Internal Medicine

## 2018-12-13 DIAGNOSIS — M79604 Pain in right leg: Secondary | ICD-10-CM

## 2018-12-27 ENCOUNTER — Encounter: Payer: Self-pay | Admitting: *Deleted

## 2019-01-02 ENCOUNTER — Other Ambulatory Visit: Payer: Self-pay | Admitting: Internal Medicine

## 2019-01-02 ENCOUNTER — Ambulatory Visit (INDEPENDENT_AMBULATORY_CARE_PROVIDER_SITE_OTHER): Payer: Medicare Other | Admitting: *Deleted

## 2019-01-02 ENCOUNTER — Ambulatory Visit: Payer: Medicare Other | Admitting: Internal Medicine

## 2019-01-02 ENCOUNTER — Other Ambulatory Visit: Payer: Self-pay

## 2019-01-02 DIAGNOSIS — M79604 Pain in right leg: Secondary | ICD-10-CM

## 2019-01-02 DIAGNOSIS — R319 Hematuria, unspecified: Secondary | ICD-10-CM | POA: Diagnosis not present

## 2019-01-02 DIAGNOSIS — Z23 Encounter for immunization: Secondary | ICD-10-CM

## 2019-01-02 DIAGNOSIS — F1190 Opioid use, unspecified, uncomplicated: Secondary | ICD-10-CM

## 2019-01-02 DIAGNOSIS — M79605 Pain in left leg: Secondary | ICD-10-CM

## 2019-01-02 MED ORDER — TRAMADOL HCL 50 MG PO TABS: 50.0000 mg | ORAL_TABLET | Freq: Two times a day (BID) | ORAL | 0 refills | Status: DC | PRN

## 2019-01-02 NOTE — Progress Notes (Signed)
I reviewed Dr Rivka Safer last 2 CC notes which indicated concern about tramadol usage, expectations, and history of inappropriate UDS's.  Her most recent UDS was appropriate.  I refilled her tramadol for 1 month.  She needs to give a urine sample for a UA as Dr. Philipp Ovens is working up her hematuria.  She is also going to get a flu shot today.  She will need to see Dr. Philipp Ovens in November for a continuity clinic appointment.

## 2019-01-02 NOTE — Progress Notes (Signed)
Urine specimen collected for UA.

## 2019-01-03 ENCOUNTER — Telehealth: Payer: Self-pay | Admitting: *Deleted

## 2019-01-03 ENCOUNTER — Encounter: Payer: Self-pay | Admitting: *Deleted

## 2019-01-03 LAB — URINALYSIS, ROUTINE W REFLEX MICROSCOPIC
Bilirubin, UA: NEGATIVE
Glucose, UA: NEGATIVE
Ketones, UA: NEGATIVE
Nitrite, UA: NEGATIVE
Protein,UA: NEGATIVE
RBC, UA: NEGATIVE
Specific Gravity, UA: 1.018 (ref 1.005–1.030)
Urobilinogen, Ur: 0.2 mg/dL (ref 0.2–1.0)
pH, UA: 6.5 (ref 5.0–7.5)

## 2019-01-03 LAB — MICROSCOPIC EXAMINATION: RBC, Urine: NONE SEEN /hpf (ref 0–2)

## 2019-01-03 NOTE — Telephone Encounter (Signed)
Daughter called back, gave her dr guilloud's message, informed her tramadol sent to pharm

## 2019-01-03 NOTE — Telephone Encounter (Signed)
Allison Ochs, MD  P Imp Triage Nurse Pool        Hello,   Can we please call this patient and tell her that her UA was normal, no blood this time. The blood seen on her prior UA could have been from an infection that resolved. We will continue to monitor.   Thanks!  Dr. Philipp Ovens

## 2019-01-03 NOTE — Telephone Encounter (Signed)
Thank you :)

## 2019-01-03 NOTE — Telephone Encounter (Signed)
Called patient via [address], [place]. At first number phone was picked up and disconnected immediately X 2. Message left on VM at second number to return call. Hubbard Hartshorn, RN, BSN

## 2019-01-10 ENCOUNTER — Ambulatory Visit: Payer: Medicare Other | Admitting: Internal Medicine

## 2019-02-10 ENCOUNTER — Other Ambulatory Visit: Payer: Self-pay | Admitting: Internal Medicine

## 2019-02-10 DIAGNOSIS — M79604 Pain in right leg: Secondary | ICD-10-CM

## 2019-02-10 DIAGNOSIS — M79605 Pain in left leg: Secondary | ICD-10-CM

## 2019-02-10 DIAGNOSIS — F1190 Opioid use, unspecified, uncomplicated: Secondary | ICD-10-CM

## 2019-02-11 ENCOUNTER — Other Ambulatory Visit: Payer: Self-pay | Admitting: Internal Medicine

## 2019-02-11 DIAGNOSIS — M79605 Pain in left leg: Secondary | ICD-10-CM

## 2019-02-11 DIAGNOSIS — M79604 Pain in right leg: Secondary | ICD-10-CM

## 2019-02-11 DIAGNOSIS — F1190 Opioid use, unspecified, uncomplicated: Secondary | ICD-10-CM

## 2019-02-11 NOTE — Telephone Encounter (Signed)
Approved tramadol refill. Reviewed database, refill history has been appropriate. Last filled 9/22.

## 2019-02-11 NOTE — Telephone Encounter (Signed)
This request has been sent to PCP today via [employer]. Hubbard Hartshorn, BSN, RN-BC

## 2019-02-11 NOTE — Telephone Encounter (Signed)
Needs refill on traMADol (ULTRAM) 50 MG tablet  ;pt contact [place], [place]

## 2019-02-20 ENCOUNTER — Ambulatory Visit (INDEPENDENT_AMBULATORY_CARE_PROVIDER_SITE_OTHER): Payer: Medicare Other | Admitting: Internal Medicine

## 2019-02-20 ENCOUNTER — Other Ambulatory Visit: Payer: Self-pay

## 2019-02-20 ENCOUNTER — Encounter: Payer: Self-pay | Admitting: Internal Medicine

## 2019-02-20 VITALS — BP 101/54 | HR 88 | Temp 98.7°F | Ht 59.5 in | Wt 87.4 lb

## 2019-02-20 DIAGNOSIS — Z681 Body mass index (BMI) 19 or less, adult: Secondary | ICD-10-CM | POA: Diagnosis not present

## 2019-02-20 DIAGNOSIS — R030 Elevated blood-pressure reading, without diagnosis of hypertension: Secondary | ICD-10-CM | POA: Diagnosis not present

## 2019-02-20 DIAGNOSIS — N179 Acute kidney failure, unspecified: Secondary | ICD-10-CM

## 2019-02-20 DIAGNOSIS — K219 Gastro-esophageal reflux disease without esophagitis: Secondary | ICD-10-CM | POA: Diagnosis not present

## 2019-02-20 DIAGNOSIS — G8929 Other chronic pain: Secondary | ICD-10-CM | POA: Diagnosis not present

## 2019-02-20 DIAGNOSIS — M549 Dorsalgia, unspecified: Secondary | ICD-10-CM | POA: Diagnosis not present

## 2019-02-20 DIAGNOSIS — J329 Chronic sinusitis, unspecified: Secondary | ICD-10-CM

## 2019-02-20 DIAGNOSIS — M40204 Unspecified kyphosis, thoracic region: Secondary | ICD-10-CM | POA: Diagnosis not present

## 2019-02-20 DIAGNOSIS — M79604 Pain in right leg: Secondary | ICD-10-CM

## 2019-02-20 DIAGNOSIS — D509 Iron deficiency anemia, unspecified: Secondary | ICD-10-CM | POA: Diagnosis not present

## 2019-02-20 DIAGNOSIS — Z79891 Long term (current) use of opiate analgesic: Secondary | ICD-10-CM

## 2019-02-20 DIAGNOSIS — Z8719 Personal history of other diseases of the digestive system: Secondary | ICD-10-CM | POA: Diagnosis not present

## 2019-02-20 DIAGNOSIS — K2970 Gastritis, unspecified, without bleeding: Secondary | ICD-10-CM

## 2019-02-20 DIAGNOSIS — Z79899 Other long term (current) drug therapy: Secondary | ICD-10-CM | POA: Diagnosis not present

## 2019-02-20 DIAGNOSIS — R64 Cachexia: Secondary | ICD-10-CM | POA: Diagnosis not present

## 2019-02-20 DIAGNOSIS — M810 Age-related osteoporosis without current pathological fracture: Secondary | ICD-10-CM | POA: Diagnosis not present

## 2019-02-20 DIAGNOSIS — D649 Anemia, unspecified: Secondary | ICD-10-CM

## 2019-02-20 DIAGNOSIS — F1190 Opioid use, unspecified, uncomplicated: Secondary | ICD-10-CM

## 2019-02-20 DIAGNOSIS — Z7982 Long term (current) use of aspirin: Secondary | ICD-10-CM | POA: Diagnosis not present

## 2019-02-20 DIAGNOSIS — E43 Unspecified severe protein-calorie malnutrition: Secondary | ICD-10-CM | POA: Diagnosis not present

## 2019-02-20 MED ORDER — TRAMADOL HCL 50 MG PO TABS: 50.0000 mg | ORAL_TABLET | Freq: Two times a day (BID) | ORAL | 0 refills | Status: DC | PRN

## 2019-02-20 MED ORDER — FLUTICASONE PROPIONATE 50 MCG/ACT NA SUSP: 2.0000 | Freq: Every day | NASAL | 5 refills | Status: AC | PRN

## 2019-02-20 MED ORDER — AMITRIPTYLINE HCL 50 MG PO TABS: 50.0000 mg | ORAL_TABLET | Freq: Every day | ORAL | 5 refills | Status: AC

## 2019-02-20 MED ORDER — FAMOTIDINE 20 MG PO TABS: ORAL_TABLET | ORAL | 5 refills | Status: AC

## 2019-02-20 NOTE — Patient Instructions (Addendum)
Allison Rojas,  It was a pleasure to see you. Please follow up in 3 months for your denosumab injection and 6 month follow up with me. I have sent refills of your medications to your pharmacy.   If you have any questions or concerns, call our clinic at [phone] or after hours call [phone] and ask for the internal medicine resident on call.   Thank you!  Dr. Philipp Ovens

## 2019-02-21 LAB — BMP8+ANION GAP
Anion Gap: 16 mmol/L (ref 10.0–18.0)
BUN/Creatinine Ratio: 15 (ref 12–28)
BUN: 26 mg/dL (ref 8–27)
CO2: 25 mmol/L (ref 20–29)
Calcium: 10.5 mg/dL — ABNORMAL HIGH (ref 8.7–10.3)
Chloride: 100 mmol/L (ref 96–106)
Creatinine, Ser: 1.77 mg/dL — ABNORMAL HIGH (ref 0.57–1.00)
GFR calc Af Amer: 31 mL/min/{1.73_m2} — ABNORMAL LOW (ref >59)
GFR calc non Af Amer: 27 mL/min/{1.73_m2} — ABNORMAL LOW (ref >59)
Glucose: 122 mg/dL — ABNORMAL HIGH (ref 65–99)
Potassium: 4.8 mmol/L (ref 3.5–5.2)
Sodium: 141 mmol/L (ref 134–144)

## 2019-02-21 LAB — CBC
Hematocrit: 26.7 % — ABNORMAL LOW (ref 34.0–46.6)
Hemoglobin: 8.1 g/dL — ABNORMAL LOW (ref 11.1–15.9)
MCH: 24.5 pg — ABNORMAL LOW (ref 26.6–33.0)
MCHC: 30.3 g/dL — ABNORMAL LOW (ref 31.5–35.7)
MCV: 81 fL (ref 79–97)
Platelets: 278 10*3/uL (ref 150–450)
RBC: 3.3 x10E6/uL — ABNORMAL LOW (ref 3.77–5.28)
RDW: 16.2 % — ABNORMAL HIGH (ref 11.7–15.4)
WBC: 5.8 10*3/uL (ref 3.4–10.8)

## 2019-02-21 LAB — FERRITIN: Ferritin: 13 ng/mL — ABNORMAL LOW (ref 15–150)

## 2019-02-21 NOTE — Assessment & Plan Note (Signed)
New, mild hypercalcemia seen on BMP. She is not on any medication that should cause this. Possibly hemoconcentrated given likely blood loss anemia and low BP. Need to be repeated. If still elevated, will need PTH and vitamin D levels checked.  -- Follow up for repeat labs (CMP for albumin)

## 2019-02-21 NOTE — Assessment & Plan Note (Signed)
New AKI seen on labs today, unclear etiology but concerning given her ongoing Excedrin use (contains ASA) and worsening anemia. BP is also low, 101/54.  Serum creatinine is up 1.7 from her prior baseline of 1.1. GFR has decreased 48-55 -> 27-31. Unclear chronicity, her last BMP was 1 year ago. I have asked front desk to call patient and schedule ACC follow up asap hopefully within the next week for discussion of results and repeat labs. Hopefully we can avoid an admission but she may need IVFs. -- Follow up within 1 week

## 2019-02-21 NOTE — Progress Notes (Signed)
Subjective:   Patient ID: Allison Rojas female   DOB: 12/16/1935 82 y.o.   MRN: [id number]  HPI: [name] is a 83 y.o. female with past medical history outlined below here for chronic pain follow up. For the details of today's visit, please refer to the assessment and plan.   Past Medical History:  Diagnosis Date  . Allergy   . Chronic low back pain   . Compression fracture    T9 and T12 noted 05/2012 imaging. Followed by pain clinic   . Fall    Jan or Feb 2014   . Gastritis   . GERD (gastroesophageal reflux disease)   . Insomnia   . Irregular heart rate   . Osteoporosis   . Peptic ulcer disease   . Rib pain   . Right wrist fracture    3 - 4 monthes ago   Current Outpatient Medications  Medication Sig Dispense Refill  . acetaminophen (TYLENOL) 500 MG tablet Take 1 tablet (500 mg total) by mouth 3 (three) times daily. 30 tablet 0  . amitriptyline (ELAVIL) 50 MG tablet Take 1 tablet (50 mg total) by mouth at bedtime. 30 tablet 5  . Ascorbic Acid (VITAMIN C) 100 MG CHEW Chew 1 tablet (100 mg total) by mouth daily. 90 each 0  . calcium-vitamin D (OSCAL WITH D) 500-200 MG-UNIT tablet Take 1 tablet by mouth 2 (two) times daily. 60 tablet 0  . diclofenac sodium (VOLTAREN) 1 % GEL Apply 4 g topically 4 (four) times daily. 100 g 0  . famotidine (PEPCID) 20 MG tablet TAKE ONE TABLET BY MOUTH TWO TIMES A DAY AS NEEDED FOR HEARTBURN OR INDIGESTION 60 tablet 5  . ferrous sulfate 325 (65 FE) MG tablet Take 1 tablet (325 mg total) by mouth daily with breakfast. 30 tablet 3  . fluticasone (FLONASE) 50 MCG/ACT nasal spray Place 2 sprays into both nostrils daily as needed for rhinitis. For allergies 16 g 5  . PROLIA 60 MG/ML SOSY injection Inject 60 mg into the skin every 6 (six) months. 1.8 mL 0  . traMADol (ULTRAM) 50 MG tablet Take 1 tablet (50 mg total) by mouth 2 (two) times daily as needed. 60 tablet 0   No current facility-administered medications for this visit.    Family History   Problem Relation Age of Onset  . Colon cancer Neg Hx   . Esophageal cancer Neg Hx   . Rectal cancer Neg Hx   . Stomach cancer Neg Hx    Social History   Socioeconomic History  . Marital status: Widowed    Spouse name: Not on file  . Number of children: 1  . Years of education: Not on file  . Highest education level: Not on file  Occupational History  . Not on file  Social Needs  . Financial resource strain: Not on file  . Food insecurity    Worry: Not on file    Inability: Not on file  . Transportation needs    Medical: Not on file    Non-medical: Not on file  Tobacco Use  . Smoking status: Never Smoker  . Smokeless tobacco: Never Used  Substance and Sexual Activity  . Alcohol use: No  . Drug use: No  . Sexual activity: Never  Lifestyle  . Physical activity    Days per week: Not on file    Minutes per session: Not on file  . Stress: Not on file  Relationships  . Social connections  Talks on phone: Not on file    Gets together: Not on file    Attends religious service: Not on file    Active member of club or organization: Not on file    Attends meetings of clubs or organizations: Not on file    Relationship status: Not on file  Other Topics Concern  . Not on file  Social History Narrative   Financial assistance approved for 100% discount at [employer] and has [facility] card per [name]   11/30/2009   She is widowed, she has 2 children (only admits to 1 daughter now 07/2012), she does not smoke cigarettes or drink alcohol   Lives with daughter Allison Rojas [phone]                Review of Systems: Review of Systems  Respiratory: Negative for shortness of breath.   Cardiovascular: Negative for chest pain.  Musculoskeletal: Positive for back pain. Negative for falls.     Objective:  Physical Exam:  Vitals:   02/20/19 1034  BP: (!) 101/54  Pulse: 88  Temp: 98.7 F (37.1 C)  TempSrc: Oral  SpO2: 100%  Weight: 87 lb 6.4 oz (39.6 kg)  Height: 4' 11.5"  (1.511 m)    Constitutional: NAD, appears comfortable. Thin, cachetic appearing Cardiovascular:RRR, no murmurs, rubs, or gallops.  Pulmonary/Chest:CTAB, no wheezes, rales, or rhonchi.  Extremities: Warm and well perfused.  No edema.  Neurological:A&Ox3, CN II - XII grossly intact. Severe thoracic kyphosis  Assessment & Plan:

## 2019-02-21 NOTE — Assessment & Plan Note (Signed)
Patient is here for chronic pain follow up. We have established an optimal regimen of tramadol 50 mg BID and amitriptyline 50 mg QHS. Database reviewed today, refill history is appropriate. Last filled tramadol on 02/11/2019. Her last UDS on 10/01/18 was appropriate as well. Continue current regimen, now that she is stable we can space out her follow up to q6 months.  -- Refilled tramadol 50 mg BID prn (#60), x 2 refill (Do not fill before 11/26, 12/26, and 1/26) -- Continue amitriptyline 50 mg QHS  -- Follow up 6 months

## 2019-02-21 NOTE — Assessment & Plan Note (Addendum)
Repeat labs today show worsening iron deficiency anemia, with hemoglobin 8.1 from prior baseline of 10-11 and ferritin of 13. She also has a new AKI. She has a history of gastric ulcer treated in [place] and gastritis seen on EGD here in 2015. I do not see that she has ever had a colonoscopy. Given her severe malnutrition and advanced age, I am uncertain if she would be a candidate for endoscopy at this point. She is having symptoms of uncontrolled GERD but PPI was discontinued due to her severe osteoporosis and treatment on dunosumab. She also frequently take Excedrin which contains 250 mg of aspirin. I have counseled her multiple times to discontinue this medication but she continues to take it. With her language barrier, I think it's best to bring her back to clinic for repeat labs and in person conversation regarding risks and benefits of PPI, endoscopy, and discontinuation of Excedrin. She will also need iron supplementation.  -- Return to clinic asap within 1 week for follow up and further discussion

## 2019-02-21 NOTE — Assessment & Plan Note (Signed)
Patient has a history of gastric ulcer treated in [place] and gastritis seen on EGD in February of 2015. Biopsies were negative for H. Pylori. Previously on a PPI but discontinued due to her severe osteoporosis. She has been having uncontrolled GERD symptoms for the past few months. She is currently taking famotidine PRN. On labs today she has a worsening of her iron deficiency anemia and new AKI. BP is also low, 101/54. I am concerned for possible upper GI bleed. I have asked patient to return to clinic asap for further evaluation. Unclear if she is a candidate for EGD but will need follow up with GI vs. Admission for inpatient consultation. Of note, she continues to take Excedrin with contains 250 mg of aspirin. She has been counseled to discontinue this multiple times at prior visits. This will need to be readdressed.  -- Follow up asap

## 2019-02-22 ENCOUNTER — Ambulatory Visit (INDEPENDENT_AMBULATORY_CARE_PROVIDER_SITE_OTHER): Payer: Medicare Other | Admitting: Internal Medicine

## 2019-02-22 ENCOUNTER — Encounter: Payer: Self-pay | Admitting: Internal Medicine

## 2019-02-22 VITALS — BP 115/69 | HR 70 | Temp 98.8°F | Ht 59.5 in | Wt 87.0 lb

## 2019-02-22 DIAGNOSIS — D649 Anemia, unspecified: Secondary | ICD-10-CM | POA: Diagnosis not present

## 2019-02-22 DIAGNOSIS — N179 Acute kidney failure, unspecified: Secondary | ICD-10-CM | POA: Diagnosis not present

## 2019-02-22 LAB — COMPREHENSIVE METABOLIC PANEL
ALT: 15 U/L (ref 0–44)
AST: 22 U/L (ref 15–41)
Albumin: 3.8 g/dL (ref 3.5–5.0)
Alkaline Phosphatase: 47 U/L (ref 38–126)
Anion gap: 8 (ref 5–15)
BUN: 26 mg/dL — ABNORMAL HIGH (ref 8–23)
CO2: 27 mmol/L (ref 22–32)
Calcium: 8.9 mg/dL (ref 8.9–10.3)
Chloride: 105 mmol/L (ref 98–111)
Creatinine, Ser: 1.39 mg/dL — ABNORMAL HIGH (ref 0.44–1.00)
GFR calc Af Amer: 41 mL/min — ABNORMAL LOW (ref >60)
GFR calc non Af Amer: 35 mL/min — ABNORMAL LOW (ref >60)
Glucose, Bld: 90 mg/dL (ref 70–99)
Potassium: 4.5 mmol/L (ref 3.5–5.1)
Sodium: 140 mmol/L (ref 135–145)
Total Bilirubin: 0.6 mg/dL (ref 0.3–1.2)
Total Protein: 7.3 g/dL (ref 6.5–8.1)

## 2019-02-22 LAB — CBC
HCT: 28.5 % — ABNORMAL LOW (ref 36.0–46.0)
Hemoglobin: 8.4 g/dL — ABNORMAL LOW (ref 12.0–15.0)
MCH: 24.1 pg — ABNORMAL LOW (ref 26.0–34.0)
MCHC: 29.5 g/dL — ABNORMAL LOW (ref 30.0–36.0)
MCV: 81.9 fL (ref 80.0–100.0)
Platelets: 276 10*3/uL (ref 150–400)
RBC: 3.48 MIL/uL — ABNORMAL LOW (ref 3.87–5.11)
RDW: 18.3 % — ABNORMAL HIGH (ref 11.5–15.5)
WBC: 3.8 10*3/uL — ABNORMAL LOW (ref 4.0–10.5)
nRBC: 0 % (ref 0.0–0.2)

## 2019-02-22 MED ORDER — TRAMADOL HCL 50 MG PO TABS: 50.0000 mg | ORAL_TABLET | Freq: Once | ORAL | 0 refills | Status: DC | PRN

## 2019-02-22 MED ORDER — SODIUM CHLORIDE 0.9 % IV BOLUS: 1000.0000 mL | Freq: Once | INTRAVENOUS | Status: DC

## 2019-02-22 NOTE — Progress Notes (Signed)
   CC: Acute worsening anemia and AKI   HPI:  [name] is a 82 y.o. year-old female with PMH listed below who presents to clinic for acute worsening anemia and AKI. Please see problem based assessment and plan for further details.   Past Medical History:  Diagnosis Date  . Allergy   . Chronic low back pain   . Compression fracture    T9 and T12 noted 05/2012 imaging. Followed by pain clinic   . Fall    Jan or Feb 2014   . Gastritis   . GERD (gastroesophageal reflux disease)   . Insomnia   . Irregular heart rate   . Osteoporosis   . Peptic ulcer disease   . Rib pain   . Right wrist fracture    3 - 4 monthes ago   Review of Systems:   Review of Systems  Constitutional: Negative for chills, fever, malaise/fatigue and weight loss.  Respiratory: Negative for shortness of breath.   Cardiovascular: Negative for chest pain, palpitations and leg swelling.  Musculoskeletal: Positive for back pain.  Neurological: Negative for dizziness and headaches.     Physical Exam:  Vitals:   02/22/19 0935  BP: 126/65  Pulse: 79  Temp: 98.8 F (37.1 C)  TempSrc: Oral  SpO2: 100%  Weight: 87 lb (39.5 kg)  Height: 4' 11.5" (1.511 m)    General: chronically-ill appearing elderly female in no acute distress  Eyes: conjunctival pallor  Cardiac: regular rate and rhythm, nl S1/S2, no murmurs, rubs or gallops  Pulm: CTAB, no wheezes or crackles, no increased work of breathing on room air  Abd: soft, NTND, bowel sounds are normoactive  Ext: warm and well perfused, no peripheral edema   Assessment & Plan:   See Encounters Tab for problem based charting.  Patient discussed with Dr. Lynnae January

## 2019-02-22 NOTE — Assessment & Plan Note (Signed)
Patient was found to have new AKI during PCP visit on 11/4 thought to be secondary to worsening anemia from UGIB and relative hypotension (BP 101/54, much lower than usual). She presents today for follow up. Other than intermittent use of Excedrin for back pain, she does not take any other NSAIDs or nephrotoxic medications. Denies urinary symptoms and has not had any issues voiding. Repeat labs show improvement in renal function though not back to baseline, 1.77-> 1.39 (BL 1.1). I do think this is from soft BPs secondary to slow UGIB that seems to have spontaneously resolved. Encouraged her to drink plenty of fluids and asked her to come for lab visit next Friday to check her renal function.

## 2019-02-22 NOTE — Patient Instructions (Signed)
Ms. Braun,   Your hemoglobin is stable and your kidneys are doing better. This is a good thing! We think you had a bleeding from stomach because of the excedrin you take for pain. Please STOP taking this medication. I sent a prescription for tramadol for 2 weeks that you can take as needed on days you have extra pain.   Make sure to drink plenty of fluids throughout the week.   Please come back next week to repeat blood work.   Call us if you have any questions or concerns.   - Dr. Frederico Hamman

## 2019-02-22 NOTE — Assessment & Plan Note (Addendum)
Allison Rojas presents today for repeat labs after being found to have worsening IDA during PCP visit on 11/4 concerning for an upper GI bleed given history of PUD. She states she has chronic back pain and is on Tramadol BID for it but there are days when she experiences more pain than usual and takes Excedrin which helps a lot. She took 2-3 tablets total last week. She noticed 2-3 episodes of black, tarry stools last week but it has since resolved. No BRBPR or hematuria. She has been able to ambulate around the house without difficulty. Denies fatigue and lightheadedness. On exam, she appears chronically ill which is her baseline, and has conjunctival pallor.  She is also orthostatic from sitting to standing but asymptomatic.  Repeat labs show hemoglobin stable at 8.4.   - STOP excedrin and advised against any NSAID use  - Tramadol 50 mg once daily PRN (#14 tablets, no RF) to use as extra dose on days she is experiencing more pain. Advised to patient and daughter that this may not be permanent and they need to discuss with PCP further  - Lab visit next week for repeat CBC  - Encouraged PO intake  - Continue famotidine

## 2019-02-22 NOTE — Progress Notes (Signed)
Internal Medicine Clinic Attending  Case discussed with Dr. Santos-Sanchez at the time of the visit.  We reviewed the resident's history and exam and pertinent patient test results.  I agree with the assessment, diagnosis, and plan of care documented in the resident's note.    

## 2019-02-22 NOTE — Assessment & Plan Note (Signed)
Resolved. Likely secondary to hemoconcentration from slow UGIB that has resolved.

## 2019-03-01 ENCOUNTER — Other Ambulatory Visit (INDEPENDENT_AMBULATORY_CARE_PROVIDER_SITE_OTHER): Payer: Medicare Other

## 2019-03-01 ENCOUNTER — Other Ambulatory Visit: Payer: Self-pay | Admitting: Internal Medicine

## 2019-03-01 ENCOUNTER — Ambulatory Visit (INDEPENDENT_AMBULATORY_CARE_PROVIDER_SITE_OTHER): Payer: Medicare Other | Admitting: Internal Medicine

## 2019-03-01 DIAGNOSIS — Z7982 Long term (current) use of aspirin: Secondary | ICD-10-CM | POA: Diagnosis not present

## 2019-03-01 DIAGNOSIS — Z79899 Other long term (current) drug therapy: Secondary | ICD-10-CM | POA: Diagnosis not present

## 2019-03-01 DIAGNOSIS — Z79891 Long term (current) use of opiate analgesic: Secondary | ICD-10-CM | POA: Diagnosis not present

## 2019-03-01 DIAGNOSIS — I10 Essential (primary) hypertension: Secondary | ICD-10-CM

## 2019-03-01 DIAGNOSIS — F1190 Opioid use, unspecified, uncomplicated: Secondary | ICD-10-CM

## 2019-03-01 DIAGNOSIS — N179 Acute kidney failure, unspecified: Secondary | ICD-10-CM | POA: Diagnosis not present

## 2019-03-01 DIAGNOSIS — G8929 Other chronic pain: Secondary | ICD-10-CM | POA: Diagnosis not present

## 2019-03-01 DIAGNOSIS — E611 Iron deficiency: Secondary | ICD-10-CM

## 2019-03-01 DIAGNOSIS — E785 Hyperlipidemia, unspecified: Secondary | ICD-10-CM | POA: Diagnosis not present

## 2019-03-01 DIAGNOSIS — K219 Gastro-esophageal reflux disease without esophagitis: Secondary | ICD-10-CM

## 2019-03-01 DIAGNOSIS — M4854XD Collapsed vertebra, not elsewhere classified, thoracic region, subsequent encounter for fracture with routine healing: Secondary | ICD-10-CM

## 2019-03-01 DIAGNOSIS — D509 Iron deficiency anemia, unspecified: Secondary | ICD-10-CM

## 2019-03-01 DIAGNOSIS — M79604 Pain in right leg: Secondary | ICD-10-CM

## 2019-03-01 DIAGNOSIS — D649 Anemia, unspecified: Secondary | ICD-10-CM | POA: Diagnosis not present

## 2019-03-01 LAB — CBC
HCT: 27.9 % — ABNORMAL LOW (ref 36.0–46.0)
Hemoglobin: 8.6 g/dL — ABNORMAL LOW (ref 12.0–15.0)
MCH: 24.6 pg — ABNORMAL LOW (ref 26.0–34.0)
MCHC: 30.8 g/dL (ref 30.0–36.0)
MCV: 79.7 fL — ABNORMAL LOW (ref 80.0–100.0)
Platelets: 253 10*3/uL (ref 150–400)
RBC: 3.5 MIL/uL — ABNORMAL LOW (ref 3.87–5.11)
RDW: 17.9 % — ABNORMAL HIGH (ref 11.5–15.5)
WBC: 4.6 10*3/uL (ref 4.0–10.5)
nRBC: 0 % (ref 0.0–0.2)

## 2019-03-01 LAB — COMPREHENSIVE METABOLIC PANEL
ALT: 15 U/L (ref 0–44)
AST: 25 U/L (ref 15–41)
Albumin: 3.8 g/dL (ref 3.5–5.0)
Alkaline Phosphatase: 44 U/L (ref 38–126)
Anion gap: 12 (ref 5–15)
BUN: 30 mg/dL — ABNORMAL HIGH (ref 8–23)
CO2: 26 mmol/L (ref 22–32)
Calcium: 9 mg/dL (ref 8.9–10.3)
Chloride: 99 mmol/L (ref 98–111)
Creatinine, Ser: 1.55 mg/dL — ABNORMAL HIGH (ref 0.44–1.00)
GFR calc Af Amer: 36 mL/min — ABNORMAL LOW (ref >60)
GFR calc non Af Amer: 31 mL/min — ABNORMAL LOW (ref >60)
Glucose, Bld: 188 mg/dL — ABNORMAL HIGH (ref 70–99)
Potassium: 4.2 mmol/L (ref 3.5–5.1)
Sodium: 137 mmol/L (ref 135–145)
Total Bilirubin: 0.5 mg/dL (ref 0.3–1.2)
Total Protein: 7.1 g/dL (ref 6.5–8.1)

## 2019-03-01 MED ORDER — FERROUS SULFATE 325 (65 FE) MG PO TABS: 325.0000 mg | ORAL_TABLET | Freq: Every day | ORAL | 3 refills | Status: DC

## 2019-03-01 MED ORDER — TRAMADOL HCL 50 MG PO TABS: 50.0000 mg | ORAL_TABLET | Freq: Three times a day (TID) | ORAL | 0 refills | Status: DC | PRN

## 2019-03-01 NOTE — Progress Notes (Signed)
Iron infusion orders sent.

## 2019-03-01 NOTE — Addendum Note (Signed)
Addended by: Truddie Crumble on: 03/01/2019 09:09 AM   Modules accepted: Orders

## 2019-03-01 NOTE — Assessment & Plan Note (Signed)
Lab Results  Component Value Date   WBC 4.6 03/01/2019   HGB 8.6 (L) 03/01/2019   HCT 27.9 (L) 03/01/2019   MCV 79.7 (L) 03/01/2019   PLT 253 03/01/2019   Presents for f/u visit for iron deficiency anemia. Cbc checked this am shows improving anemia w/ hgb from 8.4->8.6. Discussed in detail regarding need to monitor bowel movement and to avoid Excedrin (aspirin) use to prevent GI bleed as she did have hx of PUD. Her daughter and she mentions that they would like to avoid further invasive GI work-up due to significant discomfort after prior EGD.   - Iron infusion ordered and scheduled for short stay unit 03/04/19 - Refilled iron supplement - F/u with PCP - C/w famotidine

## 2019-03-01 NOTE — Assessment & Plan Note (Signed)
Mentions that she was instructed to take additional tramadol rather than her aspirin at home for her chronic pain to prevent further GI bleed and worsening OTC pain med use. States pharmacy informed her they cannot dispense further med as instructed. Discussed with Dr.Santos-Sanchez who saw her last week. Confirmed she needs new prescription with updated frequency.   - New paper script provided w/ updated frequency for tramadol

## 2019-03-01 NOTE — Patient Instructions (Addendum)
Dear [name],  Thank you for allowing [place] to provide your care today. Today we discussed your iron defiency anemia    Please go to short stay unit on 03/04/19 at 8am  I have ordered cbc, cmp labs for you. I will call if any are abnormal.    Today we made the following changes to your medications:    Please pick up your tramadol and ferrous sulfate  Should you have any questions or concerns please call the internal medicine clinic at [phone].    Thank you for choosing [facility].   Iron Deficiency Anemia, Adult Iron-deficiency anemia is when you have a low amount of red blood cells or hemoglobin. This happens because you have too little iron in your body. Hemoglobin carries oxygen to parts of the body. Anemia can cause your body to not get enough oxygen. It may or may not cause symptoms. Follow these instructions at home: Medicines  Take over-the-counter and prescription medicines only as told by your doctor. This includes iron pills (supplements) and vitamins.  If you cannot handle taking iron pills by mouth, ask your doctor about getting iron through: ? A vein (intravenously). ? A shot (injection) into a muscle.  Take iron pills when your stomach is empty. If you cannot handle this, take them with food.  Do not drink milk or take antacids at the same time as your iron pills.  To prevent trouble pooping (constipation), eat fiber or take medicine (stool softener) as told by your doctor. Eating and drinking   Talk with your doctor before changing the foods you eat. He or she may tell you to eat foods that have a lot of iron, such as: ? Liver. ? Lowfat (lean) beef. ? Breads and cereals that have iron added to them (fortified breads and cereals). ? Eggs. ? Dried fruit. ? Dark green, leafy vegetables.  Drink enough fluid to keep your pee (urine) clear or pale yellow.  Eat fresh fruits and vegetables that are high in vitamin C. They help your body to use iron.  Foods with a lot of vitamin C include: ? Oranges. ? Peppers. ? Tomatoes. ? Mangoes. General instructions  Return to your normal activities as told by your doctor. Ask your doctor what activities are safe for you.  Keep yourself clean, and keep things clean around you (your surroundings). Anemia can make you get sick more easily.  Keep all follow-up visits as told by your doctor. This is important. Contact a doctor if:  You feel sick to your stomach (nauseous).  You throw up (vomit).  You feel weak.  You are sweating for no clear reason.  You have trouble pooping, such as: ? Pooping (having a bowel movement) less than 3 times a week. ? Straining to poop. ? Having poop that is hard, dry, or larger than normal. ? Feeling full or bloated. ? Pain in the lower belly. ? Not feeling better after pooping. Get help right away if:  You pass out (faint). If this happens, do not drive yourself to the hospital. Call your local emergency services (911 in the [place]  You have chest pain.  You have shortness of breath that: ? Is very bad. ? Gets worse with physical activity.  You have a fast heartbeat.  You get light-headed when getting up from sitting or lying down. This information is not intended to replace advice given to you by your health care provider. Make sure you discuss any questions you have with  your health care provider. Document Released: 05/07/2010 Document Revised: 03/17/2017 Document Reviewed: 12/23/2015 Elsevier Patient Education  2020 [employer].

## 2019-03-01 NOTE — Progress Notes (Signed)
CC: Iron deficiency anemia  HPI: [name] is a 83 y.o. F w/ PMH of T9,T12 compression fractures, GERD, HTN, HLD who presents w/ iron defiency anemia. She was seen in clinic earlier this month by her PCP and was found to have low hgb and ferritin. She was seen for follow up last week with findings of stable microcytic anemia and improvement in her AKI. She is seen today in [facility] due to concerns for hypotension. [name] denies any chest pain, palpitations, dyspnea, light-headedness or fatigue. She is complaining of desire for ice-chewing, muscle cramps.   Past Medical History:  Diagnosis Date  . Allergy   . Chronic low back pain   . Compression fracture    T9 and T12 noted 05/2012 imaging. Followed by pain clinic   . Fall    Jan or Feb 2014   . Gastritis   . GERD (gastroesophageal reflux disease)   . Insomnia   . Irregular heart rate   . Osteoporosis   . Peptic ulcer disease   . Rib pain   . Right wrist fracture    3 - 4 monthes ago    Review of Systems: Review of Systems  Constitutional: Negative for chills, fever and malaise/fatigue.  Respiratory: Negative for shortness of breath.   Cardiovascular: Negative for chest pain.  Gastrointestinal: Negative for constipation, diarrhea, melena, nausea and vomiting.  Genitourinary: Negative for dysuria and frequency.  Neurological: Negative for dizziness and headaches.  All other systems reviewed and are negative.    Physical Exam: Vitals:   03/01/19 0948 03/01/19 1017  BP: (!) 103/59 112/82  Pulse: 88   SpO2: 98%     Physical Exam  Constitutional: She is oriented to person, place, and time. She appears well-developed. No distress.  Thin-appearing  HENT:  Mouth/Throat: Oropharynx is clear and moist.  Neck: Normal range of motion. Neck supple.  Respiratory: Effort normal and breath sounds normal. She has no wheezes. She has no rales.  GI: Soft. Bowel sounds are normal. She exhibits no distension. There is no abdominal  tenderness.  Musculoskeletal: Normal range of motion.        General: No edema.  Neurological: She is alert and oriented to person, place, and time.  Skin: Skin is warm and dry.    Assessment & Plan:   Chronic, continuous use of opioids Mentions that she was instructed to take additional tramadol rather than her aspirin at home for her chronic pain to prevent further GI bleed and worsening OTC pain med use. States pharmacy informed her they cannot dispense further med as instructed. Discussed with Dr.Santos-Sanchez who saw her last week. Confirmed she needs new prescription with updated frequency.   - New paper script provided w/ updated frequency for tramadol  Iron deficiency anemia Lab Results  Component Value Date   WBC 4.6 03/01/2019   HGB 8.6 (L) 03/01/2019   HCT 27.9 (L) 03/01/2019   MCV 79.7 (L) 03/01/2019   PLT 253 03/01/2019   Presents for f/u visit for iron deficiency anemia. Cbc checked this am shows improving anemia w/ hgb from 8.4->8.6. Discussed in detail regarding need to monitor bowel movement and to avoid Excedrin (aspirin) use to prevent GI bleed as she did have hx of PUD. Her daughter and she mentions that they would like to avoid further invasive GI work-up due to significant discomfort after prior EGD.   - Iron infusion ordered and scheduled for short stay unit 03/04/19 - Refilled iron supplement - F/u with PCP -  C/w famotidine    Patient discussed with Dr. Dareen Piano   -Gilberto Better, PGY2 [facility] Pager: [phone]

## 2019-03-04 ENCOUNTER — Other Ambulatory Visit: Payer: Self-pay

## 2019-03-04 ENCOUNTER — Ambulatory Visit (HOSPITAL_COMMUNITY)
Admission: RE | Admit: 2019-03-04 | Discharge: 2019-03-04 | Disposition: A | Discharge status: Home / Self Care | Payer: Medicare Other | Source: Ambulatory Visit | Attending: Internal Medicine | Admitting: Internal Medicine

## 2019-03-04 DIAGNOSIS — D509 Iron deficiency anemia, unspecified: Secondary | ICD-10-CM | POA: Insufficient documentation

## 2019-03-04 MED ORDER — SODIUM CHLORIDE 0.9 % IV SOLN
510.0000 mg | INTRAVENOUS | Status: DC
  Administered 2019-03-04: 510 mg via INTRAVENOUS
  Filled 2019-03-04: qty 17

## 2019-03-04 NOTE — Discharge Instructions (Signed)

## 2019-03-04 NOTE — Progress Notes (Signed)
All information relayed through [place] interpreter using [employer] video.

## 2019-03-06 NOTE — Progress Notes (Signed)
Internal Medicine Clinic Attending ° °Case discussed with Dr. Pereyra at the time of the visit.  We reviewed the resident’s history and exam and pertinent patient test results.  I agree with the assessment, diagnosis, and plan of care documented in the resident’s note.  °

## 2019-03-11 ENCOUNTER — Ambulatory Visit (HOSPITAL_COMMUNITY)
Admission: RE | Admit: 2019-03-11 | Discharge: 2019-03-11 | Disposition: A | Discharge status: Home / Self Care | Payer: Medicare Other | Source: Ambulatory Visit | Attending: Internal Medicine | Admitting: Internal Medicine

## 2019-03-11 ENCOUNTER — Other Ambulatory Visit: Payer: Self-pay

## 2019-03-11 DIAGNOSIS — D509 Iron deficiency anemia, unspecified: Secondary | ICD-10-CM | POA: Diagnosis not present

## 2019-03-11 MED ORDER — SODIUM CHLORIDE 0.9 % IV SOLN
510.0000 mg | INTRAVENOUS | Status: AC
  Administered 2019-03-11: 510 mg via INTRAVENOUS
  Filled 2019-03-11: qty 17

## 2019-03-25 ENCOUNTER — Other Ambulatory Visit: Payer: Self-pay | Admitting: Internal Medicine

## 2019-03-25 DIAGNOSIS — F1190 Opioid use, unspecified, uncomplicated: Secondary | ICD-10-CM

## 2019-03-26 ENCOUNTER — Telehealth: Payer: Self-pay | Admitting: Internal Medicine

## 2019-03-26 MED ORDER — TRAMADOL HCL 50 MG PO TABS: 50.0000 mg | ORAL_TABLET | Freq: Two times a day (BID) | ORAL | 0 refills | Status: DC | PRN

## 2019-03-26 NOTE — Telephone Encounter (Signed)
[facility] - stated Tramadol was last refilled on 11/22 #60 which is a 20 day supply which is for 1 tab every 8 hrs.  And the 2 scripts on file is 1 tab twice a day. And pt's daughter was told she can pick up next refill on Thursday 12/10 which will be 2 days early.

## 2019-03-26 NOTE — Telephone Encounter (Signed)
I have called pt's daughter; see previous today's encounter

## 2019-03-26 NOTE — Telephone Encounter (Signed)
I see what happened. She was seen by Dr. Frederico Hamman on 11/06 for GI bleed and she was given a short term prescription for tramadol for acute pain. This was suppose to be in addition to her chronic BID dosing. Looks like from Dr. Marguerita Beards note, her pharmacy would not fill the additional prescription. Likely they did not realize it was for acute pain. So Dr. Truman Hayward increased her frequency and sent a new prescription for the 20 days. Patient needs to go back to BID dosing. I will send a new prescription today with correct instructions. Please have the pharmacy cancel any remaining prescriptions they have on file. Thank you!

## 2019-03-26 NOTE — Telephone Encounter (Signed)
Last rx written 03/01/19. Last OV 11/13 with Dr Truman Hayward. UDS 10/01/18

## 2019-03-26 NOTE — Telephone Encounter (Signed)
Refill for tramadol declined. Three prescriptions were sent last month to be filled November, December, and January. She last filled on 11/22 per database - this was before her "do not fill before" date of 11/26. Unclear why pharmacy filled early, but regardless she should not be due for a refill until at least 12/22. Can you please ask patient if she is out of her medication and why she is requesting refills early? Her December and January prescription should be on file with her pharmacy and she should contact them for refills.

## 2019-03-26 NOTE — Telephone Encounter (Signed)
Called pt's daughter Jernee Murtaugh - no answer; unable to leave message "call cannot be completed".

## 2019-03-26 NOTE — Telephone Encounter (Signed)
Pt missed a call pls return call [phone]

## 2019-03-26 NOTE — Addendum Note (Signed)
Addended by: Jodean Lima on: 03/26/2019 02:08 PM   Modules accepted: Orders

## 2019-03-26 NOTE — Telephone Encounter (Signed)
[facility] called - message left to cancel remaining rxs on file and Dr Philipp Ovens has sent the correct one for BID instructions; and call for any questions.

## 2019-04-25 ENCOUNTER — Telehealth: Payer: Self-pay | Admitting: Internal Medicine

## 2019-04-25 NOTE — Telephone Encounter (Signed)
Called daughter. Reports her mom is due for her osteoporosis injection in February but has not been able to get in touch with Dr. Selena Batten. She receives her denosumab injections here in clinic. Can we please help coordinate this appointment with pharmacy? Please have her schedule an appointment we me same time if possible. Looks like I have availability in Feb. If possible I would prefer a 1 hour appointment. Thank you!

## 2019-04-25 NOTE — Telephone Encounter (Signed)
Please call patient's daughter back. 

## 2019-04-28 ENCOUNTER — Other Ambulatory Visit: Payer: Self-pay | Admitting: Internal Medicine

## 2019-04-28 DIAGNOSIS — F1190 Opioid use, unspecified, uncomplicated: Secondary | ICD-10-CM

## 2019-05-03 NOTE — Telephone Encounter (Signed)
Pt has been sch an appt for 05/29/2019 @ 9:45 am.

## 2019-05-10 ENCOUNTER — Telehealth: Payer: Self-pay | Admitting: Internal Medicine

## 2019-05-10 DIAGNOSIS — M810 Age-related osteoporosis without current pathological fracture: Secondary | ICD-10-CM

## 2019-05-10 MED ORDER — PROLIA 60 MG/ML ~~LOC~~ SOSY: 60.0000 mg | PREFILLED_SYRINGE | SUBCUTANEOUS | 0 refills | Status: AC

## 2019-05-10 NOTE — Telephone Encounter (Signed)
Phone rings and rings, no vmail

## 2019-05-10 NOTE — Telephone Encounter (Signed)
Called pt's daughter back and confirmed she could bring her @ 9:15 am instead of 9:45 am per PCP request for an 1hr visit on 05/29/2019.

## 2019-05-10 NOTE — Telephone Encounter (Signed)
Confirmed with patient's daughter the [facility] is correct.  Pt's daughter also notified of PCP appt with Dr. Antony Contras on 05/29/2019 @ 9:45 am.  Pt's daughter verbalized she understood to bring the patient's medication to this visit.

## 2019-05-10 NOTE — Telephone Encounter (Signed)
Patient to receive Prolia injection at next appt with PCP (05/29/2019). Will need refill sent to [facility] so daughter can pick this up and bring with her to this appt. Kinnie Feil, BSN, RN-BC

## 2019-05-10 NOTE — Telephone Encounter (Signed)
Pt daughter is requesting a callback regarding her mother [phone]

## 2019-05-10 NOTE — Telephone Encounter (Signed)
Prescription sent

## 2019-05-13 ENCOUNTER — Telehealth: Payer: Self-pay | Admitting: Gastroenterology

## 2019-05-13 ENCOUNTER — Telehealth: Payer: Self-pay | Admitting: Internal Medicine

## 2019-05-13 NOTE — Telephone Encounter (Signed)
Pt contact pt daughter [phone]

## 2019-05-13 NOTE — Telephone Encounter (Signed)
I agree... Thank you Helen 

## 2019-05-13 NOTE — Telephone Encounter (Signed)
Pt's daughter calls and states the famotidine is not working and pt will not eat much. Could you give her something stronger for the acid reflux? Has appt 2/10

## 2019-05-13 NOTE — Telephone Encounter (Signed)
Spoke to daughter again and gave her dr [employer]. She does not want to change if there is possibility of problems. Ideas to try until next appt 2/10  Went over dietary changes that may help: Smaller meals No liquids 30 mins before, during meals and wait 30 mins after meals to have liquids No spicy foods Wait 1 hr to lie down after meal No fizzy drinks Cut back on sugary greasy food Try ginger tea to sip on thruout day  Do you agree?

## 2019-05-13 NOTE — Telephone Encounter (Signed)
Can you please call the pt and schedule an office visit?  We do not schedule direct EGD.  Thank you

## 2019-05-13 NOTE — Telephone Encounter (Signed)
Please make sure patient is not taking Excedrin. She has been counseled multiple times in the past to stop this. It has aspirin in it and can make her GERD worse. As far as other treatments, we can try another medication like Protonix but this can make her osteoporosis worse and might increase her fracture risk. The best thing is for her to avoid food triggers and Excedrin if she has started this again. If the daughter wants to try Protonix I will send a prescriptions but wanted to make her aware of the risks.

## 2019-05-28 MED FILL — PROLIA 60 MG/ML SOLN: 60 | 180 days supply | Qty: 1 | Fill #0 | Status: TO

## 2019-05-29 ENCOUNTER — Encounter: Payer: Medicare Other | Admitting: Internal Medicine

## 2019-05-29 MED FILL — PROLIA 60 MG/ML SOLN: 60 | 180 days supply | Qty: 1 | Fill #0

## 2019-06-09 ENCOUNTER — Other Ambulatory Visit: Payer: Self-pay | Admitting: Internal Medicine

## 2019-06-09 DIAGNOSIS — F1190 Opioid use, unspecified, uncomplicated: Secondary | ICD-10-CM

## 2019-06-10 NOTE — Telephone Encounter (Signed)
traMADol (ULTRAM) 50 MG tablet, refill request @  [facility] [place], [place] - [address] [phone] (Phone) [phone] (Fax)

## 2019-06-10 NOTE — Telephone Encounter (Signed)
Approved 1 month refill of tramadol. Reviewed database, refill history is appropriate. Last UDS appropriate as well. She has follow up scheduled with me on 2/24. Plan to repeat UDS at that visit.

## 2019-06-12 ENCOUNTER — Ambulatory Visit (INDEPENDENT_AMBULATORY_CARE_PROVIDER_SITE_OTHER): Payer: Medicare Other | Admitting: Internal Medicine

## 2019-06-12 ENCOUNTER — Other Ambulatory Visit: Payer: Self-pay

## 2019-06-12 ENCOUNTER — Encounter: Payer: Self-pay | Admitting: Internal Medicine

## 2019-06-12 VITALS — BP 126/73 | HR 92 | Temp 98.5°F | Ht 59.5 in | Wt 85.0 lb

## 2019-06-12 DIAGNOSIS — R42 Dizziness and giddiness: Secondary | ICD-10-CM | POA: Diagnosis not present

## 2019-06-12 DIAGNOSIS — K2970 Gastritis, unspecified, without bleeding: Secondary | ICD-10-CM

## 2019-06-12 DIAGNOSIS — M810 Age-related osteoporosis without current pathological fracture: Secondary | ICD-10-CM | POA: Diagnosis not present

## 2019-06-12 DIAGNOSIS — E559 Vitamin D deficiency, unspecified: Secondary | ICD-10-CM | POA: Diagnosis not present

## 2019-06-12 DIAGNOSIS — F1190 Opioid use, unspecified, uncomplicated: Secondary | ICD-10-CM | POA: Diagnosis not present

## 2019-06-12 DIAGNOSIS — G8929 Other chronic pain: Secondary | ICD-10-CM | POA: Diagnosis not present

## 2019-06-12 DIAGNOSIS — D509 Iron deficiency anemia, unspecified: Secondary | ICD-10-CM

## 2019-06-12 DIAGNOSIS — N1832 Chronic kidney disease, stage 3b: Secondary | ICD-10-CM

## 2019-06-12 DIAGNOSIS — Z7189 Other specified counseling: Secondary | ICD-10-CM | POA: Diagnosis not present

## 2019-06-12 LAB — BASIC METABOLIC PANEL
Anion gap: 7 (ref 5–15)
BUN: 21 mg/dL (ref 8–23)
CO2: 27 mmol/L (ref 22–32)
Calcium: 8.8 mg/dL — ABNORMAL LOW (ref 8.9–10.3)
Chloride: 107 mmol/L (ref 98–111)
Creatinine, Ser: 1 mg/dL (ref 0.44–1.00)
GFR calc Af Amer: >60 mL/min (ref >60)
GFR calc non Af Amer: 53 mL/min — ABNORMAL LOW (ref >60)
Glucose, Bld: 172 mg/dL — ABNORMAL HIGH (ref 70–99)
Potassium: 4.4 mmol/L (ref 3.5–5.1)
Sodium: 141 mmol/L (ref 135–145)

## 2019-06-12 LAB — CBC
HCT: 31.6 % — ABNORMAL LOW (ref 36.0–46.0)
Hemoglobin: 9.9 g/dL — ABNORMAL LOW (ref 12.0–15.0)
MCH: 30.9 pg (ref 26.0–34.0)
MCHC: 31.3 g/dL (ref 30.0–36.0)
MCV: 98.8 fL (ref 80.0–100.0)
Platelets: 221 10*3/uL (ref 150–400)
RBC: 3.2 MIL/uL — ABNORMAL LOW (ref 3.87–5.11)
RDW: 13.9 % (ref 11.5–15.5)
WBC: 5.2 10*3/uL (ref 4.0–10.5)
nRBC: 0 % (ref 0.0–0.2)

## 2019-06-12 LAB — FERRITIN: Ferritin: 194 ng/mL (ref 11–307)

## 2019-06-12 LAB — VITAMIN D 25 HYDROXY (VIT D DEFICIENCY, FRACTURES): Vit D, 25-Hydroxy: 94.95 ng/mL (ref 30–100)

## 2019-06-12 MED ORDER — SODIUM CHLORIDE 0.9 % IV BOLUS
500.0000 mL | Freq: Once | INTRAVENOUS | Status: AC
  Administered 2019-06-12: 11:00:00 500 mL via INTRAVENOUS

## 2019-06-12 MED ORDER — TRAMADOL HCL 50 MG PO TABS: 50.0000 mg | ORAL_TABLET | Freq: Two times a day (BID) | ORAL | 0 refills | Status: DC | PRN

## 2019-06-12 MED ORDER — TRAMADOL HCL 50 MG PO TABS: 50.0000 mg | ORAL_TABLET | Freq: Two times a day (BID) | ORAL | 0 refills | Status: AC | PRN

## 2019-06-12 MED ORDER — DENOSUMAB 60 MG/ML ~~LOC~~ SOSY
60.0000 mg | PREFILLED_SYRINGE | Freq: Once | SUBCUTANEOUS | Status: AC
  Administered 2019-06-12: 11:00:00 60 mg via SUBCUTANEOUS

## 2019-06-12 MED ORDER — CALCIUM CARBONATE ANTACID 500 MG PO CHEW: 1.0000 | CHEWABLE_TABLET | Freq: Two times a day (BID) | ORAL | 2 refills | Status: DC

## 2019-06-12 NOTE — Patient Instructions (Addendum)
Allison Rojas,  It was a pleasure to see you today. Please keep your appointment with GI on 3/2. You may continue to take your medicines as previously prescribed. In addition to famotidine, you may take tums twice a day as needed.   Follow up with me again in 3 months or sooner if you have any problems. If you have any questions or concerns, call our clinic at [phone] or after hours call [phone] and ask for the internal medicine resident on call. Thank you!  Dr. Antony Contras

## 2019-06-12 NOTE — Progress Notes (Signed)
Subjective:   Patient ID: Allison Rojas female   DOB: 06/18/1935 84 y.o.   MRN: [id number]  HPI: Allison Rojas is a 84 y.o. female with past medical history outlined below here for follow up of IDA. For the details of today's visit, please refer to the assessment and plan.   Past Medical History:  Diagnosis Date  . Allergy   . Chronic low back pain   . Compression fracture    T9 and T12 noted 05/2012 imaging. Followed by pain clinic   . Fall    Jan or Feb 2014   . Gastritis   . GERD (gastroesophageal reflux disease)   . Insomnia   . Irregular heart rate   . Osteoporosis   . Peptic ulcer disease   . Rib pain   . Right wrist fracture    3 - 4 monthes ago   Current Outpatient Medications  Medication Sig Dispense Refill  . acetaminophen (TYLENOL) 500 MG tablet Take 1 tablet (500 mg total) by mouth 3 (three) times daily. 30 tablet 0  . amitriptyline (ELAVIL) 50 MG tablet Take 1 tablet (50 mg total) by mouth at bedtime. 30 tablet 5  . calcium-vitamin D (OSCAL WITH D) 500-200 MG-UNIT tablet Take 1 tablet by mouth 2 (two) times daily. 60 tablet 0  . diclofenac sodium (VOLTAREN) 1 % GEL Apply 4 g topically 4 (four) times daily. 100 g 0  . famotidine (PEPCID) 20 MG tablet TAKE ONE TABLET BY MOUTH TWO TIMES A DAY AS NEEDED FOR HEARTBURN OR INDIGESTION 60 tablet 5  . ferrous sulfate 325 (65 FE) MG tablet Take 1 tablet (325 mg total) by mouth daily with breakfast. 30 tablet 3  . fluticasone (FLONASE) 50 MCG/ACT nasal spray Place 2 sprays into both nostrils daily as needed for rhinitis. For allergies 16 g 5  . PROLIA 60 MG/ML SOSY injection Inject 60 mg into the skin every 6 (six) months. 1.8 mL 0  . traMADol (ULTRAM) 50 MG tablet TAKE ONE TABLET BY MOUTH TWICE A DAY AS NEEDED FOR MODERATE OR SEVERE PAIN 60 tablet 0   No current facility-administered medications for this visit.   Family History  Problem Relation Age of Onset  . Colon cancer Neg Hx   . Esophageal cancer Neg Hx   . Rectal  cancer Neg Hx   . Stomach cancer Neg Hx    Social History   Socioeconomic History  . Marital status: Widowed    Spouse name: Not on file  . Number of children: 1  . Years of education: Not on file  . Highest education level: Not on file  Occupational History  . Not on file  Tobacco Use  . Smoking status: Never Smoker  . Smokeless tobacco: Never Used  Substance and Sexual Activity  . Alcohol use: No  . Drug use: No  . Sexual activity: Never  Other Topics Concern  . Not on file  Social History Narrative   Financial assistance approved for 100% discount at [facility] and has [facility] card per [name]   11/30/2009   She is widowed, she has 2 children (only admits to 1 daughter now 07/2012), she does not smoke cigarettes or drink alcohol   Lives with daughter Ming Kunka [phone]               Social Determinants of Health   Financial Resource Strain:   . Difficulty of Paying Living Expenses: Not on file  Food Insecurity:   .  Worried About Charity fundraiser in the Last Year: Not on file  . Ran Out of Food in the Last Year: Not on file  Transportation Needs:   . Lack of Transportation (Medical): Not on file  . Lack of Transportation (Non-Medical): Not on file  Physical Activity:   . Days of Exercise per Week: Not on file  . Minutes of Exercise per Session: Not on file  Stress:   . Feeling of Stress : Not on file  Social Connections:   . Frequency of Communication with Friends and Family: Not on file  . Frequency of Social Gatherings with Friends and Family: Not on file  . Attends Religious Services: Not on file  . Active Member of Clubs or Organizations: Not on file  . Attends Archivist Meetings: Not on file  . Marital Status: Not on file    Review of Systems: Review of Systems  Gastrointestinal: Positive for heartburn.  Neurological: Positive for dizziness.     Objective:  Physical Exam:  Vitals:   06/12/19 0846  Weight: 85 lb (38.6 kg)     Physical Exam  Constitutional: She is oriented to person, place, and time.  Cachetic, malnourished   Cardiovascular: Normal rate, regular rhythm and normal heart sounds. Exam reveals no gallop and no friction rub.  No murmur heard. Pulmonary/Chest: Effort normal and breath sounds normal. No respiratory distress.  Abdominal: Soft. She exhibits no distension.  Neurological: She is alert and oriented to person, place, and time.     Assessment & Plan:   See Encounters Tab for problem based charting.

## 2019-06-12 NOTE — Assessment & Plan Note (Signed)
Prolia injection given today in clinic. Repeat 6 months.

## 2019-06-12 NOTE — Assessment & Plan Note (Addendum)
Patient is complaining of ongoing uncontrolled GERD symptoms to the point where she has been unable to take some of her medications. Famotidine helps somewhat but her symptoms are still present. I have been hesitant to start a PPI with her severe osteoporosis and malnutrition. She has a history of spinal compression fracture, currently on prolia injections. She has known iron deficiency anemia s/p iron infusion on 03/04/19. I suspect 2/2 to an upper GI source however she has previously declined GI follow up and upper endoscopy. She has a history of gastritis seen on EGD in 2015, biopsies at that time were negative for H Pylori. She has a follow up appointment scheduled with GI on 3/2. She now tells me that she is willing to undergo EGD if needed. Unfortunately she is also endorsing dizziness, and orthostatics today are positive (SBP sitting 126 -> standing 102 and symptomatic). Giving 500 cc bolus here in clinic, checking STAT labs. Pending results, will plan having her follow up with GI next week vs. Admission.   ADDENDUM: CBC with improved hemoglobin to 9.9. Ferritin increased as well to 194. Suspect her orthostasis is due to poor po intake rather than worsening IDA. Encouraged her to increase fluid intake at home. Ok to take tums as well in addition to famotidine. Instructed patient to keep her appointment with GI on 3/2.

## 2019-06-12 NOTE — Assessment & Plan Note (Signed)
Goals of care conversation was initiated today. Patient is an 84 yo F with severe malnutrition and osteoporosis as well as iron deficiency anemia and gastritis. In the past, she has consistently declined aggressive work up and invasive procedures until today's visit, when she told me she is willing to undergo EGD due to her worsening GERD symptoms. She has had poor po intake as a result and has been unable to take some of her medications. I introduced the concept of palliative care and patient and daughter are agreeable to a consult. For now they wish to continue with aggressive care, but have previously expressed goals more consistent with comfort. We did not address code status today. She has follow up scheduled with GI next week. Palliative care consult has been placed.

## 2019-06-12 NOTE — Assessment & Plan Note (Signed)
Repeat BMP today with improved renal function. Continue to monitor.

## 2019-06-12 NOTE — Assessment & Plan Note (Addendum)
Patient has chronic pain and is prescribed Tramadol 50 mg BID. Her pain contract was renewed at her last visit. Database reviewed, refill history has been appropriate. 1 month refill sent in yesterday. Additional 2 months sent in today. Repeating Utox today.

## 2019-06-13 NOTE — Assessment & Plan Note (Signed)
Vit D WNL. Continue current supplements.

## 2019-06-15 LAB — TOXASSURE SELECT,+ANTIDEPR,UR

## 2019-06-17 ENCOUNTER — Telehealth: Payer: Self-pay | Admitting: *Deleted

## 2019-06-17 NOTE — Telephone Encounter (Signed)
Received referral to Palliative Care Services. Patient is on the [facility] banner. Call placed to [facility].  No answer. Left message on VM requesting return call. Kinnie Feil, RN, BSN

## 2019-06-18 ENCOUNTER — Encounter: Payer: Self-pay | Admitting: Gastroenterology

## 2019-06-18 ENCOUNTER — Ambulatory Visit (INDEPENDENT_AMBULATORY_CARE_PROVIDER_SITE_OTHER): Payer: Medicare Other | Admitting: Gastroenterology

## 2019-06-18 ENCOUNTER — Other Ambulatory Visit: Payer: Self-pay

## 2019-06-18 VITALS — BP 90/60 | HR 84 | Temp 98.2°F | Ht 59.25 in | Wt 85.4 lb

## 2019-06-18 DIAGNOSIS — Z01818 Encounter for other preprocedural examination: Secondary | ICD-10-CM

## 2019-06-18 DIAGNOSIS — R1013 Epigastric pain: Secondary | ICD-10-CM

## 2019-06-18 DIAGNOSIS — D509 Iron deficiency anemia, unspecified: Secondary | ICD-10-CM | POA: Diagnosis not present

## 2019-06-18 NOTE — Progress Notes (Signed)
iew of pertinent gastrointestinal problems: 1. Moderate gastritis (EGD , Ardis Hughs 05/2010), biopsies showed no h. Pylori. Was taking ASA, NSAID. Previous ulcer noted in [place] per family. EGD 05/2013 Dr. Ardis Hughs pan-gastritis, [facility] without Cameron's erosions, tortuous esophagus.   HPI: This is a pleasant 84 year old [place] speaking only woman.  She is here with her daughter who also serves as a Optometrist.   I last saw her September 2019.  She was losing some weight.  She was taking Excedrin 2 pills daily for many months.  She was having some epigastric pains as well.  She had never had colon cancer screening that she is aware of.  She told me that she was "having a problem with digestion".  She could not be more specific than that.  She did admits that she took a lot of medicines from [place] and she was not sure exactly what was in them.  At that time September 2019 we arrange a CT scan of her abdomen and pelvis as well as blood work.  Those tests were unrevealing except for possible excessive stool burden so I recommended she try fiber supplements on a daily basis.  We have not heard from her since.  She has recently found to have iron deficiency anemia.  Blood work recently shows hemoglobin anywhere from 8.4-9.9.  Slightly low MCV.  Low ferritin in November 2020.  Since then she has received 2 iron infusions  She tells me that she is having a lot of epigastric discomforts.  Also cramping in her legs.  They understand that she was also found to have worsening anemia, iron deficiency.  She has not had any overt GI bleeding.  Specifically no vomiting blood, no melena or frankly bloody stools.  Her weight is overall stable.   ROS: complete GI ROS as described in HPI, all other review negative.  Constitutional:  No unintentional weight loss   Past Medical History:  Diagnosis Date  . Allergy   . Chronic low back pain   . Compression fracture    T9 and T12 noted 05/2012 imaging. Followed by pain  clinic   . Fall    Jan or Feb 2014   . Gastritis   . GERD (gastroesophageal reflux disease)   . Insomnia   . Irregular heart rate   . Osteoporosis   . Peptic ulcer disease   . Rib pain   . Right wrist fracture    3 - 4 monthes ago    Past Surgical History:  Procedure Laterality Date  . NO PAST SURGERIES      Current Outpatient Medications  Medication Sig Dispense Refill  . acetaminophen (TYLENOL) 500 MG tablet Take 1 tablet (500 mg total) by mouth 3 (three) times daily. 30 tablet 0  . amitriptyline (ELAVIL) 50 MG tablet Take 1 tablet (50 mg total) by mouth at bedtime. 30 tablet 5  . diclofenac sodium (VOLTAREN) 1 % GEL Apply 4 g topically 4 (four) times daily. 100 g 0  . famotidine (PEPCID) 20 MG tablet TAKE ONE TABLET BY MOUTH TWO TIMES A DAY AS NEEDED FOR HEARTBURN OR INDIGESTION 60 tablet 5  . ferrous sulfate 325 (65 FE) MG tablet Take 1 tablet (325 mg total) by mouth daily with breakfast. 30 tablet 3  . fluticasone (FLONASE) 50 MCG/ACT nasal spray Place 2 sprays into both nostrils daily as needed for rhinitis. For allergies 16 g 5  . PROLIA 60 MG/ML SOSY injection Inject 60 mg into the skin every 6 (six) months.  1.8 mL 0  . traMADol (ULTRAM) 50 MG tablet Take 1 tablet (50 mg total) by mouth 2 (two) times daily as needed for moderate pain or severe pain. 60 tablet 0   No current facility-administered medications for this visit.    Allergies as of 06/18/2019 - Review Complete 06/18/2019  Allergen Reaction Noted  . Amitriptyline  08/10/2012    Family History  Problem Relation Age of Onset  . Colon cancer Neg Hx   . Esophageal cancer Neg Hx   . Rectal cancer Neg Hx   . Stomach cancer Neg Hx     Social History   Socioeconomic History  . Marital status: Widowed    Spouse name: Not on file  . Number of children: 1  . Years of education: Not on file  . Highest education level: Not on file  Occupational History  . Not on file  Tobacco Use  . Smoking status: Never  Smoker  . Smokeless tobacco: Never Used  Substance and Sexual Activity  . Alcohol use: No  . Drug use: No  . Sexual activity: Never  Other Topics Concern  . Not on file  Social History Narrative   Financial assistance approved for 100% discount at [facility] and has [facility] card per [name]   11/30/2009   She is widowed, she has 2 children (only admits to 1 daughter now 07/2012), she does not smoke cigarettes or drink alcohol   Lives with daughter Keymiah Lyles [phone]               Social Determinants of Health   Financial Resource Strain:   . Difficulty of Paying Living Expenses: Not on file  Food Insecurity:   . Worried About Programme researcher, broadcasting/film/video in the Last Year: Not on file  . Ran Out of Food in the Last Year: Not on file  Transportation Needs:   . Lack of Transportation (Medical): Not on file  . Lack of Transportation (Non-Medical): Not on file  Physical Activity:   . Days of Exercise per Week: Not on file  . Minutes of Exercise per Session: Not on file  Stress:   . Feeling of Stress : Not on file  Social Connections:   . Frequency of Communication with Friends and Family: Not on file  . Frequency of Social Gatherings with Friends and Family: Not on file  . Attends Religious Services: Not on file  . Active Member of Clubs or Organizations: Not on file  . Attends Banker Meetings: Not on file  . Marital Status: Not on file  Intimate Partner Violence:   . Fear of Current or Ex-Partner: Not on file  . Emotionally Abused: Not on file  . Physically Abused: Not on file  . Sexually Abused: Not on file     Physical Exam: BP 90/60   Pulse 84   Temp 98.2 F (36.8 C)   Ht 4' 11.25" (1.505 m)   Wt 85 lb 6.4 oz (38.7 kg)   BMI 17.10 kg/m  Constitutional: generally well-appearing Psychiatric: alert and oriented x3 Abdomen: soft, nontender, nondistended, no obvious ascites, no peritoneal signs, normal bowel sounds No peripheral edema noted in lower  extremities  Assessment and plan: 84 y.o. female with epigastric pains, iron deficiency anemia  I explained to her and her daughter that standard of care work-up for iron deficiency anemia such as hers starts with a colonoscopy.  Her daughter does not think that she could undergo that although  she is quite frail I think that we probably could get her through it safely.  Either way neither she nor her daughter will consent to it but they did agree to an upper endoscopy.  She was previously taking some NSAIDs as I think it is a reasonable place to start.  They both understand that if no clear explanation is found for her iron deficiency anemia or abdominal pains in her upper GI tract that I would likely again recommend a colonoscopy.  Please see the "Patient Instructions" section for addition details about the plan.  Rob Bunting, MD [facility] 06/18/2019, 11:26 AM   Total time on date of encounter was 31 minutes (this included time spent preparing to see the patient reviewing records; obtaining and/or reviewing separately obtained history; performing a medically appropriate exam and/or evaluation; counseling and educating the patient and family if present; ordering medications, tests or procedures if applicable; and documenting clinical information in the health record).

## 2019-06-18 NOTE — Telephone Encounter (Signed)
Spoke with Allison Rojas at [facility] [place]. She is able to accept patient for Palliative Care services. She will contact patient's daughter, Felisia Balcom, and will notify [facility] with date of enrollment. Kinnie Feil, BSN, RN-BC

## 2019-06-18 NOTE — Telephone Encounter (Signed)
Thank you :)

## 2019-06-18 NOTE — Patient Instructions (Signed)
If you are age 82 or older, your body mass index should be between 23-30. Your Body mass index is 17.1 kg/m. If this is out of the aforementioned range listed, please consider follow up with your Primary Care Provider.  If you are age 64 or younger, your body mass index should be between 19-25. Your Body mass index is 17.1 kg/m. If this is out of the aformentioned range listed, please consider follow up with your Primary Care Provider.   You have been scheduled for an endoscopy. Please follow written instructions given to you at your visit today. If you use inhalers (even only as needed), please bring them with you on the day of your procedure.  Thank you, Dr [name]

## 2019-06-20 ENCOUNTER — Telehealth: Payer: Self-pay

## 2019-06-20 NOTE — Telephone Encounter (Signed)
RTC to Tula Nakayama (EC/daughter) to see if RN could assist/ answer any questions, etc.  EC states she wants to speak to Dr. Nedra Hai.  RN informs EC that Dr. Antony Contras is listed as patients PCP and asked EC if she would like to speak to PCP?  EC/daughter states she is aware Dr. Antony Contras is PCP, but only wants to speak to Dr. Nedra Hai. Will forward to Dr. Nedra Hai and Dr. Antony Contras. SChaplin, RN,BSN

## 2019-06-20 NOTE — Telephone Encounter (Signed)
Spoke with Mrs.Causey's daughter, Clark Clowdus regarding Mrs.Gundrum's prognosis. Discussed results of most recent labs with Dr.Guilloud. She expressed concerns regarding possible recurrence of her iron deficiency and worries regarding upcoming EGD. Re-explained most recent note from Dr.Jacobs regarding indication and need for procedure to identify source of bleed and reassured patient regarding safety of the procedure. Ms.Colaizzi expressed understanding.

## 2019-06-20 NOTE — Telephone Encounter (Signed)
Requesting to speak with Dr. Halliwell, please call pt back.  

## 2019-06-20 NOTE — Telephone Encounter (Signed)
I'll give them a call back. Thank you.

## 2019-06-27 NOTE — Telephone Encounter (Signed)
Call placed to Dallas Breeding with [facility] for Palliative Care services to learn [facility] date. Cherie states that they have left messages via [place] interpreter for daughter Renad Jenniges to enroll patient but have yet to receive return call. Cherie will contact JoAnne to reach out again. Second number [phone]) provided to [facility]. Kinnie Feil, BSN, RN-BC

## 2019-06-28 ENCOUNTER — Ambulatory Visit: Payer: Medicare Other | Admitting: Gastroenterology

## 2019-07-03 ENCOUNTER — Encounter: Payer: Self-pay | Admitting: Internal Medicine

## 2019-07-03 ENCOUNTER — Telehealth: Payer: Self-pay | Admitting: Internal Medicine

## 2019-07-03 NOTE — Progress Notes (Signed)
[facility]:   1100am Telephone call from granddaughter Faiga Stones [phone]. Introduced her to the palliative care program and the services we can offer. She is in agreement and states that the best time for her and her mom would be when she is eligible to be on the phone with [place]. She would like for [place] to come out and see her grandmother and introduce her mother and grandmother to the program.  Scheduled to meet at home on Monday 07/08/19 at 12Noon. Fleet Contras will update her mother of the services and our visit.   I updated Lauren with [facility] of appointment time.   1145am Telephone call from Tula Nakayama pt's daughter. She stated that her daughter Fleet Contras has called to talk with me and that they want to cancel the appointment. She states her mother the pt is not in agreement. Tried to explain how we can help and that MD is recommending the services. Threasa Beards stated that if they need [place] in the future then she will call us back.  Appointment has been cancelled and I will reach out and let Lauren know as well at MD office.   Norm Parcel RN [phone]

## 2019-07-03 NOTE — Telephone Encounter (Signed)
TC to [facility] with [facility].  She states that they were going to admit pt on Monday, 07/08/19 at noon.  However, pt's daughter, Threasa Beards, just called and cancelled the admission.  Pt's daughter stated her mother doesn't want these services.  Cherie w/ [facility] states she tried to explain benefits of services to daughter, but daughter still declined services and informed her they would call back if her mother needed them at a later date. SChaplin, RN,BSN

## 2019-07-03 NOTE — Telephone Encounter (Signed)
Call placed to Cherie. States they made 2 more attempts to reach patient's daughter but were unsuccessful. Call placed to patient's daughter, Naiya Corral. Notified her that [facility] has been trying to reach her to enroll patient in Palliative Care services ([facility]). Relayed [facility]rie's number [phone]). States she will have her daughter call Cherie either today or tomorrow. Requested she call us back so we know they have connected with [facility]. Kinnie Feil, BSN, RN-BC

## 2019-07-03 NOTE — Telephone Encounter (Signed)
Amedeo Plenty from [facility] requesting a call back @[phone].

## 2019-07-05 NOTE — Telephone Encounter (Signed)
For now you can close out the referral. They are not yet ready to pursue palliative care services.

## 2019-07-06 ENCOUNTER — Other Ambulatory Visit: Payer: Self-pay | Admitting: Internal Medicine

## 2019-07-06 DIAGNOSIS — E611 Iron deficiency: Secondary | ICD-10-CM

## 2019-07-08 NOTE — Addendum Note (Signed)
Addended by: Neomia Dear on: 07/08/2019 11:44 AM   Modules accepted: Orders

## 2019-07-09 NOTE — Telephone Encounter (Signed)
Plls sch May appt with PCP

## 2019-07-18 ENCOUNTER — Other Ambulatory Visit: Payer: Self-pay | Admitting: Gastroenterology

## 2019-07-18 ENCOUNTER — Ambulatory Visit (INDEPENDENT_AMBULATORY_CARE_PROVIDER_SITE_OTHER): Payer: Medicare Other

## 2019-07-18 DIAGNOSIS — Z1159 Encounter for screening for other viral diseases: Secondary | ICD-10-CM

## 2019-07-19 LAB — SARS CORONAVIRUS 2 (TAT 6-24 HRS): SARS Coronavirus 2: NEGATIVE

## 2019-07-22 ENCOUNTER — Other Ambulatory Visit: Payer: Self-pay

## 2019-07-22 ENCOUNTER — Encounter: Payer: Self-pay | Admitting: Gastroenterology

## 2019-07-22 ENCOUNTER — Ambulatory Visit (AMBULATORY_SURGERY_CENTER): Payer: Medicare Other | Admitting: Gastroenterology

## 2019-07-22 VITALS — BP 126/76 | HR 91 | Temp 97.5°F | Resp 17 | Ht 59.25 in | Wt 85.0 lb

## 2019-07-22 DIAGNOSIS — K219 Gastro-esophageal reflux disease without esophagitis: Secondary | ICD-10-CM

## 2019-07-22 DIAGNOSIS — K449 Diaphragmatic hernia without obstruction or gangrene: Secondary | ICD-10-CM | POA: Diagnosis not present

## 2019-07-22 DIAGNOSIS — Z01818 Encounter for other preprocedural examination: Secondary | ICD-10-CM

## 2019-07-22 DIAGNOSIS — K2270 Barrett's esophagus without dysplasia: Secondary | ICD-10-CM | POA: Diagnosis not present

## 2019-07-22 DIAGNOSIS — D509 Iron deficiency anemia, unspecified: Secondary | ICD-10-CM | POA: Diagnosis not present

## 2019-07-22 DIAGNOSIS — K228 Other specified diseases of esophagus: Secondary | ICD-10-CM | POA: Diagnosis not present

## 2019-07-22 DIAGNOSIS — D508 Other iron deficiency anemias: Secondary | ICD-10-CM

## 2019-07-22 MED ORDER — SODIUM CHLORIDE 0.9 % IV SOLN: 500.0000 mL | Freq: Once | INTRAVENOUS | Status: DC

## 2019-07-22 MED ORDER — OMEPRAZOLE 40 MG PO CPDR: DELAYED_RELEASE_CAPSULE | ORAL | 11 refills | Status: AC

## 2019-07-22 NOTE — Progress Notes (Signed)
CW- vitals JB- Temp 

## 2019-07-22 NOTE — Patient Instructions (Signed)
Resume previous diet Continue present medications Await pathology results Start omeprazole 40mg  once daily 30 min before breakfast  YOU HAD AN ENDOSCOPIC PROCEDURE TODAY AT THE [facility]:   Refer to the procedure report that was given to you for any specific questions about what was found during the examination.  If the procedure report does not answer your questions, please call your gastroenterologist to clarify.  If you requested that your care partner not be given the details of your procedure findings, then the procedure report has been included in a sealed envelope for you to review at your convenience later.  YOU SHOULD EXPECT: Some feelings of bloating in the abdomen. Passage of more gas than usual.  Walking can help get rid of the air that was put into your GI tract during the procedure and reduce the bloating. If you had a lower endoscopy (such as a colonoscopy or flexible sigmoidoscopy) you may notice spotting of blood in your stool or on the toilet paper. If you underwent a bowel prep for your procedure, you may not have a normal bowel movement for a few days.  Please Note:  You might notice some irritation and congestion in your nose or some drainage.  This is from the oxygen used during your procedure.  There is no need for concern and it should clear up in a day or so.  SYMPTOMS TO REPORT IMMEDIATELY:    Following upper endoscopy (EGD)  Vomiting of blood or coffee ground material  New chest pain or pain under the shoulder blades  Painful or persistently difficult swallowing  New shortness of breath  Fever of 100F or higher  Black, tarry-looking stools  For urgent or emergent issues, a gastroenterologist can be reached at any hour by calling [phone]. Do not use MyChart messaging for urgent concerns.    DIET:  We do recommend a small meal at first, but then you may proceed to your regular diet.  Drink plenty of fluids but you should avoid  alcoholic beverages for 24 hours.  ACTIVITY:  You should plan to take it easy for the rest of today and you should NOT DRIVE or use heavy machinery until tomorrow (because of the sedation medicines used during the test).    FOLLOW UP: Our staff will call the number listed on your records 48-72 hours following your procedure to check on you and address any questions or concerns that you may have regarding the information given to you following your procedure. If we do not reach you, we will leave a message.  We will attempt to reach you two times.  During this call, we will ask if you have developed any symptoms of COVID 19. If you develop any symptoms (ie: fever, flu-like symptoms, shortness of breath, cough etc.) before then, please call [phone].  If you test positive for Covid 19 in the 2 weeks post procedure, please call and report this information to [phone].    If any biopsies were taken you will be contacted by phone or by letter within the next 1-3 weeks.  Please call us at [phone] if you have not heard about the biopsies in 3 weeks.    SIGNATURES/CONFIDENTIALITY: You and/or your care partner have signed paperwork which will be entered into your electronic medical record.  These signatures attest to the fact that that the information above on your After Visit Summary has been reviewed and is understood.  Full responsibility of the confidentiality of  this discharge information lies with you and/or your care-partner.

## 2019-07-22 NOTE — Progress Notes (Signed)
PT taken to PACU. Monitors in place. VSS. Report given to RN. 

## 2019-07-22 NOTE — Op Note (Signed)
[facility] Patient Name: Allison Rojas Procedure Date: 07/22/2019 9:50 AM MRN: [id number] Endoscopist: Rachael Fee , MD Age: 84 Referring MD:  Date of Birth: 1935/06/14 Gender: Female Account #: [id number] Procedure:                Upper GI endoscopy Indications:              Iron deficiency anemia, epigastric discomforts;                            Moderate gastritis (EGD , [name] 05/2010), biopsies                            showed no h. Pylori. Was taking ASA, NSAID.                            Previous ulcer noted in [place] per family.EGD                            05/2013 Dr. [name] pan-gastritis, [facility] without                            Cameron's erosions, tortuous esophagus. Medicines:                Monitored Anesthesia Care Procedure:                Pre-Anesthesia Assessment:                           - Prior to the procedure, a History and Physical                            was performed, and patient medications and                            allergies were reviewed. The patient's tolerance of                            previous anesthesia was also reviewed. The risks                            and benefits of the procedure and the sedation                            options and risks were discussed with the patient.                            All questions were answered, and informed consent                            was obtained. Prior Anticoagulants: The patient has                            taken no previous anticoagulant or antiplatelet  agents. ASA Grade Assessment: III - A patient with                            severe systemic disease. After reviewing the risks                            and benefits, the patient was deemed in                            satisfactory condition to undergo the procedure.                           After obtaining informed consent, the endoscope was                            passed under direct  vision. Throughout the                            procedure, the patient's blood pressure, pulse, and                            oxygen saturations were monitored continuously. The                            Endoscope was introduced through the mouth, and                            advanced to the second part of duodenum. The upper                            GI endoscopy was accomplished without difficulty.                            The patient tolerated the procedure well. Scope In: Scope Out: Findings:                 A medium-sized hiatal hernia was present with mild                            associated gastric edema, inflammation within the                            hernia segment.                           Slightly irregular Z line, biopsied to check for                            neoplasm.                           Mild (LA Grade A) reflux related esophagitis.                           The exam was otherwise without abnormality. Complications:  No immediate complications. Estimated blood loss:                            None. Estimated Blood Loss:     Estimated blood loss: none. Impression:               - A medium-sized hiatal hernia was present with                            mild associated gastric edema, inflammation within                            the hernia segment.                           - Slightly irregular Z line, biopsied to check for                            neoplasm.                           - Mild (LA Grade A) reflux related esophagitis.                           - The examination was otherwise normal. Recommendation:           - Patient has a contact number available for                            emergencies. The signs and symptoms of potential                            delayed complications were discussed with the                            patient. Return to normal activities tomorrow.                            Written discharge instructions  were provided to the                            patient.                           - Resume previous diet.                           - Continue present medications. New prescription                            called in today: omeprazole 40mg  pills one pill                            shortly before breakfast meal daily (disp 30 with  11 refills)                           - Await pathology results. Rachael Fee, MD 07/22/2019 10:15:10 AM This report has been signed electronically.

## 2019-07-22 NOTE — Progress Notes (Signed)
Pt's states no medical or surgical changes since previsit or office visit. 

## 2019-07-23 ENCOUNTER — Telehealth: Payer: Self-pay | Admitting: Internal Medicine

## 2019-07-23 NOTE — Telephone Encounter (Signed)
Granddaughter Fleet Contras called. Patient with temp 100. Had EGD yesterday (reviewed). Given tylenol. No other complaints. I told her it was unlikely related to the procedure and to see PCP if the problem persists or worsens. She also had numerous questions about the patient's poor eating habits, the EGD findings, the initiated therapy, etc. Beyond some basic information, I recommended that they reach out to Dr. [name] (or his nurse) during regular hours.

## 2019-07-24 ENCOUNTER — Telehealth: Payer: Self-pay

## 2019-07-24 ENCOUNTER — Telehealth: Payer: Self-pay | Admitting: *Deleted

## 2019-07-24 NOTE — Telephone Encounter (Signed)
Left message on 2nd follow up call. 

## 2019-07-24 NOTE — Telephone Encounter (Signed)
Attempted f/u phone call. No answer. No option to leave message.  

## 2019-07-25 ENCOUNTER — Encounter: Payer: Self-pay | Admitting: Gastroenterology

## 2019-07-25 ENCOUNTER — Telehealth: Payer: Self-pay | Admitting: Gastroenterology

## 2019-07-25 DIAGNOSIS — R634 Abnormal weight loss: Secondary | ICD-10-CM

## 2019-07-25 DIAGNOSIS — R109 Unspecified abdominal pain: Secondary | ICD-10-CM

## 2019-07-25 NOTE — Telephone Encounter (Signed)
The pt granddaughter called to say that the pt has had pain and fever since procedure on Monday.  The temp was 100.4 F on Tuesday all day.  She states she believes the temp has returned to normal as of this morning.  The pain is above the belly button and caused her to not want to eat.  She says she is unsure if the pain is dull or sharp or constant or comes and goes.  She is not with her grandmother at this time.  The pt is taking omeprazole as prescribed.  Please advise.  Appt was made for 5/10 for follow up.

## 2019-07-25 NOTE — Telephone Encounter (Signed)
She needs ct scan abd, pelvis with IV and oral contrast for abdominal pain, weight loss.  Thanks

## 2019-07-25 NOTE — Telephone Encounter (Signed)
The patient granddaughter has been notified of this information and all questions answered. The pt has been advised of the information and verbalized understanding.

## 2019-07-25 NOTE — Telephone Encounter (Signed)
You are scheduled on 08/01/2019 at 8 am at [facility] radiology. You should arrive 15 minutes prior to your appointment time for registration.   Pt will need to pick up contrast at [facility] at least 2 days prior.  You will also need BUN and CREAT the same day your pick up your contrast.    Left message on machine to call back

## 2019-07-26 ENCOUNTER — Emergency Department (HOSPITAL_COMMUNITY): Payer: Medicare Other

## 2019-07-26 ENCOUNTER — Inpatient Hospital Stay (HOSPITAL_COMMUNITY)
Admission: EM | Admit: 2019-07-26 | Discharge: 2019-08-17 | DRG: 344 | Disposition: E | Payer: Medicare Other | Attending: Student in an Organized Health Care Education/Training Program | Admitting: Student in an Organized Health Care Education/Training Program

## 2019-07-26 ENCOUNTER — Encounter (HOSPITAL_COMMUNITY): Payer: Self-pay

## 2019-07-26 ENCOUNTER — Other Ambulatory Visit: Payer: Self-pay

## 2019-07-26 ENCOUNTER — Ambulatory Visit (HOSPITAL_COMMUNITY)
Admission: EM | Admit: 2019-07-26 | Discharge: 2019-07-26 | Disposition: A | Discharge status: Home / Self Care | Payer: Medicare Other | Source: Home / Self Care

## 2019-07-26 ENCOUNTER — Telehealth: Payer: Self-pay | Admitting: Gastroenterology

## 2019-07-26 DIAGNOSIS — J9601 Acute respiratory failure with hypoxia: Secondary | ICD-10-CM

## 2019-07-26 DIAGNOSIS — K648 Other hemorrhoids: Secondary | ICD-10-CM | POA: Diagnosis not present

## 2019-07-26 DIAGNOSIS — Z79899 Other long term (current) drug therapy: Secondary | ICD-10-CM | POA: Diagnosis not present

## 2019-07-26 DIAGNOSIS — K6289 Other specified diseases of anus and rectum: Secondary | ICD-10-CM | POA: Diagnosis not present

## 2019-07-26 DIAGNOSIS — Z515 Encounter for palliative care: Secondary | ICD-10-CM | POA: Diagnosis present

## 2019-07-26 DIAGNOSIS — G4700 Insomnia, unspecified: Secondary | ICD-10-CM | POA: Diagnosis present

## 2019-07-26 DIAGNOSIS — Z9889 Other specified postprocedural states: Secondary | ICD-10-CM

## 2019-07-26 DIAGNOSIS — S32010S Wedge compression fracture of first lumbar vertebra, sequela: Secondary | ICD-10-CM

## 2019-07-26 DIAGNOSIS — R0902 Hypoxemia: Secondary | ICD-10-CM

## 2019-07-26 DIAGNOSIS — R1013 Epigastric pain: Secondary | ICD-10-CM | POA: Diagnosis not present

## 2019-07-26 DIAGNOSIS — J9811 Atelectasis: Secondary | ICD-10-CM | POA: Diagnosis present

## 2019-07-26 DIAGNOSIS — N183 Chronic kidney disease, stage 3 unspecified: Secondary | ICD-10-CM | POA: Diagnosis present

## 2019-07-26 DIAGNOSIS — Z4659 Encounter for fitting and adjustment of other gastrointestinal appliance and device: Secondary | ICD-10-CM

## 2019-07-26 DIAGNOSIS — K644 Residual hemorrhoidal skin tags: Secondary | ICD-10-CM | POA: Diagnosis not present

## 2019-07-26 DIAGNOSIS — K449 Diaphragmatic hernia without obstruction or gangrene: Secondary | ICD-10-CM | POA: Diagnosis present

## 2019-07-26 DIAGNOSIS — K56609 Unspecified intestinal obstruction, unspecified as to partial versus complete obstruction: Secondary | ICD-10-CM | POA: Diagnosis not present

## 2019-07-26 DIAGNOSIS — K2270 Barrett's esophagus without dysplasia: Secondary | ICD-10-CM | POA: Diagnosis present

## 2019-07-26 DIAGNOSIS — R195 Other fecal abnormalities: Secondary | ICD-10-CM | POA: Diagnosis not present

## 2019-07-26 DIAGNOSIS — Z8711 Personal history of peptic ulcer disease: Secondary | ICD-10-CM

## 2019-07-26 DIAGNOSIS — M810 Age-related osteoporosis without current pathological fracture: Secondary | ICD-10-CM | POA: Diagnosis present

## 2019-07-26 DIAGNOSIS — M4850XA Collapsed vertebra, not elsewhere classified, site unspecified, initial encounter for fracture: Secondary | ICD-10-CM | POA: Diagnosis present

## 2019-07-26 DIAGNOSIS — R918 Other nonspecific abnormal finding of lung field: Secondary | ICD-10-CM

## 2019-07-26 DIAGNOSIS — J90 Pleural effusion, not elsewhere classified: Secondary | ICD-10-CM | POA: Diagnosis not present

## 2019-07-26 DIAGNOSIS — R630 Anorexia: Secondary | ICD-10-CM | POA: Diagnosis present

## 2019-07-26 DIAGNOSIS — Q438 Other specified congenital malformations of intestine: Secondary | ICD-10-CM

## 2019-07-26 DIAGNOSIS — K2970 Gastritis, unspecified, without bleeding: Secondary | ICD-10-CM | POA: Diagnosis present

## 2019-07-26 DIAGNOSIS — T17908A Unspecified foreign body in respiratory tract, part unspecified causing other injury, initial encounter: Secondary | ICD-10-CM | POA: Diagnosis not present

## 2019-07-26 DIAGNOSIS — R933 Abnormal findings on diagnostic imaging of other parts of digestive tract: Secondary | ICD-10-CM | POA: Diagnosis not present

## 2019-07-26 DIAGNOSIS — Z20822 Contact with and (suspected) exposure to covid-19: Secondary | ICD-10-CM | POA: Diagnosis not present

## 2019-07-26 DIAGNOSIS — R748 Abnormal levels of other serum enzymes: Secondary | ICD-10-CM | POA: Diagnosis present

## 2019-07-26 DIAGNOSIS — K6389 Other specified diseases of intestine: Secondary | ICD-10-CM | POA: Diagnosis not present

## 2019-07-26 DIAGNOSIS — K649 Unspecified hemorrhoids: Secondary | ICD-10-CM | POA: Diagnosis not present

## 2019-07-26 DIAGNOSIS — K219 Gastro-esophageal reflux disease without esophagitis: Secondary | ICD-10-CM | POA: Diagnosis not present

## 2019-07-26 DIAGNOSIS — Z66 Do not resuscitate: Secondary | ICD-10-CM | POA: Diagnosis not present

## 2019-07-26 DIAGNOSIS — K5939 Other megacolon: Secondary | ICD-10-CM | POA: Diagnosis present

## 2019-07-26 DIAGNOSIS — M545 Low back pain: Secondary | ICD-10-CM | POA: Diagnosis present

## 2019-07-26 DIAGNOSIS — J690 Pneumonitis due to inhalation of food and vomit: Secondary | ICD-10-CM | POA: Diagnosis not present

## 2019-07-26 DIAGNOSIS — K56600 Partial intestinal obstruction, unspecified as to cause: Principal | ICD-10-CM

## 2019-07-26 DIAGNOSIS — R109 Unspecified abdominal pain: Secondary | ICD-10-CM | POA: Diagnosis not present

## 2019-07-26 DIAGNOSIS — R140 Abdominal distension (gaseous): Secondary | ICD-10-CM

## 2019-07-26 DIAGNOSIS — K5989 Other specified functional intestinal disorders: Secondary | ICD-10-CM | POA: Diagnosis not present

## 2019-07-26 DIAGNOSIS — Z681 Body mass index (BMI) 19 or less, adult: Secondary | ICD-10-CM | POA: Diagnosis not present

## 2019-07-26 DIAGNOSIS — E210 Primary hyperparathyroidism: Secondary | ICD-10-CM | POA: Diagnosis not present

## 2019-07-26 DIAGNOSIS — K642 Third degree hemorrhoids: Secondary | ICD-10-CM | POA: Diagnosis present

## 2019-07-26 DIAGNOSIS — N261 Atrophy of kidney (terminal): Secondary | ICD-10-CM | POA: Diagnosis not present

## 2019-07-26 DIAGNOSIS — D509 Iron deficiency anemia, unspecified: Secondary | ICD-10-CM | POA: Diagnosis present

## 2019-07-26 DIAGNOSIS — G8929 Other chronic pain: Secondary | ICD-10-CM | POA: Diagnosis present

## 2019-07-26 DIAGNOSIS — J929 Pleural plaque without asbestos: Secondary | ICD-10-CM | POA: Diagnosis not present

## 2019-07-26 DIAGNOSIS — K5900 Constipation, unspecified: Secondary | ICD-10-CM

## 2019-07-26 DIAGNOSIS — K5909 Other constipation: Secondary | ICD-10-CM | POA: Diagnosis not present

## 2019-07-26 DIAGNOSIS — R600 Localized edema: Secondary | ICD-10-CM | POA: Diagnosis not present

## 2019-07-26 DIAGNOSIS — K567 Ileus, unspecified: Secondary | ICD-10-CM | POA: Diagnosis not present

## 2019-07-26 DIAGNOSIS — M48061 Spinal stenosis, lumbar region without neurogenic claudication: Secondary | ICD-10-CM | POA: Diagnosis present

## 2019-07-26 DIAGNOSIS — E559 Vitamin D deficiency, unspecified: Secondary | ICD-10-CM | POA: Diagnosis not present

## 2019-07-26 DIAGNOSIS — Z539 Procedure and treatment not carried out, unspecified reason: Secondary | ICD-10-CM | POA: Diagnosis present

## 2019-07-26 DIAGNOSIS — R112 Nausea with vomiting, unspecified: Secondary | ICD-10-CM | POA: Diagnosis not present

## 2019-07-26 DIAGNOSIS — K56699 Other intestinal obstruction unspecified as to partial versus complete obstruction: Secondary | ICD-10-CM | POA: Diagnosis not present

## 2019-07-26 DIAGNOSIS — M549 Dorsalgia, unspecified: Secondary | ICD-10-CM | POA: Diagnosis not present

## 2019-07-26 DIAGNOSIS — R935 Abnormal findings on diagnostic imaging of other abdominal regions, including retroperitoneum: Secondary | ICD-10-CM | POA: Diagnosis not present

## 2019-07-26 DIAGNOSIS — R194 Change in bowel habit: Secondary | ICD-10-CM | POA: Diagnosis not present

## 2019-07-26 DIAGNOSIS — M40204 Unspecified kyphosis, thoracic region: Secondary | ICD-10-CM | POA: Diagnosis not present

## 2019-07-26 DIAGNOSIS — K2100 Gastro-esophageal reflux disease with esophagitis, without bleeding: Secondary | ICD-10-CM | POA: Diagnosis present

## 2019-07-26 DIAGNOSIS — R64 Cachexia: Secondary | ICD-10-CM | POA: Diagnosis present

## 2019-07-26 DIAGNOSIS — E43 Unspecified severe protein-calorie malnutrition: Secondary | ICD-10-CM | POA: Diagnosis not present

## 2019-07-26 DIAGNOSIS — R5383 Other fatigue: Secondary | ICD-10-CM | POA: Diagnosis not present

## 2019-07-26 DIAGNOSIS — Z4682 Encounter for fitting and adjustment of non-vascular catheter: Secondary | ICD-10-CM | POA: Diagnosis not present

## 2019-07-26 DIAGNOSIS — Z79891 Long term (current) use of opiate analgesic: Secondary | ICD-10-CM | POA: Diagnosis not present

## 2019-07-26 DIAGNOSIS — Z888 Allergy status to other drugs, medicaments and biological substances status: Secondary | ICD-10-CM

## 2019-07-26 LAB — URINALYSIS, ROUTINE W REFLEX MICROSCOPIC
Bacteria, UA: NONE SEEN
Bilirubin Urine: NEGATIVE
Glucose, UA: NEGATIVE mg/dL
Ketones, ur: 20 mg/dL — AB
Leukocytes,Ua: NEGATIVE
Nitrite: NEGATIVE
Protein, ur: NEGATIVE mg/dL
Specific Gravity, Urine: >1.046 — ABNORMAL HIGH (ref 1.005–1.030)
pH: 5 (ref 5.0–8.0)

## 2019-07-26 LAB — COMPREHENSIVE METABOLIC PANEL
ALT: 16 U/L (ref 0–44)
AST: 23 U/L (ref 15–41)
Albumin: 3.1 g/dL — ABNORMAL LOW (ref 3.5–5.0)
Alkaline Phosphatase: 50 U/L (ref 38–126)
Anion gap: 9 (ref 5–15)
BUN: 18 mg/dL (ref 8–23)
CO2: 20 mmol/L — ABNORMAL LOW (ref 22–32)
Calcium: 6.9 mg/dL — ABNORMAL LOW (ref 8.9–10.3)
Chloride: 107 mmol/L (ref 98–111)
Creatinine, Ser: 0.92 mg/dL (ref 0.44–1.00)
GFR calc Af Amer: >60 mL/min (ref >60)
GFR calc non Af Amer: 58 mL/min — ABNORMAL LOW (ref >60)
Glucose, Bld: 114 mg/dL — ABNORMAL HIGH (ref 70–99)
Potassium: 4.3 mmol/L (ref 3.5–5.1)
Sodium: 136 mmol/L (ref 135–145)
Total Bilirubin: 0.3 mg/dL (ref 0.3–1.2)
Total Protein: 6.1 g/dL — ABNORMAL LOW (ref 6.5–8.1)

## 2019-07-26 LAB — CBC
HCT: 32.6 % — ABNORMAL LOW (ref 36.0–46.0)
Hemoglobin: 10.4 g/dL — ABNORMAL LOW (ref 12.0–15.0)
MCH: 30.7 pg (ref 26.0–34.0)
MCHC: 31.9 g/dL (ref 30.0–36.0)
MCV: 96.2 fL (ref 80.0–100.0)
Platelets: 330 10*3/uL (ref 150–400)
RBC: 3.39 MIL/uL — ABNORMAL LOW (ref 3.87–5.11)
RDW: 13.9 % (ref 11.5–15.5)
WBC: 5 10*3/uL (ref 4.0–10.5)
nRBC: 0 % (ref 0.0–0.2)

## 2019-07-26 LAB — LACTIC ACID, PLASMA: Lactic Acid, Venous: 1.1 mmol/L (ref 0.5–1.9)

## 2019-07-26 LAB — LIPASE, BLOOD: Lipase: 155 U/L — ABNORMAL HIGH (ref 11–51)

## 2019-07-26 MED ORDER — RAMELTEON 8 MG PO TABS
8.0000 mg | ORAL_TABLET | Freq: Every day | ORAL | Status: DC
  Filled 2019-07-26 (×2): qty 1

## 2019-07-26 MED ORDER — LACTATED RINGERS IV SOLN: INTRAVENOUS | Status: DC

## 2019-07-26 MED ORDER — HEPARIN SODIUM (PORCINE) 5000 UNIT/ML IJ SOLN: 5000.0000 [IU] | Freq: Three times a day (TID) | INTRAMUSCULAR | Status: DC

## 2019-07-26 MED ORDER — KETOROLAC TROMETHAMINE 15 MG/ML IJ SOLN: 15.0000 mg | Freq: Three times a day (TID) | INTRAMUSCULAR | Status: DC | PRN

## 2019-07-26 MED ORDER — LACTATED RINGERS IV BOLUS
1000.0000 mL | Freq: Once | INTRAVENOUS | Status: AC
  Administered 2019-07-27: 1000 mL via INTRAVENOUS

## 2019-07-26 MED ORDER — SODIUM CHLORIDE 0.9 % IV BOLUS
500.0000 mL | Freq: Once | INTRAVENOUS | Status: AC
  Administered 2019-07-26: 500 mL via INTRAVENOUS

## 2019-07-26 MED ORDER — PANTOPRAZOLE SODIUM 40 MG IV SOLR
40.0000 mg | INTRAVENOUS | Status: DC
  Administered 2019-07-27 – 2019-08-01 (×7): 40 mg via INTRAVENOUS
  Filled 2019-07-26 (×8): qty 40

## 2019-07-26 MED ORDER — IOHEXOL 300 MG/ML  SOLN
100.0000 mL | Freq: Once | INTRAMUSCULAR | Status: AC | PRN
  Administered 2019-07-26: 20:00:00 100 mL via INTRAVENOUS

## 2019-07-26 MED ORDER — ENOXAPARIN SODIUM 30 MG/0.3ML ~~LOC~~ SOLN: 30.0000 mg | Freq: Every day | SUBCUTANEOUS | Status: DC

## 2019-07-26 NOTE — ED Notes (Signed)
Called CT, pt is next

## 2019-07-26 NOTE — ED Triage Notes (Signed)
Pt accompanied by daughter who reports pt had endoscopy on Monday and has had abd pain and not been eating since then.

## 2019-07-26 NOTE — H&P (Signed)
Date: 08/07/19               Patient Name:  Allison Rojas MRN: [id number]  DOB: 06-05-1935 Age / Sex: 84 y.o., female   PCP: Theotis Barrio, MD         Medical Service: Internal Medicine Teaching Service         Attending Physician: Dr. [name], MD    First Contact: Dr. Lyn Hollingshead Pager: [phone]  Second Contact: Dr. Maryla Morrow Pager: [phone]       After Hours (After 5p/  First Contact Pager: [phone]  weekends / holidays): Second Contact Pager: [phone]   Chief Complaint: abdominal pain and anorexia   History of Present Illness: Ms. Lamboy is a pleasant 84 y/o [place] woman with PMHx significant for chronic back pain on Tramadol, iron deficiency anemia, vitamin D deficiency, Osteoporosis, Gastritis and Barrett's esophagus who presented to the ED with one week of abdominal pain.  Daughter reports that her mom has had "severe acid" since December for which she recently underwent an upper endoscopy a week ago for further evaluation. Since her endoscopy, she has been experiencing severe abdominal pain. The pain is located above her navel to the epigastric area. She reports that the pain resembles the pain during childbirth and comes in waves like a contraction, twisting in nature. She states that her abdominal pain is relieved with omeprazole and tramadol which she takes for chronic lower back pain and leg cramps. She reports of decreased oral intake since Monday but the daughter denies nausea or vomiting. She was able to tolerate a few sips of rice broth. She currently denies fever but she reports of fevers immediately after her endoscopic procedure. She hasn't had a BM or passed flatus in a week. She denies dysuria although it has been harder for her to urinate. Other pertinent negatives include falls, skin rashes and LOC.   Per chart review, the biopsy from the upper endoscopy revealed Barrett's esophagus and she was advised to continue PPI.    Meds:  Current Meds  Medication Sig  .  acetaminophen (TYLENOL) 500 MG tablet Take 1 tablet (500 mg total) by mouth 3 (three) times daily. (Patient taking differently: Take 1,000 mg by mouth 3 (three) times daily as needed (for dental pain). )  . amitriptyline (ELAVIL) 50 MG tablet Take 1 tablet (50 mg total) by mouth at bedtime.  . famotidine (PEPCID) 20 MG tablet TAKE ONE TABLET BY MOUTH TWO TIMES A DAY AS NEEDED FOR HEARTBURN OR INDIGESTION (Patient taking differently: Take 20 mg by mouth at bedtime. )  . fluticasone (FLONASE) 50 MCG/ACT nasal spray Place 2 sprays into both nostrils daily as needed for rhinitis. For allergies (Patient taking differently: Place 2 sprays into both nostrils daily as needed for allergies or rhinitis. )  . levocetirizine (XYZAL) 5 MG tablet Take 5 mg by mouth daily as needed for allergies.  Marland Kitchen omeprazole (PRILOSEC) 40 MG capsule Take medicine 30 minutes before breakfast (Patient taking differently: Take 40 mg by mouth daily before breakfast. Take medicine 30 minutes before breakfast)  . PROLIA 60 MG/ML SOSY injection Inject 60 mg into the skin every 6 (six) months.  . traMADol (ULTRAM) 50 MG tablet Take 1 tablet (50 mg total) by mouth 2 (two) times daily as needed for moderate pain or severe pain. (Patient taking differently: Take 50 mg by mouth in the morning and at bedtime. )   Allergies: Allergies as of 2019/08/07 - Review Complete 2019/08/07  Allergen Reaction Noted  . Amitriptyline Other (See Comments) 08/10/2012   Past Medical History:  Diagnosis Date  . Allergy   . Chronic low back pain   . Compression fracture    T9 and T12 noted 05/2012 imaging. Followed by pain clinic   . Fall    Jan or Feb 2014   . Gastritis   . GERD (gastroesophageal reflux disease)   . Insomnia   . Irregular heart rate   . Osteoporosis   . Peptic ulcer disease   . Rib pain   . Right wrist fracture    3 - 4 monthes ago   Family History:  Family History  Problem Relation Age of Onset  . Colon cancer Neg Hx   .  Esophageal cancer Neg Hx   . Rectal cancer Neg Hx   . Stomach cancer Neg Hx    Social History:  Social History   Tobacco Use  . Smoking status: Never Smoker  . Smokeless tobacco: Never Used  Substance Use Topics  . Alcohol use: No  . Drug use: No   -Lives with daughter -does not smoke, drink alcohol or use drugs  Review of Systems: A complete ROS was negative except as per HPI.   Physical Exam: Blood pressure 108/68, pulse 93, temperature 98.5 F (36.9 C), temperature source Oral, resp. rate 16, SpO2 98 %.  Physical Exam  Constitutional: No distress.  Thin elderly woman  HENT:  Head: Normocephalic and atraumatic.  Cardiovascular: Normal rate and regular rhythm.  No murmur heard. Pulmonary/Chest: Effort normal and breath sounds normal. No respiratory distress.  Abdominal: She exhibits no distension and no mass. There is abdominal tenderness (over the epigastrium). There is guarding (voluntary). There is no rebound.  Decreased bowel sounds  Skin: She is not diaphoretic.  Nursing note and vitals reviewed.  Labs: Results for orders placed or performed during the hospital encounter of 08/15/2019 (from the past 24 hour(s))  Lipase, blood     Status: Abnormal   Collection Time: 08/15/2019 12:53 PM  Result Value Ref Range   Lipase 155 (H) 11 - 51 U/L  Comprehensive metabolic panel     Status: Abnormal   Collection Time: 08/11/2019 12:53 PM  Result Value Ref Range   Sodium 136 135 - 145 mmol/L   Potassium 4.3 3.5 - 5.1 mmol/L   Chloride 107 98 - 111 mmol/L   CO2 20 (L) 22 - 32 mmol/L   Glucose, Bld 114 (H) 70 - 99 mg/dL   BUN 18 8 - 23 mg/dL   Creatinine, Ser 1.61 0.44 - 1.00 mg/dL   Calcium 6.9 (L) 8.9 - 10.3 mg/dL   Total Protein 6.1 (L) 6.5 - 8.1 g/dL   Albumin 3.1 (L) 3.5 - 5.0 g/dL   AST 23 15 - 41 U/L   ALT 16 0 - 44 U/L   Alkaline Phosphatase 50 38 - 126 U/L   Total Bilirubin 0.3 0.3 - 1.2 mg/dL   GFR calc non Af Amer 58 (L) >60 mL/min   GFR calc Af Amer >60 >60  mL/min   Anion gap 9 5 - 15  CBC     Status: Abnormal   Collection Time: 08/05/2019 12:53 PM  Result Value Ref Range   WBC 5.0 4.0 - 10.5 K/uL   RBC 3.39 (L) 3.87 - 5.11 MIL/uL   Hemoglobin 10.4 (L) 12.0 - 15.0 g/dL   HCT 09.6 (L) 04.5 - 40.9 %   MCV 96.2 80.0 - 100.0 fL  MCH 30.7 26.0 - 34.0 pg   MCHC 31.9 30.0 - 36.0 g/dL   RDW 33.5 45.6 - 25.6 %   Platelets 330 150 - 400 K/uL   nRBC 0.0 0.0 - 0.2 %  Lactic acid, plasma     Status: None   Collection Time: 08/03/2019  6:04 PM  Result Value Ref Range   Lactic Acid, Venous 1.1 0.5 - 1.9 mmol/L  Urinalysis, Routine w reflex microscopic     Status: Abnormal   Collection Time: 07/28/2019 10:34 PM  Result Value Ref Range   Color, Urine YELLOW YELLOW   APPearance CLEAR CLEAR   Specific Gravity, Urine >1.046 (H) 1.005 - 1.030   pH 5.0 5.0 - 8.0   Glucose, UA NEGATIVE NEGATIVE mg/dL   Hgb urine dipstick SMALL (A) NEGATIVE   Bilirubin Urine NEGATIVE NEGATIVE   Ketones, ur 20 (A) NEGATIVE mg/dL   Protein, ur NEGATIVE NEGATIVE mg/dL   Nitrite NEGATIVE NEGATIVE   Leukocytes,Ua NEGATIVE NEGATIVE   RBC / HPF 0-5 0 - 5 RBC/hpf   WBC, UA 0-5 0 - 5 WBC/hpf   Bacteria, UA NONE SEEN NONE SEEN   Mucus PRESENT    EKG: personally reviewed my interpretation is NSR without acute ischemic changes.  CT Abdomen Pelvis W Contrast  Result Date: 08/07/2019 CLINICAL DATA:  Abdominal pain history of recent endoscopy EXAM: CT ABDOMEN AND PELVIS WITH CONTRAST TECHNIQUE: Multidetector CT imaging of the abdomen and pelvis was performed using the standard protocol following bolus administration of intravenous contrast. CONTRAST:  OMNIPAQUE IOHEXOL 300 MG/ML  SOLN COMPARISON:  CT 01/24/2018 FINDINGS: Lower chest: Lung bases demonstrate mild bronchiectasis within the right middle lobe and lingula. No acute consolidation or pleural effusion. Cardiac size within normal limits. Small moderate hiatal hernia. Hepatobiliary: No focal liver abnormality is seen. No  gallstones, gallbladder wall thickening, or biliary dilatation. Pancreas: Unremarkable. No pancreatic ductal dilatation or surrounding inflammatory changes. Spleen: Normal in size without focal abnormality. Adrenals/Urinary Tract: Adrenal glands are within normal limits. Cyst in the mid left kidney. Negative for hydronephrosis. The bladder is unremarkable. Stomach/Bowel: The stomach is nonenlarged. Dilated fluid-filled distal small bowel loops measuring up to 3.3 cm; small bowel is dilated to the terminal ileum. There is stool and fluid distension of the cecum and ascending colon with transition to more normal caliber stool filled transverse and left colon. No obvious mass lesion. Generous sized appendix measuring up to 9.5 mm without wall thickening or definitive inflammatory change. Vascular/Lymphatic: Moderate aortic atherosclerosis without aneurysm. No suspicious adenopathy. Reproductive: Uterus and adnexa are unremarkable. Other: Gas collection in the left upper quadrant, series 3, image number 24 appears intraluminal and likely splenic flexure. No definitive free air. No definitive free fluid. Hazy appearance of the mesentery. Musculoskeletal: Kyphosis of the spine with chronic compression fractures at T12 and L1. IMPRESSION: 1. Dilated fluid-filled distal small bowel and terminal ileum with stool and fluid distension of the cecum and ascending colon, potentially related to ileus or enteritis with partial obstruction also included in the differential but felt less likely; there is no obvious obstructing colon mass. 2. Generous appearing appendix in terms of size but no other features to suggest acute inflammatory process 3. Moderate hiatal hernia Electronically Signed   By: Jasmine Pang M.D.   On: 08/16/2019 20:49   Assessment:  Ms. Hodder is a pleasant 84 y/o [place] woman with PMHx significant for chronic back pain on Tramadol, Iron deficiency anemia, vitamin D deficiency, Osteoporosis, Gastritis and  Barrett's esophagus  who presented to the ED with one week of abdominal pain and anorexia after an upper endoscopy one week prior with imaging findings concerning for ileus here for medical management with NG decompression.  Plan by Problem:  Abdominal Pain/Ileus: -pt presenting with abdominal pain in setting of one week of constipation  -pt HDS without electrolyte abnormalities or signs of infection -imaging findings consistent with ileus vs enteritis -clinical picture favors ileus secondary to opioid induced constipation vs postop ileus (EGD) vs. Mixed -GI consulted and will see pt in am, recommends NG tube  Plan: -NG tube for decompression -NPO -LR bolus + maintenance -toradol for pain  -may consider methylnaltrexone for OIC -fu GI recs in AM  Chronic Pain: -will hold tramadol in setting of ileus -toradol for pain prn  GERD/Gastritis/Barret's Esophagus: -continue high dose PPI, omeprazole 40 mg   Insomnia: -will hold amitryptyline given anticholinergic properties -will give ramelteon  Active Problems:   Ileus ([place])  Dispo: Admit patient to Inpatient with expected length of stay greater than 2 midnights.  Signed: Al Decant, MD 07/29/2019, 11:51 PM  Pager: [phone]

## 2019-07-26 NOTE — Telephone Encounter (Signed)
Left message on machine to call back  

## 2019-07-26 NOTE — ED Notes (Signed)
Pt evaluated in waiting room. Pt doubled over in wheelchair. Pt's daughter reports that pt has not eaten in 6 days, had abdom pain. Notified Dr. Dimitri Ped of pt status/complaint. Dr. Chaney Malling advised pt to go to ER STAT for higher level eval/tx. Pt daughter notified of medical provider's instructions to go to ER STAT, verbalized understanding and agreement.

## 2019-07-26 NOTE — Telephone Encounter (Signed)
The pt's granddaughter called because the pt is not eating or drinking enough.  She inquired whether the pt can try Boost or Ensure.  I did let her know that she can try those and be sure to keep future appts.  She was also advised to call if she has further concerns or the pt is not drinking the boost or Ensure. The pt has been advised of the information and verbalized understanding.

## 2019-07-26 NOTE — ED Notes (Signed)
Pt transported to CT ?

## 2019-07-26 NOTE — ED Provider Notes (Signed)
[facility] EMERGENCY DEPARTMENT Provider Note   CSN: [id number] Arrival date & time: 08/10/2019  1223     History Chief Complaint  Patient presents with  . Abdominal Pain    Allison Rojas is a 84 y.o. female with PMHx GERD, chronic low back pain, CKD stage III, IDA who presents to the ED today with complaint of gradual onset, constant, sharp, epigastric abdominal pain s/p endoscopy done on 04/05. Per granddaughter pt has not had much of an appetite since her endoscopy. She has been trying to stay hydrated with water however everytime she eats or drinks anything she begins to feel nauseated. Daughter reports pt had a temp of 100.0 the day after the procedure that was managed with Tylenol; no fevers since. They were concerned about pt's appetite change; her gastroenterologist has scheduled an outpatient CT on 04/14 however family took her to urgent care earlier today who sent her to the ED for further evaluation. Pt has not had a bowel movement since her procedure as well however family attributes to her not eating very much. Pt denies chest pain, shortness of breath, vomiting, or any other associated symptoms.   Endoscopy Findings Impression:               - A medium-sized hiatal hernia was present with                            mild associated gastric edema, inflammation within                            the hernia segment.                           - Slightly irregular Z line, biopsied to check for                            neoplasm.                           - Mild (LA Grade A) reflux related esophagitis.                           - The examination was otherwise normal.  The history is provided by the patient and a relative. The history is limited by a language barrier. Language interpreter used: Family interpretting at bedside.       Past Medical History:  Diagnosis Date  . Allergy   . Chronic low back pain   . Compression fracture    T9 and T12 noted 05/2012  imaging. Followed by pain clinic   . Fall    Jan or Feb 2014   . Gastritis   . GERD (gastroesophageal reflux disease)   . Insomnia   . Irregular heart rate   . Osteoporosis   . Peptic ulcer disease   . Rib pain   . Right wrist fracture    3 - 4 monthes ago    Patient Active Problem List   Diagnosis Date Noted  . Goals of care, counseling/discussion 06/12/2019  . Diabetes mellitus screening 06/04/2018  . Hematuria 04/13/2018  . Vitamin D deficiency 06/05/2017  . Health care maintenance 06/28/2015  . CKD (chronic kidney disease) stage 3, GFR 30-59 ml/min 06/25/2015  .  Chronic pain 02/01/2015  . Compression fracture of T12 vertebra (HCC) 05/22/2012  . Iron deficiency anemia 05/25/2010  . Gastritis 04/09/2010  . Osteoporosis 02/23/2010  . Chronic, continuous use of opioids 11/16/2009    Past Surgical History:  Procedure Laterality Date  . NO PAST SURGERIES       OB History   No obstetric history on file.     Family History  Problem Relation Age of Onset  . Colon cancer Neg Hx   . Esophageal cancer Neg Hx   . Rectal cancer Neg Hx   . Stomach cancer Neg Hx     Social History   Tobacco Use  . Smoking status: Never Smoker  . Smokeless tobacco: Never Used  Substance Use Topics  . Alcohol use: No  . Drug use: No    Home Medications Prior to Admission medications   Medication Sig Start Date End Date Taking? Authorizing Provider  acetaminophen (TYLENOL) 500 MG tablet Take 1 tablet (500 mg total) by mouth 3 (three) times daily. 07/24/13   Bobbye Charleston, MD  amitriptyline (ELAVIL) 50 MG tablet Take 1 tablet (50 mg total) by mouth at bedtime. 02/20/19   [name], MD  diclofenac sodium (VOLTAREN) 1 % GEL Apply 4 g topically 4 (four) times daily. 11/12/15   Lora Paula, MD  famotidine (PEPCID) 20 MG tablet TAKE ONE TABLET BY MOUTH TWO TIMES A DAY AS NEEDED FOR HEARTBURN OR INDIGESTION 02/20/19   [name], MD  FEROSUL 325 (65 Fe) MG tablet  TAKE ONE TABLET BY MOUTH EVERY MORNING WITH BREAKFAST 07/09/19   Burns Spain, MD  fluticasone (FLONASE) 50 MCG/ACT nasal spray Place 2 sprays into both nostrils daily as needed for rhinitis. For allergies 02/20/19   [name], MD  omeprazole (PRILOSEC) 40 MG capsule Take medicine 30 minutes before breakfast 07/22/19   Rachael Fee, MD  PROLIA 60 MG/ML SOSY injection Inject 60 mg into the skin every 6 (six) months. 05/10/19   [name], MD  traMADol (ULTRAM) 50 MG tablet Take 1 tablet (50 mg total) by mouth 2 (two) times daily as needed for moderate pain or severe pain. 06/12/19   [name], MD    Allergies    Amitriptyline  Review of Systems   Review of Systems  Constitutional: Positive for appetite change, fatigue and fever (resolved). Negative for chills.  Respiratory: Negative for shortness of breath.   Cardiovascular: Negative for chest pain.  Gastrointestinal: Positive for abdominal pain, constipation and nausea. Negative for vomiting.  All other systems reviewed and are negative.   Physical Exam Updated Vital Signs BP 110/70 (BP Location: Right Arm)   Pulse (!) 120   Temp 98.5 F (36.9 C) (Oral)   Resp 16   SpO2 100%   Physical Exam Vitals and nursing note reviewed.  Constitutional:      Comments: Cachectic, frail appearing female  HENT:     Head: Normocephalic and atraumatic.  Eyes:     Conjunctiva/sclera: Conjunctivae normal.  Cardiovascular:     Rate and Rhythm: Regular rhythm. Tachycardia present.     Heart sounds: Normal heart sounds.  Pulmonary:     Effort: Pulmonary effort is normal.     Breath sounds: Normal breath sounds. No wheezing, rhonchi or rales.  Abdominal:     Palpations: Abdomen is soft.     Tenderness: There is abdominal tenderness in the epigastric area and periumbilical area. There is no right CVA tenderness, left CVA tenderness, guarding or  rebound.  Musculoskeletal:     Cervical back: Neck supple.  Skin:     General: Skin is warm and dry.  Neurological:     Mental Status: She is alert.     ED Results / Procedures / Treatments   Labs (all labs ordered are listed, but only abnormal results are displayed) Labs Reviewed  LIPASE, BLOOD - Abnormal; Notable for the following components:      Result Value   Lipase 155 (*)    All other components within normal limits  COMPREHENSIVE METABOLIC PANEL - Abnormal; Notable for the following components:   CO2 20 (*)    Glucose, Bld 114 (*)    Calcium 6.9 (*)    Total Protein 6.1 (*)    Albumin 3.1 (*)    GFR calc non Af Amer 58 (*)    All other components within normal limits  CBC - Abnormal; Notable for the following components:   RBC 3.39 (*)    Hemoglobin 10.4 (*)    HCT 32.6 (*)    All other components within normal limits  SARS CORONAVIRUS 2 (TAT 6-24 HRS)  LACTIC ACID, PLASMA  URINALYSIS, ROUTINE W REFLEX MICROSCOPIC  CALCIUM, IONIZED    EKG None  Radiology CT Abdomen Pelvis W Contrast  Result Date: 08/01/2019 CLINICAL DATA:  Abdominal pain history of recent endoscopy EXAM: CT ABDOMEN AND PELVIS WITH CONTRAST TECHNIQUE: Multidetector CT imaging of the abdomen and pelvis was performed using the standard protocol following bolus administration of intravenous contrast. CONTRAST:  OMNIPAQUE IOHEXOL 300 MG/ML  SOLN COMPARISON:  CT 01/24/2018 FINDINGS: Lower chest: Lung bases demonstrate mild bronchiectasis within the right middle lobe and lingula. No acute consolidation or pleural effusion. Cardiac size within normal limits. Small moderate hiatal hernia. Hepatobiliary: No focal liver abnormality is seen. No gallstones, gallbladder wall thickening, or biliary dilatation. Pancreas: Unremarkable. No pancreatic ductal dilatation or surrounding inflammatory changes. Spleen: Normal in size without focal abnormality. Adrenals/Urinary Tract: Adrenal glands are within normal limits. Cyst in the mid left kidney. Negative for hydronephrosis. The  bladder is unremarkable. Stomach/Bowel: The stomach is nonenlarged. Dilated fluid-filled distal small bowel loops measuring up to 3.3 cm; small bowel is dilated to the terminal ileum. There is stool and fluid distension of the cecum and ascending colon with transition to more normal caliber stool filled transverse and left colon. No obvious mass lesion. Generous sized appendix measuring up to 9.5 mm without wall thickening or definitive inflammatory change. Vascular/Lymphatic: Moderate aortic atherosclerosis without aneurysm. No suspicious adenopathy. Reproductive: Uterus and adnexa are unremarkable. Other: Gas collection in the left upper quadrant, series 3, image number 24 appears intraluminal and likely splenic flexure. No definitive free air. No definitive free fluid. Hazy appearance of the mesentery. Musculoskeletal: Kyphosis of the spine with chronic compression fractures at T12 and L1. IMPRESSION: 1. Dilated fluid-filled distal small bowel and terminal ileum with stool and fluid distension of the cecum and ascending colon, potentially related to ileus or enteritis with partial obstruction also included in the differential but felt less likely; there is no obvious obstructing colon mass. 2. Generous appearing appendix in terms of size but no other features to suggest acute inflammatory process 3. Moderate hiatal hernia Electronically Signed   By: Jasmine Pang M.D.   On: 08/15/2019 20:49    Procedures Procedures (including critical care time)  Medications Ordered in ED Medications  sodium chloride 0.9 % bolus 500 mL (0 mLs Intravenous Stopped 07/20/2019 1937)  iohexol (OMNIPAQUE) 300 MG/ML solution  100 mL (100 mLs Intravenous Contrast Given 07/21/2019 1952)    ED Course  I have reviewed the triage vital signs and the nursing notes.  Pertinent labs & imaging results that were available during my care of the patient were reviewed by me and considered in my medical decision making (see chart for  details).    MDM Rules/Calculators/A&P                      84 year old [place] speaking female resents to the ED today complaining of epigastric abdominal pain status post endoscopy that occurred on Monday 04/05.  She has also had lack of appetite.  Has felt nauseated after eating or drinking anything.  Outpatient CT scan scheduled for 4/14 however family took patient to urgent care today for further eval and was sent here.  On arrival to the ED patient is afebrile, and nontachypneic however tachycardic. Pressure soft 103/74. Had fever of 100.4 4 days ago however none recently. Suspect pt's blood pressure is around her baseline given she is very frail. Tachycardia likely s/2 dehydration; will give fluids and reevalaute. Lactic acid added to labs.   CBC without leukocytosis today. Hgb stable and improved compared to baseline.  CMP with glucose 114. Bicarb 20; no gap. Creatinine 0.92. Total protein and albumin slightly decreased.  Lipase elevated at 155  Lactic acid within normal limits at 1.1  CT scan obtained - does show ileus today. Will consult GI for further reccs.   Discussed case with Dr. Havery Moros with GI who will evaluate pt in the morning. Recommends NG tube currently and medicine admission.   Discussed case with Internal Medicine Service who agrees to accept patient for admission.   This note was prepared using Dragon voice recognition software and may include unintentional dictation errors due to the inherent limitations of voice recognition software.  Final Clinical Impression(s) / ED Diagnoses Final diagnoses:  Ileus ([place])  Epigastric pain  Lack of appetite    Rx / DC Orders ED Discharge Orders    None        Eustaquio Maize, PA-C 08/14/2019 2241    Charlesetta Shanks, MD 08/05/19 [phone]

## 2019-07-27 ENCOUNTER — Inpatient Hospital Stay (HOSPITAL_COMMUNITY): Payer: Medicare Other

## 2019-07-27 DIAGNOSIS — K56600 Partial intestinal obstruction, unspecified as to cause: Principal | ICD-10-CM

## 2019-07-27 DIAGNOSIS — Z8719 Personal history of other diseases of the digestive system: Secondary | ICD-10-CM

## 2019-07-27 DIAGNOSIS — E559 Vitamin D deficiency, unspecified: Secondary | ICD-10-CM

## 2019-07-27 DIAGNOSIS — Z888 Allergy status to other drugs, medicaments and biological substances status: Secondary | ICD-10-CM

## 2019-07-27 DIAGNOSIS — Z79891 Long term (current) use of opiate analgesic: Secondary | ICD-10-CM

## 2019-07-27 DIAGNOSIS — G8929 Other chronic pain: Secondary | ICD-10-CM

## 2019-07-27 DIAGNOSIS — Z79899 Other long term (current) drug therapy: Secondary | ICD-10-CM

## 2019-07-27 DIAGNOSIS — K449 Diaphragmatic hernia without obstruction or gangrene: Secondary | ICD-10-CM

## 2019-07-27 DIAGNOSIS — R918 Other nonspecific abnormal finding of lung field: Secondary | ICD-10-CM

## 2019-07-27 DIAGNOSIS — D509 Iron deficiency anemia, unspecified: Secondary | ICD-10-CM

## 2019-07-27 DIAGNOSIS — M40204 Unspecified kyphosis, thoracic region: Secondary | ICD-10-CM

## 2019-07-27 DIAGNOSIS — K2100 Gastro-esophageal reflux disease with esophagitis, without bleeding: Secondary | ICD-10-CM

## 2019-07-27 DIAGNOSIS — K5900 Constipation, unspecified: Secondary | ICD-10-CM

## 2019-07-27 DIAGNOSIS — K2270 Barrett's esophagus without dysplasia: Secondary | ICD-10-CM

## 2019-07-27 DIAGNOSIS — E43 Unspecified severe protein-calorie malnutrition: Secondary | ICD-10-CM

## 2019-07-27 DIAGNOSIS — G4700 Insomnia, unspecified: Secondary | ICD-10-CM

## 2019-07-27 DIAGNOSIS — M810 Age-related osteoporosis without current pathological fracture: Secondary | ICD-10-CM

## 2019-07-27 DIAGNOSIS — M549 Dorsalgia, unspecified: Secondary | ICD-10-CM

## 2019-07-27 LAB — COMPREHENSIVE METABOLIC PANEL
ALT: 16 U/L (ref 0–44)
AST: 22 U/L (ref 15–41)
Albumin: 2.6 g/dL — ABNORMAL LOW (ref 3.5–5.0)
Alkaline Phosphatase: 45 U/L (ref 38–126)
Anion gap: 12 (ref 5–15)
BUN: 21 mg/dL (ref 8–23)
CO2: 16 mmol/L — ABNORMAL LOW (ref 22–32)
Calcium: 6.2 mg/dL — CL (ref 8.9–10.3)
Chloride: 108 mmol/L (ref 98–111)
Creatinine, Ser: 0.89 mg/dL (ref 0.44–1.00)
Glucose, Bld: 101 mg/dL — ABNORMAL HIGH (ref 70–99)
Potassium: 4.6 mmol/L (ref 3.5–5.1)
Sodium: 136 mmol/L (ref 135–145)
Total Bilirubin: 0.7 mg/dL (ref 0.3–1.2)
Total Protein: 5.3 g/dL — ABNORMAL LOW (ref 6.5–8.1)

## 2019-07-27 LAB — MAGNESIUM: Magnesium: 2.1 mg/dL (ref 1.7–2.4)

## 2019-07-27 LAB — CBC
HCT: 28.9 % — ABNORMAL LOW (ref 36.0–46.0)
Hemoglobin: 9.2 g/dL — ABNORMAL LOW (ref 12.0–15.0)
MCH: 31.2 pg (ref 26.0–34.0)
MCHC: 31.8 g/dL (ref 30.0–36.0)
MCV: 98 fL (ref 80.0–100.0)
Platelets: 267 10*3/uL (ref 150–400)
RBC: 2.95 MIL/uL — ABNORMAL LOW (ref 3.87–5.11)
RDW: 14 % (ref 11.5–15.5)
WBC: 5.6 10*3/uL (ref 4.0–10.5)
nRBC: 0 % (ref 0.0–0.2)

## 2019-07-27 LAB — SARS CORONAVIRUS 2 (TAT 6-24 HRS): SARS Coronavirus 2: NEGATIVE

## 2019-07-27 MED ORDER — RAMELTEON 8 MG PO TABS
8.0000 mg | ORAL_TABLET | Freq: Every day | ORAL | Status: DC
  Administered 2019-07-27 – 2019-07-30 (×4): 8 mg via ORAL
  Filled 2019-07-27 (×8): qty 1

## 2019-07-27 MED ORDER — HYDROMORPHONE HCL 1 MG/ML IJ SOLN
0.5000 mg | Freq: Four times a day (QID) | INTRAMUSCULAR | Status: DC | PRN
  Administered 2019-07-27 – 2019-08-02 (×15): 0.5 mg via INTRAVENOUS
  Filled 2019-07-27 (×16): qty 1

## 2019-07-27 MED ORDER — IOHEXOL 300 MG/ML  SOLN
75.0000 mL | Freq: Once | INTRAMUSCULAR | Status: AC | PRN
  Administered 2019-07-27: 15:00:00 75 mL via INTRAVENOUS

## 2019-07-27 MED ORDER — ONDANSETRON HCL 4 MG/2ML IJ SOLN
4.0000 mg | Freq: Four times a day (QID) | INTRAMUSCULAR | Status: DC | PRN
  Administered 2019-08-01 – 2019-08-02 (×4): 4 mg via INTRAVENOUS
  Filled 2019-07-27 (×4): qty 2

## 2019-07-27 MED ORDER — POLYETHYLENE GLYCOL 3350 17 G PO PACK: 17.0000 g | PACK | Freq: Every day | ORAL | Status: DC

## 2019-07-27 MED ORDER — POLYETHYLENE GLYCOL 3350 17 G PO PACK
17.0000 g | PACK | Freq: Two times a day (BID) | ORAL | Status: DC
  Administered 2019-07-27 – 2019-08-04 (×9): 17 g via ORAL
  Filled 2019-07-27 (×12): qty 1

## 2019-07-27 MED ORDER — BOOST / RESOURCE BREEZE PO LIQD CUSTOM
1.0000 | Freq: Two times a day (BID) | ORAL | Status: DC
  Administered 2019-07-27 – 2019-07-28 (×3): 1 via ORAL

## 2019-07-27 MED ORDER — CALCIUM GLUCONATE-NACL 2-0.675 GM/100ML-% IV SOLN
2.0000 g | Freq: Once | INTRAVENOUS | Status: AC
  Administered 2019-07-27: 12:00:00 2000 mg via INTRAVENOUS
  Filled 2019-07-27: qty 100

## 2019-07-27 NOTE — Progress Notes (Signed)
Subjective: Patient was seen and evaluated at bedside during morning rounds today.  Interpreter assisted communication with patient Patient's daughter is in the room.  She reports some improvement in her abdominal pain today, no nausea or vomiting.  Patient and her daughter are concerned about stopping her tramadol that was the only medicine that worked for her leg pain we try to explain that tramadol will make her abdominal pain, constipation and probable ileus worse.  All her questions and concerns were addressed.  We also discussed finding on chest x-ray suspicious for lung nodule and recommended CT chest.  Patient's and her daughter are agreeable with plan.  They ask for starting diet for patient.  Explained that we would like to keep her n.p.o. for probable ileus.   Objective:  Vital signs in last 24 hours: Vitals:   07/27/19 0617 07/27/19 0627 07/27/19 1232 07/27/19 1422  BP:  137/83 121/69 109/62  Pulse:  (!) 109 90 95  Resp:  20 18 16   Temp:  98.4 F (36.9 C) 98.5 F (36.9 C) 98.6 F (37 C)  TempSrc:  Oral Oral Oral  SpO2:  99% 98% 98%  Weight: 40.4 kg     Height: 5' (1.524 m)      Constitutional: Well-developed and well-nourished. No acute distress.  HENT:  Head: Normocephalic and atraumatic, wearing mask Cardiovascular: RRR, nl S1S2, no murmur,  no Simerson Respiratory: Effort normal and breath sounds normal. No respiratory distress. No wheezes.  GI: Soft. Bowel sounds are normal. No distension. There is no tenderness.  Neurological: Is alert and oriented x 3  Skin: Not diaphoretic. No erythema.  Psychiatric: Normal mood and affect. Behavior is normal. Judgment and thought content normal.    Assessment/Plan:  Principal Problem:   Partial intestinal obstruction (HCC) Active Problems:   Ileus (HCC)   Constipation   Lung mass  Physical exam: General: Thin appearing lady, lying in bed in no acute distress CV: RRR, normal S1-S2, no lower extremity edema Respiratory: No  respiratory distress, at room air, no crackle, wheeze or rhonchi Abdominal exam: Abdomen is soft, nondistended, BS decreased but present Extremities: Pulses are palpable bilaterally, well perfused   Ileus in setting of opiate use/post EGD, versus partial small bowel obstruction:   and iron deficiency anemia, however patient has been very hesitant to pursue invasive work up. CT abdomen on admission showed a dilated fluid filled small bowel and terminal ileum with significant stool burden in the ascending colon concerning for partial obstruction.  Concerning for malignancy, metastatic cancer, also has pulmonary nodule/opacity on chest x-ray. -N.p.o. -Received IV fluid in ED -Attempted NG tube placement at ED for decompression.  Unsuccessful. -Keep hold tramadol -Trying to avoid opioid, however we do not have many options for her pain control given history of GERD, Barrett's esophagitis, decided to avoid Toradol.  Also cannot give p.o. pain medication given keeping n.p.o. Will Low-dose of as needed IV Dilaudid but will try to avoid further opioid -NG tube placement by ED provider was unsuccessful. (Post NG tube chest x-ray showed coiled tube).  Appreciate GI assistance decision regarding inserting another NG tube or core track -Cardiac monitoring -Monitor vital sign -May consider colonoscopy if possible to give bowel regiment. -Appreciate GI recommendation  Pulmonary mass: Iron deficiency anemia: Incidental finding of pulmonary mass on chest x-ray Concerning for malignancy  -Will perform CT chest for further evaluation -Patient and her daughter are not interested to pursue invasive work-up. But they agree with performing CT chest.  Hypocalcemia:  Corrected calcium is 7.3 Patient reports some cramps Ionized calcium is pending  Chvostek test is negative Kidney function normal -Patient has prior normal vitamin D -Giving IV calcium gluconate -We will check magnesium and  PTH  GERD/Gastritis/Barret's Esophagus: -Continue high dose PPI, omeprazole 40 mg  -Trying to avoid NSAID  Insomnia: -will hold amitryptyline given anticholinergic properties -will give ramelteon  Prior to Admission Living Arrangement: Anticipated Discharge Location: Likely home. To be determined Dispo: Anticipated discharge depends on clinical improvement and plan  Chevis Pretty, MD 07/27/2019, 5:05 PM Pager: @MYPAGER @

## 2019-07-27 NOTE — ED Notes (Signed)
Informed admitting of pt intolerance for NG tube placement. Pt not throwing up at this time. VSS. Instructed to hold off on placement until AM/GI consult.

## 2019-07-27 NOTE — Plan of Care (Signed)

## 2019-07-27 NOTE — ED Notes (Signed)
Admitting paged to RN per her request 

## 2019-07-27 NOTE — ED Notes (Signed)
This RN and Shanda Bumps, RN attempted NG tube placement. Translator was used during entire procedure. Pt did not tolerate well, refusing NG tube placement at this time. Will notify admitting team.

## 2019-07-27 NOTE — Consult Note (Signed)
Consultation  Referring Provider: Medicine service/[facility]  primary Care Physician:  Theotis Barrio, MD Primary Gastroenterologist:  Dr. [name]  Reason for Consultation: Bowel obstruction  HPI: Allison Rojas is a 84 y.o. female [place] female, non-English speaking known to Dr. [name], who recently underwent upper endoscopy on 07/22/2019 for complaints of abdominal pain and iron deficiency anemia.  This showed a medium sized hiatal hernia and was otherwise unremarkable. She was scheduled for CT of the abdomen and pelvis which was to be done next week.  I do not believe she is ever had prior colonoscopy. Patient presented to the emergency room yesterday due to increasing complaints of abdominal pain over the past week.  Per the patient's daughter she has been having abdominal pain since December which has been fairly constant located in the mid abdomen and getting progressively worse with time.  She really has not had much to eat at all over the past week as p.o. intake causes increased abdominal discomfort.  She has not had any nausea or vomiting.  She has lost weight but they are uncertain of the amount. She has not had any bowel movements over the past 8 days at all and prior to that was having normal bowel movements. Work-up in the emergency room yesterday with CT of the abdomen and pelvis with contrast showed normal-appearing pancreas, no gallstones, there are dilated fluid-filled loops of small bowel to 3.8 cm to the terminal ileum with stool and fluid distention of the cecum and ascending colon, with more normal appearing colon starting at the transverse.  Differential includes ileus partial obstruction, no obvious obstructing colon mass.  She does have a generous appearing appendix no inflammatory process  Patient has not had any prior abdominal surgeries. Labs on admit WBC of 5.0 hemoglobin 10.4 hematocrit of 32.6 Hemoglobin has been in the 8-10 range.  She has had a recent iron deficiency and  is on oral iron supplementation.  Ferritin in November 2020 was 13, up to 194 in February. Lactate 1.1 LFTs within normal limits  Today hemoglobin 9.2 hematocrit of 28.3   Past Medical History:  Diagnosis Date  . Allergy   . Chronic low back pain   . Compression fracture    T9 and T12 noted 05/2012 imaging. Followed by pain clinic   . Fall    Jan or Feb 2014   . Gastritis   . GERD (gastroesophageal reflux disease)   . Insomnia   . Irregular heart rate   . Osteoporosis   . Peptic ulcer disease   . Rib pain   . Right wrist fracture    3 - 4 monthes ago    Past Surgical History:  Procedure Laterality Date  . NO PAST SURGERIES      Prior to Admission medications   Medication Sig Start Date End Date Taking? Authorizing Provider  acetaminophen (TYLENOL) 500 MG tablet Take 1 tablet (500 mg total) by mouth 3 (three) times daily. Patient taking differently: Take 1,000 mg by mouth 3 (three) times daily as needed (for dental pain).  07/24/13  Yes Bobbye Charleston, MD  amitriptyline (ELAVIL) 50 MG tablet Take 1 tablet (50 mg total) by mouth at bedtime. 02/20/19  Yes [name], MD  famotidine (PEPCID) 20 MG tablet TAKE ONE TABLET BY MOUTH TWO TIMES A DAY AS NEEDED FOR HEARTBURN OR INDIGESTION Patient taking differently: Take 20 mg by mouth at bedtime.  02/20/19  Yes [name], MD  fluticasone (FLONASE) 50 MCG/ACT nasal spray  Place 2 sprays into both nostrils daily as needed for rhinitis. For allergies Patient taking differently: Place 2 sprays into both nostrils daily as needed for allergies or rhinitis.  02/20/19  Yes [name], MD  levocetirizine (XYZAL) 5 MG tablet Take 5 mg by mouth daily as needed for allergies.   Yes [provider]  omeprazole (PRILOSEC) 40 MG capsule Take medicine 30 minutes before breakfast Patient taking differently: Take 40 mg by mouth daily before breakfast. Take medicine 30 minutes before breakfast 07/22/19  Yes Rachael Fee, MD  PROLIA 60 MG/ML SOSY injection Inject 60 mg into the skin every 6 (six) months. 05/10/19  Yes [name], MD  traMADol (ULTRAM) 50 MG tablet Take 1 tablet (50 mg total) by mouth 2 (two) times daily as needed for moderate pain or severe pain. Patient taking differently: Take 50 mg by mouth in the morning and at bedtime.  06/12/19  Yes [name], MD  diclofenac sodium (VOLTAREN) 1 % GEL Apply 4 g topically 4 (four) times daily. Patient not taking: Reported on 07/25/2019 11/12/15   Lora Paula, MD  FEROSUL 325 (65 Fe) MG tablet TAKE ONE TABLET BY MOUTH EVERY MORNING WITH BREAKFAST Patient taking differently: Take 325 mg by mouth daily with breakfast.  07/09/19   Burns Spain, MD    Current Facility-Administered Medications  Medication Dose Route Frequency Provider Last Rate Last Admin  . enoxaparin (LOVENOX) injection 30 mg  30 mg Subcutaneous Daily Agyei, Obed K, MD      . HYDROmorphone (DILAUDID) injection 0.5 mg  0.5 mg Intravenous Q6H PRN Yvette Rack, MD   0.5 mg at 07/27/19 1129  . lactated ringers infusion   Intravenous Continuous Yvette Rack, MD 100 mL/hr at 07/27/19 0546 New Bag at 07/27/19 0546  . ondansetron (ZOFRAN) injection 4 mg  4 mg Intravenous Q6H PRN Agyei, Obed K, MD      . pantoprazole (PROTONIX) injection 40 mg  40 mg Intravenous Q24H Agyei, Obed K, MD   40 mg at 07/27/19 0119  . ramelteon (ROZEREM) tablet 8 mg  8 mg Oral QHS Yvette Rack, MD   Stopped at 07/27/19 0540    Allergies as of 07/25/2019 - Review Complete 08/10/2019  Allergen Reaction Noted  . Amitriptyline Other (See Comments) 08/10/2012    Family History  Problem Relation Age of Onset  . Colon cancer Neg Hx   . Esophageal cancer Neg Hx   . Rectal cancer Neg Hx   . Stomach cancer Neg Hx     Social History   Socioeconomic History  . Marital status: Widowed    Spouse name: Not on file  . Number of children: 1  . Years of education: Not on file  . Highest education  level: Not on file  Occupational History  . Not on file  Tobacco Use  . Smoking status: Never Smoker  . Smokeless tobacco: Never Used  Substance and Sexual Activity  . Alcohol use: No  . Drug use: No  . Sexual activity: Never  Other Topics Concern  . Not on file  Social History Narrative   Financial assistance approved for 100% discount at [facility] and has [facility] card per [name]   11/30/2009   She is widowed, she has 2 children (only admits to 1 daughter now 07/2012), she does not smoke cigarettes or drink alcohol   Lives with daughter Brendi Mccarroll [phone]  Social Determinants of Health   Financial Resource Strain:   . Difficulty of Paying Living Expenses:   Food Insecurity:   . Worried About Charity fundraiser in the Last Year:   . Arboriculturist in the Last Year:   Transportation Needs:   . Film/video editor (Medical):   Marland Kitchen Lack of Transportation (Non-Medical):   Physical Activity:   . Days of Exercise per Week:   . Minutes of Exercise per Session:   Stress:   . Feeling of Stress :   Social Connections:   . Frequency of Communication with Friends and Family:   . Frequency of Social Gatherings with Friends and Family:   . Attends Religious Services:   . Active Member of Clubs or Organizations:   . Attends Archivist Meetings:   Marland Kitchen Marital Status:   Intimate Partner Violence:   . Fear of Current or Ex-Partner:   . Emotionally Abused:   Marland Kitchen Physically Abused:   . Sexually Abused:     Review of Systems: Pertinent positive and negative review of systems were noted in the above HPI section.  All other review of systems was otherwise negative.  Physical Exam: Vital signs in last 24 hours: Temp:  [98.4 F (36.9 C)-98.5 F (36.9 C)] 98.5 F (36.9 C) (04/10 1232) Pulse Rate:  [90-109] 90 (04/10 1232) Resp:  [18-20] 18 (04/10 1232) BP: (100-142)/(54-83) 121/69 (04/10 1232) SpO2:  [97 %-100 %] 98 % (04/10 1232) Weight:  [40.4 kg] 40.4 kg  (04/10 0617) Last BM Date: (PTA) General:   Alert,  Well-developed, very petite very thin elderly [place] female pleasant and cooperative in NAD.  Daughter at bedside who understand some [place]. Head:  Normocephalic and atraumatic. Eyes:  Sclera clear, no icterus.   Conjunctiva pink. Ears:  Normal auditory acuity. Nose:  No deformity, discharge,  or lesions. Mouth:  No deformity or lesions.   Neck:  Supple; no masses or thyromegaly. Lungs:  Clear throughout to auscultation.   No wheezes, crackles, or rhonchi. Heart:  Regular rate and rhythm; no murmurs, clicks, rubs,  or gallops. Abdomen:  Soft, not visibly distended or tympanic BS active,nonpalp mass or hsm.  She is tender in the mid abdomen and hypogastrium no rebound Rectal:  Deferred  Msk:  Symmetrical without gross deformities. . Pulses:  Normal pulses noted. Extremities:  Without clubbing or edema. Neurologic:  Alert and  oriented x4;  grossly normal neurologically. Skin:  Intact without significant lesions or rashes.. Psych:  Alert and cooperative. Normal mood and affect.  Intake/Output from previous day: 04/09 0701 - 04/10 0700 In: 500 [IV Piggyback:500] Out: 300 [Urine:300] Intake/Output this shift: No intake/output data recorded.  Lab Results: Recent Labs    08/15/2019 1253 07/27/19 0344  WBC 5.0 5.6  HGB 10.4* 9.2*  HCT 32.6* 28.9*  PLT 330 267   BMET Recent Labs    07/24/2019 1253 07/27/19 0344  NA 136 136  K 4.3 4.6  CL 107 108  CO2 20* 16*  GLUCOSE 114* 101*  BUN 18 21  CREATININE 0.92 0.89  CALCIUM 6.9* 6.2*   LFT Recent Labs    07/27/19 0344  PROT 5.3*  ALBUMIN 2.6*  AST 22  ALT 16  ALKPHOS 45  BILITOT 0.7   PT/INR No results for input(s): LABPROT, INR in the last 72 hours. Hepatitis Panel No results for input(s): HEPBSAG, HCVAB, HEPAIGM, HEPBIGM in the last 72 hours.   IMPRESSION:  #42 84 year old [place] female, non-English-speaking  with 3 to 54-month history of persistent mid abdominal  pain which has been progressive. She has had poor oral intake over the past week and no bowel movement over the past 8 days. EGD on 07/22/2019 showed a hiatal hernia starting findings. CT of the abdomen pelvis yesterday showing dilated small bowel loops in the distal small bowel and dilated cecum and ascending colon with transition to more normal-appearing diameter at the transverse colon.  There is a lot of stool throughout the colon.  No definite mass  I am concerned she has a partial obstruction of the colon probably at level of transverse colon.  I do not think this is an ileus as she has had ongoing pain over the past 3 months, rule out neoplasm. She is not completely obstructed and has not had any nausea or vomiting.  #2 iron deficiency anemia #3 prior history of peptic ulcer disease #4 history of compression fractures  Plan; start sips of clear liquids and resource [employer], as has no nutrition over the past 8 days Start MiraLAX 17 g in 8 ounces of water twice daily Tapwater enemas x2 this afternoon Plain abdominal films in a.m. Check CEA Eventually she will need colonoscopy this admission if we are able to gradually get her bowel cleaned out.  Interview was done with the video interpreter, patient and her daughter.  They understand and all questions were answered.     Amy EsterwoodPA-C  07/27/2019, 1:06 PM

## 2019-07-28 ENCOUNTER — Inpatient Hospital Stay (HOSPITAL_COMMUNITY): Payer: Medicare Other

## 2019-07-28 LAB — BASIC METABOLIC PANEL
Anion gap: 9 (ref 5–15)
BUN: 16 mg/dL (ref 8–23)
CO2: 19 mmol/L — ABNORMAL LOW (ref 22–32)
Calcium: 6.6 mg/dL — ABNORMAL LOW (ref 8.9–10.3)
Chloride: 109 mmol/L (ref 98–111)
Creatinine, Ser: 0.8 mg/dL (ref 0.44–1.00)
GFR calc Af Amer: >60 mL/min (ref >60)
GFR calc non Af Amer: >60 mL/min (ref >60)
Glucose, Bld: 78 mg/dL (ref 70–99)
Potassium: 4.6 mmol/L (ref 3.5–5.1)
Sodium: 137 mmol/L (ref 135–145)

## 2019-07-28 LAB — CEA: CEA: 3.5 ng/mL (ref 0.0–4.7)

## 2019-07-28 LAB — CBC
HCT: 25.5 % — ABNORMAL LOW (ref 36.0–46.0)
Hemoglobin: 8.2 g/dL — ABNORMAL LOW (ref 12.0–15.0)
MCH: 30.9 pg (ref 26.0–34.0)
MCHC: 32.2 g/dL (ref 30.0–36.0)
MCV: 96.2 fL (ref 80.0–100.0)
Platelets: 291 10*3/uL (ref 150–400)
RBC: 2.65 MIL/uL — ABNORMAL LOW (ref 3.87–5.11)
RDW: 14 % (ref 11.5–15.5)
WBC: 5.3 10*3/uL (ref 4.0–10.5)
nRBC: 0 % (ref 0.0–0.2)

## 2019-07-28 LAB — IRON AND TIBC
Iron: 23 ug/dL — ABNORMAL LOW (ref 28–170)
Saturation Ratios: 15 % (ref 10.4–31.8)
TIBC: 151 ug/dL — ABNORMAL LOW (ref 250–450)
UIBC: 128 ug/dL

## 2019-07-28 LAB — FERRITIN: Ferritin: 145 ng/mL (ref 11–307)

## 2019-07-28 MED ORDER — CALCIUM GLUCONATE-NACL 2-0.675 GM/100ML-% IV SOLN
2.0000 g | Freq: Once | INTRAVENOUS | Status: AC
  Administered 2019-07-28: 2000 mg via INTRAVENOUS
  Filled 2019-07-28: qty 100

## 2019-07-28 NOTE — Progress Notes (Addendum)
Subjective:   Pt was seen at the bedside today. Daughter present at the bedside. The patient states she still has some pain in her abdomen, but is improved overall. She was able to eat some vegetable broth, ice cream and drink some fluids yesterday without N/V. It did not increase her pain. She also had an enema yesterday which helped, and is currently passing gas. She expressed concerns about her iron levels as well as a colonoscopy. All questions and concerns were addressed to the patient's and daughter's satisfaction.  Interpreter services used.  Objective: CBC Latest Ref Rng & Units 07/27/2019 08/05/2019 06/12/2019  WBC 4.0 - 10.5 K/uL 5.6 5.0 5.2  Hemoglobin 12.0 - 15.0 g/dL 9.1(Y) 10.4(L) 9.9(L)  Hematocrit 36.0 - 46.0 % 28.9(L) 32.6(L) 31.6(L)  Platelets 150 - 400 K/uL 267 330 221   BMP Latest Ref Rng & Units 07/27/2019 07/30/2019 06/12/2019  Glucose 70 - 99 mg/dL 782(N) 562(Z) 308(M)  BUN 8 - 23 mg/dL 21 18 21   Creatinine 0.44 - 1.00 mg/dL 5.78 4.69  BUN/Creat Ratio 12 - 28 - - -  Sodium 135 - 145 mmol/L 136 136 141  Potassium 3.5 - 5.1 mmol/L 4.6 4.3 4.4  Chloride 98 - 111 mmol/L 108 107 107  CO2 22 - 32 mmol/L 16(L) 20(L) 27  Calcium 8.9 - 10.3 mg/dL 6.2(LL) 6.9(L) 8.8(L)   Vital signs in last 24 hours: Vitals:   07/27/19 1422 07/27/19 1817 07/27/19 2112 07/28/19 0344  BP: 109/62 (!) 117/56 109/62 116/67  Pulse: 95 94 (!) 103 100  Resp: 16 17 18 18   Temp: 98.6 F (37 C) 98.2 F (36.8 C) 98.1 F (36.7 C) 98.3 F (36.8 C)  TempSrc: Oral Oral Oral Oral  SpO2: 98% 100% 97% 94%  Weight:      Height:       Imaging: CT Chest: IMPRESSION: 1.  No suspicious right lung nodule. 2. Biapical pleural thickening with mild nodularity to the right apical pleural thickening. Patchy pleural thickening over the lateral upper lungs right worse than left. Focal density over the lateral right upper lung abutting the pleura and major fissure likely atelectasis/scarring. Recommend a  follow-up CT chest 3 months to document stability. 3. Patchy interstitial changes and bronchiectasis. Possible small amount of aspirate material over the right-side of the trachea just above the carina. 4.  Stable changes as described over the upper abdomen. 5.  Aortic Atherosclerosis (ICD10-I70.0). 6.  Stable spinal compression fractures.  Physical Exam General: Resting in bed comfortably, NAD HEENT: NCAT CV: Regular rate and rhythm, normal S1-S2 no murmurs rubs or gallops appreciated PULM: Auscultation bilaterally in all lung fields ABD: Soft and nondistended, mild tenderness to the epigastric region. NEURO: Alert and oriented, no focal deficits  Assessment/Plan:  Principal Problem:   Partial intestinal obstruction (HCC) Active Problems:   Ileus (HCC)   Constipation   Lung mass  In summary, Ms. Muro is an 84 y.o female with a hx of chronic back pain on Tramadol, iron deficiency anemia, vitamin D deficiency, osteoporosis, gastritis and Barrett's esophagus who presented with a progressive increase of abdominal pain. Imaging findings consistent with partial bowel obstruction.   #Partial small bowel obstruction: CT abdomen on admission showed a dilated fluid filled small bowel and terminal ileum with significant stool burden in the ascending colon concerning for partial obstruction.   Patient is tolerating clear liquids at this time.  GI is invovled and we appreciate recommendations. Currently holding home tramadol. -Appreciate GI recommendations  -Concern  for partial obstruction and ongoing iron deficiency may suggest malignancy.  Needs colonoscopy to rule out.  -MiraLAX twice daily  -Check CEA: within normal limits  IV Dilaudid 0.5 mg q6hrs PRN. Will try to avoid more opiates. -Telemetry  #Iron Deficiency Anemia: Patient takes oral iron at home, but in the setting of bowel obstruction may make constipation worse.  This has been discontinued.  Hemoglobin is down trended from 10.4, to  9.2, to 8.2 today. -Ferritin, iron and TIBC ordered  #Pulmonary Thickening: Incidental finding of pulmonary mass on chest x-ray. Concerning for malignancy but CT chest demonstrated pleural thickening, and no mass. -F/u CT in 3 months recommended   #Hypocalcemia: Corrected calcium is 7.7. Pt s/p calcium gluconate yesterday. -F/u PTH and Ionized calcium, currently pending -2 g calcium gluconate ordered  #Insomnia -Ramelteon nightly PRN  #GERD/Gastritis/Barret's Esophagus #FEN/GI -Diet: Clear liquids -Fluids: LR 100 cc/hr -IV Protonix 40 mg daily -Zofran 4 mg q6hrs PRN  #DVT prophylaxis -SCDs  #CODE STATUS: DNR  #Dispo: Anticipated discharge in 3-5 days Prior to Admission Living Arrangement: Home Anticipated Discharge Location: Home Barriers to Discharge: Ongoing medical workup  Earlene Plater, MD Internal Medicine, PGY1 Pager: [phone]  07/28/2019,10:25 AM

## 2019-07-28 NOTE — Plan of Care (Signed)
  Problem: Education: Goal: Knowledge of General Education information will improve Description: Including pain rating scale, medication(s)/side effects and non-pharmacologic comfort measures Outcome: Progressing   Problem: Health Behavior/Discharge Planning: Goal: Ability to manage health-related needs will improve Outcome: Progressing   Problem: Clinical Measurements: Goal: Ability to maintain clinical measurements within normal limits will improve Outcome: Progressing Goal: Will remain free from infection Outcome: Progressing Goal: Respiratory complications will improve Outcome: Progressing Goal: Cardiovascular complication will be avoided Outcome: Progressing   Problem: Elimination: Goal: Will not experience complications related to bowel motility Outcome: Progressing Goal: Will not experience complications related to urinary retention Outcome: Progressing   Problem: Pain Managment: Goal: General experience of comfort will improve Outcome: Progressing   Problem: Safety: Goal: Ability to remain free from injury will improve Outcome: Progressing   Problem: Skin Integrity: Goal: Risk for impaired skin integrity will decrease Outcome: Progressing

## 2019-07-28 NOTE — Progress Notes (Signed)
Patient ID: Allison Rojas, female   DOB: [birth year], 84 y.o.   MRN: [id number]    Progress Note   Subjective   Day # 2  CC; 3 to 27-month history of abdominal pain, increased with p.o.'s and admitted with partial bowel obstruction  Patient comfortable, daughter says she rates her pain as a 6, still located in the mid abdomen.  She has been able to take clear liquids, had very good results from 2 enemas yesterday, no further bowel movement since  Abdominal films today pending  CEA within normal limits   Objective   Vital signs in last 24 hours: Temp:  [98.1 F (36.7 C)-98.6 F (37 C)] 98.3 F (36.8 C) (04/11 0344) Pulse Rate:  [90-103] 100 (04/11 0344) Resp:  [16-18] 18 (04/11 0344) BP: (109-121)/(56-69) 116/67 (04/11 0344) SpO2:  [94 %-100 %] 94 % (04/11 0344) Last BM Date: 07/27/19 General:    Elderly [place] female in NAD smiling Heart:  Regular rate and rhythm; no murmurs Lungs: Respirations even and unlabored, lungs CTA bilaterally Abdomen:  Soft,  nondistended, tender across the mid abdomen, no guarding, no palpable mass, bowel sounds somewhat hyperactive Extremities:  Without edema. Neurologic:  Alert and oriented,  grossly normal neurologically. Psych:  Cooperative. Normal mood and affect.  Intake/Output from previous day: 04/10 0701 - 04/11 0700 In: 2352.4 [P.O.:180; I.V.:2172.4] Out: 1000 [Urine:1000] Intake/Output this shift: No intake/output data recorded.  Lab Results: Recent Labs    08/02/2019 1253 07/27/19 0344 07/28/19 0808  WBC 5.0 5.6 5.3  HGB 10.4* 9.2* 8.2*  HCT 32.6* 28.9* 25.5*  PLT 330 267 291   BMET Recent Labs    08/01/2019 1253 07/27/19 0344 07/28/19 0808  NA 136 136 137  K 4.3 4.6 4.6  CL 107 108 109  CO2 20* 16* 19*  GLUCOSE 114* 101* 78  BUN 18 21 16   CREATININE 0.92 0.89 0.80  CALCIUM 6.9* 6.2* 6.6*   LFT Recent Labs    07/27/19 0344  PROT 5.3*  ALBUMIN 2.6*  AST 22  ALT 16  ALKPHOS 45  BILITOT 0.7   PT/INR No results  for input(s): LABPROT, INR in the last 72 hours.  Studies/Results: DG Abd 1 View  Result Date: 07/27/2019 CLINICAL DATA:  NG tube placement EXAM: ABDOMEN - 1 VIEW COMPARISON:  07/27/2019, CT 07/30/2019 FINDINGS: Subsegmental atelectasis left base. Possible peripheral opacity in the right upper lung. Esophageal tube is coiled upon itself over the distal esophagus. Air distended bowel in the upper abdomen. Normal heart size. IMPRESSION: 1. Esophageal tube is coiled upon itself in the region of distal esophagus 2. Possible spiculated peripheral opacity in the right upper lobe. Recommend dedicated chest CT for further evaluation Electronically Signed   By: 09/25/2019 M.D.   On: 07/27/2019 01:35   CT CHEST W CONTRAST  Result Date: 07/27/2019 CLINICAL DATA:  Possible nodule over the lateral right upper lobe on recent chest x-ray. EXAM: CT CHEST WITH CONTRAST TECHNIQUE: Multidetector CT imaging of the chest was performed during intravenous contrast administration. CONTRAST:  70mL OMNIPAQUE IOHEXOL 300 MG/ML  SOLN COMPARISON:  CT abdomen 07/23/2019.  Chest x-ray earlier today. FINDINGS: Cardiovascular: Heart is normal size. Mild calcified plaque over the descending thoracic aorta. Remaining vascular structures are unremarkable. Mediastinum/Nodes: No mediastinal or hilar adenopathy. Small hiatal hernia is present unchanged. Lungs/Pleura: Lungs are adequately inflated. There is biapical pleural thickening with some nodular component as well as focal calcification over the right apex. There is no suspicious pulmonary nodule over  the lateral right upper lobe, although there is patchy pleural thickening over the lateral right upper lung. There is mild focal opacification over the lateral right upper lobe adjacent the major fissure which extends to the pleural surface and likely due to atelectasis/scarring. Minimal focal bronchiectasis over the lingula and inferior medial right middle lobe. Subtle peripheral  interstitial prominence over the lung bases. Minimal patchy pleural/subpleural density over the lateral left upper lobe. Possible debris along the right lateral wall of the distal trachea just above the carina which may be due to aspiration. Upper Abdomen: Calcified plaque over the abdominal aorta. Bili excretion of contrast via the gallbladder. Left renal cyst unchanged. No other significant changes. Musculoskeletal: Stable moderate compression fractures of T9, T12 and L1. IMPRESSION: 1.  No suspicious right lung nodule. 2. Biapical pleural thickening with mild nodularity to the right apical pleural thickening. Patchy pleural thickening over the lateral upper lungs right worse than left. Focal density over the lateral right upper lung abutting the pleura and major fissure likely atelectasis/scarring. Recommend a follow-up CT chest 3 months to document stability. 3. Patchy interstitial changes and bronchiectasis. Possible small amount of aspirate material over the right-side of the trachea just above the carina. 4.  Stable changes as described over the upper abdomen. 5.  Aortic Atherosclerosis (ICD10-I70.0). 6.  Stable spinal compression fractures. Electronically Signed   By: Marin Olp M.D.   On: 07/27/2019 16:59   CT Abdomen Pelvis W Contrast  Addendum Date: 07/27/2019   ADDENDUM REPORT: 07/27/2019 23:45 ADDENDUM: Additional clinical information. Patient has history of chronic anemia and weight loss. Concern for colon mass. The patient's images are re reviewed. Again noted are dilated fluid-filled distal small bowel loops with dilated stool and fluid-filled cecum and ascending colon. Note that the colon is very tortuous and redundant and difficult to follow in its entirety. Moderate stool in the imaged portions of transverse colon without transverse colon distension. Decompressed left colon and rectosigmoid colon with small amount of stool in these regions. No definitive colon mass identified, though some  caliber change in the colon at the hepatic flexure, raising possibility of distal obstruction. Correlation with colonoscopy if/when clinically feasible should be considered. Electronically Signed   By: Donavan Foil M.D.   On: 07/27/2019 23:45   Result Date: 07/27/2019 CLINICAL DATA:  Abdominal pain history of recent endoscopy EXAM: CT ABDOMEN AND PELVIS WITH CONTRAST TECHNIQUE: Multidetector CT imaging of the abdomen and pelvis was performed using the standard protocol following bolus administration of intravenous contrast. CONTRAST:  175mL OMNIPAQUE IOHEXOL 300 MG/ML  SOLN COMPARISON:  CT 01/24/2018 FINDINGS: Lower chest: Lung bases demonstrate mild bronchiectasis within the right middle lobe and lingula. No acute consolidation or pleural effusion. Cardiac size within normal limits. Small moderate hiatal hernia. Hepatobiliary: No focal liver abnormality is seen. No gallstones, gallbladder wall thickening, or biliary dilatation. Pancreas: Unremarkable. No pancreatic ductal dilatation or surrounding inflammatory changes. Spleen: Normal in size without focal abnormality. Adrenals/Urinary Tract: Adrenal glands are within normal limits. Cyst in the mid left kidney. Negative for hydronephrosis. The bladder is unremarkable. Stomach/Bowel: The stomach is nonenlarged. Dilated fluid-filled distal small bowel loops measuring up to 3.3 cm; small bowel is dilated to the terminal ileum. There is stool and fluid distension of the cecum and ascending colon with transition to more normal caliber stool filled transverse and left colon. No obvious mass lesion. Generous sized appendix measuring up to 9.5 mm without wall thickening or definitive inflammatory change. Vascular/Lymphatic: Moderate aortic atherosclerosis without  aneurysm. No suspicious adenopathy. Reproductive: Uterus and adnexa are unremarkable. Other: Gas collection in the left upper quadrant, series 3, image number 24 appears intraluminal and likely splenic flexure.  No definitive free air. No definitive free fluid. Hazy appearance of the mesentery. Musculoskeletal: Kyphosis of the spine with chronic compression fractures at T12 and L1. IMPRESSION: 1. Dilated fluid-filled distal small bowel and terminal ileum with stool and fluid distension of the cecum and ascending colon, potentially related to ileus or enteritis with partial obstruction also included in the differential but felt less likely; there is no obvious obstructing colon mass. 2. Generous appearing appendix in terms of size but no other features to suggest acute inflammatory process 3. Moderate hiatal hernia Electronically Signed: By: Jasmine Pang M.D. On: 08/10/2019 20:49   DG Abd Portable 1 View  Result Date: 07/27/2019 CLINICAL DATA:  Nasogastric tube placement. EXAM: PORTABLE ABDOMEN - 1 VIEW COMPARISON:  None. FINDINGS: A nasogastric tube is seen with its distal tip noted at the gastroesophageal junction. This appears to sit within a small hiatal hernia. The bowel gas pattern is normal. No radio-opaque calculi or other significant radiographic abnormality are seen. IMPRESSION: 1. Nasogastric tube positioning, as described above. Further advancement of the NG tube by approximately 7 cm is recommended to decrease the risk of aspiration. Electronically Signed   By: Aram Candela M.D.   On: 07/27/2019 01:08       Assessment / Plan:    #31 84 year old [place] female with 31-month history of persistent mid abdominal pain, progressive and associated with poor p.o. intake weight loss, and on admit no bowel movement x8 days  Recent EGD unrevealing CT on admit.  Dilated ileum with stool-filled cecum dilated up to 8 cm or so, redundant colon with some distention up to the distal transverse colon then more normal caliber colon.  Rule out colonic malignancy with partial obstruction  #2 iron deficiency anemia  Plan; continue clear liquid diet, and resource supplement MiraLAX 17 g in 8 ounces of water  twice daily to slowly purge bowel Follow-up abdominal films today If she has improvement in obstructive signs on x-ray, we may be able to slowly start prepping bowel tomorrow and plan for colonoscopy on Tuesday or Wednesday.      Principal Problem:   Partial intestinal obstruction (HCC) Active Problems:   Ileus (HCC)   Constipation   Lung mass     LOS: 2 days   Haddie Bruhl PA-C 07/28/2019, 11:18 AM

## 2019-07-29 ENCOUNTER — Inpatient Hospital Stay (HOSPITAL_COMMUNITY): Payer: Medicare Other

## 2019-07-29 DIAGNOSIS — Z66 Do not resuscitate: Secondary | ICD-10-CM

## 2019-07-29 DIAGNOSIS — R933 Abnormal findings on diagnostic imaging of other parts of digestive tract: Secondary | ICD-10-CM

## 2019-07-29 LAB — CBC
HCT: 25.5 % — ABNORMAL LOW (ref 36.0–46.0)
Hemoglobin: 8.2 g/dL — ABNORMAL LOW (ref 12.0–15.0)
MCH: 31.1 pg (ref 26.0–34.0)
MCHC: 32.2 g/dL (ref 30.0–36.0)
MCV: 96.6 fL (ref 80.0–100.0)
Platelets: 290 10*3/uL (ref 150–400)
RBC: 2.64 MIL/uL — ABNORMAL LOW (ref 3.87–5.11)
RDW: 13.8 % (ref 11.5–15.5)
WBC: 5.5 10*3/uL (ref 4.0–10.5)
nRBC: 0 % (ref 0.0–0.2)

## 2019-07-29 LAB — BASIC METABOLIC PANEL
Anion gap: 11 (ref 5–15)
BUN: 16 mg/dL (ref 8–23)
CO2: 18 mmol/L — ABNORMAL LOW (ref 22–32)
Calcium: 6.9 mg/dL — ABNORMAL LOW (ref 8.9–10.3)
Chloride: 106 mmol/L (ref 98–111)
Creatinine, Ser: 0.87 mg/dL (ref 0.44–1.00)
GFR calc Af Amer: >60 mL/min (ref >60)
GFR calc non Af Amer: >60 mL/min (ref >60)
Glucose, Bld: 64 mg/dL — ABNORMAL LOW (ref 70–99)
Potassium: 4.3 mmol/L (ref 3.5–5.1)
Sodium: 135 mmol/L (ref 135–145)

## 2019-07-29 LAB — PARATHYROID HORMONE, INTACT (NO CA): PTH: 207 pg/mL — ABNORMAL HIGH (ref 15–65)

## 2019-07-29 LAB — CALCIUM, IONIZED

## 2019-07-29 LAB — PHOSPHORUS: Phosphorus: 1.9 mg/dL — ABNORMAL LOW (ref 2.5–4.6)

## 2019-07-29 MED ORDER — CALCIUM GLUCONATE-NACL 1-0.675 GM/50ML-% IV SOLN
1.0000 g | Freq: Once | INTRAVENOUS | Status: AC
  Administered 2019-07-29: 12:00:00 1000 mg via INTRAVENOUS
  Filled 2019-07-29: qty 50

## 2019-07-29 MED ORDER — SODIUM CHLORIDE 0.9 % IV SOLN
510.0000 mg | Freq: Once | INTRAVENOUS | Status: AC
  Administered 2019-07-29: 09:00:00 510 mg via INTRAVENOUS
  Filled 2019-07-29: qty 17

## 2019-07-29 NOTE — Progress Notes (Signed)
Progress Note    ASSESSMENT AND PLAN:   84 yo [place] female with osteoporosis, Barrett's esophagus chronic back pain.   # Abdominal pain / weight loss /anemia / CT scan scan raising some concern for a distal colon obstruction.  --we started slow bowel purge yesterday with BID miralax but she refused a couple of doses. Didn't take last night's dose because it might affect her sleep. Only 1 BM in since enema two days ago.  --Explained importance of taking Miralax to start bowel purge.  --Patient agrees to take am dose of Miralax now. I will go ahead and follow with a second dose. If does okay this then we may fully purge her this evening for procedure tomorrow. Otherwise, continue slow prep. The colonoscopy / risks / benefits were explained to patient and daughter through [place] interpreter. --Continue clear liquid diet --Hgb low but stable at 8.2  --Had EGD on 07/22/19 for evaluation of above symptoms. At the time daughter concerned patient couldn't tolerate a colonoscopy.   # Barrett's esophagus, recently diagnosed -continue PPI   SUBJECTIVE    Via [place] interpreter :   Intermittent pain around belly button. No nausea. Has had only one BM since enema two days ago.    OBJECTIVE:    EGD 07/22/19 A medium-sized hiatal hernia was present with mild associated gastric edema, inflammation within the hernia segment. - Slightly irregular Z line, biopsied to check for neoplasm. - Mild (LA Grade A) reflux related esophagitis. - The examination was otherwise normal  Vital signs in last 24 hours: Temp:  [98.8 F (37.1 C)-99.4 F (37.4 C)] 99.4 F (37.4 C) (04/12 0448) Pulse Rate:  [91-103] 103 (04/12 0448) Resp:  [17-18] 17 (04/12 0448) BP: (96-117)/(62-72) 117/64 (04/12 0448) SpO2:  [95 %-99 %] 98 % (04/12 0448) Last BM Date: 07/27/19 General:   Alert, thin female in NAD Heart:  Regular rate and rhythm;  No lower extremity edema   Pulm: Normal respiratory effort   Abdomen:   Soft, mildly distended, nontender.  Normal bowel sounds.          Neurologic:  Alert and  oriented x4;  grossly normal neurologically. Psych:  Pleasant, cooperative.  Normal mood and affect.   Intake/Output from previous day: 04/11 0701 - 04/12 0700 In: 2376.7 [P.O.:1080; I.V.:1296.7] Out: 500 [Urine:500] Intake/Output this shift: Total I/O In: 200 [P.O.:200] Out: -   Lab Results: Recent Labs    07/27/19 0344 07/28/19 0808 07/29/19 0327  WBC 5.6 5.3 5.5  HGB 9.2* 8.2* 8.2*  HCT 28.9* 25.5* 25.5*  PLT 267 291 290   BMET Recent Labs    07/27/19 0344 07/28/19 0808 07/29/19 0327  NA 136 137 135  K 4.6 4.6 4.3  CL 108 109 106  CO2 16* 19* 18*  GLUCOSE 101* 78 64*  BUN 21 16 16   CREATININE 0.89 0.80 0.87  CALCIUM 6.2* 6.6* 6.9*   LFT Recent Labs    07/27/19 0344  PROT 5.3*  ALBUMIN 2.6*  AST 22  ALT 16  ALKPHOS 45  BILITOT 0.7   PT/INR No results for input(s): LABPROT, INR in the last 72 hours. Hepatitis Panel No results for input(s): HEPBSAG, HCVAB, HEPAIGM, HEPBIGM in the last 72 hours.  CT CHEST W CONTRAST  Result Date: 07/27/2019 CLINICAL DATA:  Possible nodule over the lateral right upper lobe on recent chest x-ray. EXAM: CT CHEST WITH CONTRAST TECHNIQUE: Multidetector CT imaging of the chest was performed during intravenous contrast administration. CONTRAST:  37mL OMNIPAQUE IOHEXOL 300 MG/ML  SOLN COMPARISON:  CT abdomen 07/22/2019.  Chest x-ray earlier today. FINDINGS: Cardiovascular: Heart is normal size. Mild calcified plaque over the descending thoracic aorta. Remaining vascular structures are unremarkable. Mediastinum/Nodes: No mediastinal or hilar adenopathy. Small hiatal hernia is present unchanged. Lungs/Pleura: Lungs are adequately inflated. There is biapical pleural thickening with some nodular component as well as focal calcification over the right apex. There is no suspicious pulmonary nodule over the lateral right upper lobe, although there is  patchy pleural thickening over the lateral right upper lung. There is mild focal opacification over the lateral right upper lobe adjacent the major fissure which extends to the pleural surface and likely due to atelectasis/scarring. Minimal focal bronchiectasis over the lingula and inferior medial right middle lobe. Subtle peripheral interstitial prominence over the lung bases. Minimal patchy pleural/subpleural density over the lateral left upper lobe. Possible debris along the right lateral wall of the distal trachea just above the carina which may be due to aspiration. Upper Abdomen: Calcified plaque over the abdominal aorta. Bili excretion of contrast via the gallbladder. Left renal cyst unchanged. No other significant changes. Musculoskeletal: Stable moderate compression fractures of T9, T12 and L1. IMPRESSION: 1.  No suspicious right lung nodule. 2. Biapical pleural thickening with mild nodularity to the right apical pleural thickening. Patchy pleural thickening over the lateral upper lungs right worse than left. Focal density over the lateral right upper lung abutting the pleura and major fissure likely atelectasis/scarring. Recommend a follow-up CT chest 3 months to document stability. 3. Patchy interstitial changes and bronchiectasis. Possible small amount of aspirate material over the right-side of the trachea just above the carina. 4.  Stable changes as described over the upper abdomen. 5.  Aortic Atherosclerosis (ICD10-I70.0). 6.  Stable spinal compression fractures. Electronically Signed   By: Elberta Fortis M.D.   On: 07/27/2019 16:59   DG Abd 2 Views  Result Date: 07/28/2019 CLINICAL DATA:  84 year old female with bowel obstruction. EXAM: ABDOMEN - 2 VIEW COMPARISON:  Abdominal radiograph dated 07/27/2019 and CT dated 08/16/2019 FINDINGS: There is a persistent dilatation of small-bowel loops in the mid abdomen measuring up to 3.5 cm in diameter. There is air in the colon. No free air identified.  Right upper lobe subpleural scarring noted. There is osteopenia with multilevel degenerative changes of the spine and mild lumbar dextroscoliosis. T12 and L1 compression fractures as seen on the prior CT. No new fracture. IMPRESSION: Persistent dilatation of small-bowel loops in the mid abdomen. Findings may represent residual obstruction or ileus. Electronically Signed   By: Elgie Collard M.D.   On: 07/28/2019 15:32     Principal Problem:   Partial intestinal obstruction (HCC) Active Problems:   Ileus (HCC)   Constipation   Lung mass     LOS: 3 days   Willette Cluster ,NP 07/29/2019, 10:43 AM

## 2019-07-29 NOTE — Progress Notes (Signed)
Subjective:   Pt was seen at the bedside today. Daughter present at the bedside. Pt denies having any BM yesterday. She still has some intermittent pain in the center of her abdomen. Her daughter is concerned about her nutrition intake. We explained the sooner we can get her bowels clear, the sooner we can start focusing on increasing her PO intake. All concerns were addressed.   Interpreter services used.  Objective: CBC Latest Ref Rng & Units 07/29/2019 07/28/2019 07/27/2019  WBC 4.0 - 10.5 K/uL 5.5 5.3 5.6  Hemoglobin 12.0 - 15.0 g/dL 8.2(L) 8.2(L) 9.2(L)  Hematocrit 36.0 - 46.0 % 25.5(L) 25.5(L) 28.9(L)  Platelets 150 - 400 K/uL 290 291 267   BMP Latest Ref Rng & Units 07/29/2019 07/28/2019 07/27/2019  Glucose 70 - 99 mg/dL 64(L) 78 101(H)  BUN 8 - 23 mg/dL 16 16 21   Creatinine 0.44 - 1.00 mg/dL 0.87 0.80 0.89  BUN/Creat Ratio 12 - 28 - - -  Sodium 135 - 145 mmol/L 135 137 136  Potassium 3.5 - 5.1 mmol/L 4.3 4.6 4.6  Chloride 98 - 111 mmol/L 106 109 108  CO2 22 - 32 mmol/L 18(L) 19(L) 16(L)  Calcium 8.9 - 10.3 mg/dL 6.9(L) 6.6(L) 6.2(LL)   Vital signs in last 24 hours: Vitals:   07/28/19 0344 07/28/19 1409 07/28/19 2103 07/29/19 0448  BP: 116/67 96/72 106/62 117/64  Pulse: 100 91 98 (!) 103  Resp: 18 17 18 17   Temp: 98.3 F (36.8 C) 98.8 F (37.1 C) 99.3 F (37.4 C) 99.4 F (37.4 C)  TempSrc: Oral Oral Oral Oral  SpO2: 94% 99% 95% 98%  Weight:      Height:       Physical Exam General: Laying in bed HEENT: NCAT CV: RRR, normal S1 & S2, no MRG PULM: Normal WOB ABD: Soft, generalized tenderness in all quadrants NEURO: AxO, no focal deficits  Assessment/Plan:  Principal Problem:   Partial intestinal obstruction (HCC) Active Problems:   Ileus (HCC)   Constipation   Lung mass  In summary, Allison Rojas is an 84 y.o female with a hx of chronic back pain on Tramadol, iron deficiency anemia, vitamin D deficiency, osteoporosis, gastritis and Barrett's esophagus who  presented with a progressive increase of abdominal pain. Imaging findings consistent with partial bowel obstruction.   #Partial small bowel obstruction: CT abdomen on admission showed a dilated fluid filled small bowel and terminal ileum with significant stool burden in the ascending colon concerning for partial obstruction.   Patient is tolerating clear liquids at this time.  GI is invovled and we appreciate recommendations. Currently holding home tramadol. -Appreciate GI recommendations  -Concern for partial obstruction and ongoing iron deficiency may suggest malignancy.  Needs colonoscopy to rule out.  -MiraLAX BID -May consider magnesium citrate, will defer to GI  IV Dilaudid 0.5 mg q6hrs PRN. Will try to avoid more opiates. Pt has been using it consistently  -Telemetry  #Iron Deficiency Anemia: Patient takes oral iron at home, but in the setting of bowel obstruction may make constipation worse.  This has been discontinued.  Hemoglobin is down trended from 10.4, to 9.2, to 8.2 today. Iron studies demonstrate low iron levels. -Feraheme ordered  #Pulmonary Thickening: Incidental finding of pulmonary mass on chest x-ray. Concerning for malignancy but CT chest demonstrated pleural thickening, and no mass. -F/u CT in 3 months recommended   #Elevated PTH #Hypocalcemia: PTH elevated to 207, ionized calcium pending. -F/u Ionized calcium, currently pending -Add on phosphorous level  #Insomnia -  Ramelteon nightly PRN  #GERD/Gastritis/Barret's Esophagus #FEN/GI -Diet: Clear liquids -Fluids: LR 100 cc/hr -IV Protonix 40 mg daily -Zofran 4 mg q6hrs PRN  #DVT prophylaxis -SCDs  #CODE STATUS: DNR  #Dispo: Anticipated discharge in 3-5 days Prior to Admission Living Arrangement: Home Anticipated Discharge Location: Home Barriers to Discharge: Ongoing medical workup  Kirt Boys, MD Internal Medicine, PGY1 Pager: [phone]  07/29/2019,7:43 AM

## 2019-07-29 NOTE — Social Work (Signed)
CSW stopped by pt daughter in the hallway with a question regarding coverage for hospital stay as pt has Medicare and Medicaid. Pt granddaughter was on the phone inquiring as well. CSW introduced self and role to granddaughter. Provided hospital number to contact for billing department. Explained that management of billing etc is often not completed until after the pt is discharged. Services will process under her Medicare and Medicaid and then any additional costs will be billed to pt. CSW encouraged pt granddaughter to call billing to inquire about any other items they could request including itemized billing which may help explain if there are any additional costs from hospital stay.   Octavio Graves, MSW, LCSW [facility] Clinical Social Work

## 2019-07-29 NOTE — Progress Notes (Addendum)
Inpatient Diabetes Program Recommendations  AACE/ADA: New Consensus Statement on Inpatient Glycemic Control (2015)  Target Ranges:  Prepandial:   less than 140 mg/dL      Peak postprandial:   less than 180 mg/dL (1-2 hours)      Critically ill patients:  140 - 180 mg/dL   Lab Results  Component Value Date   GLUCAP 92 06/04/2018   HGBA1C 5.2 06/04/2018    Review of Glycemic Control Results for XOEY, WARMOTH (MRN [id number]) as of 07/29/2019 12:56  Ref. Range 07/29/2019 03:27  Glucose Latest Ref Range: 70 - 99 mg/dL 64 (L)   Diabetes history: No hx DM Outpatient Diabetes medications: none Current orders for Inpatient glycemic control: none  Inpatient Diabetes Program Recommendations:    Consider adding A1C, as previous from 2020.   Additionally, noted that AM serum was < 70 mg/dL. Consider checking CBGs TID & Hs.   Thanks, Lujean Rave, MSN, RNC-OB Diabetes Coordinator [phone] (8a-5p)

## 2019-07-29 NOTE — Progress Notes (Signed)
Pt. daughter would like a little more clarification on the plan of care for her mother. She states that her mother cannot tolerate an enema and would like to know if her mother needs one to have the colonoscopy. She also would like to know when she is going to have the colonoscopy.

## 2019-07-30 ENCOUNTER — Inpatient Hospital Stay (HOSPITAL_COMMUNITY): Payer: Medicare Other

## 2019-07-30 DIAGNOSIS — R935 Abnormal findings on diagnostic imaging of other abdominal regions, including retroperitoneum: Secondary | ICD-10-CM

## 2019-07-30 DIAGNOSIS — K56600 Partial intestinal obstruction, unspecified as to cause: Secondary | ICD-10-CM | POA: Diagnosis not present

## 2019-07-30 DIAGNOSIS — K5900 Constipation, unspecified: Secondary | ICD-10-CM | POA: Diagnosis not present

## 2019-07-30 LAB — BASIC METABOLIC PANEL
Anion gap: 15 (ref 5–15)
BUN: 22 mg/dL (ref 8–23)
CO2: 13 mmol/L — ABNORMAL LOW (ref 22–32)
Calcium: 7 mg/dL — ABNORMAL LOW (ref 8.9–10.3)
Chloride: 103 mmol/L (ref 98–111)
Creatinine, Ser: 0.92 mg/dL (ref 0.44–1.00)
GFR calc Af Amer: >60 mL/min (ref >60)
GFR calc non Af Amer: 58 mL/min — ABNORMAL LOW (ref >60)
Glucose, Bld: 83 mg/dL (ref 70–99)
Potassium: 4.5 mmol/L (ref 3.5–5.1)
Sodium: 131 mmol/L — ABNORMAL LOW (ref 135–145)

## 2019-07-30 LAB — CBC
HCT: 27.2 % — ABNORMAL LOW (ref 36.0–46.0)
Hemoglobin: 8.6 g/dL — ABNORMAL LOW (ref 12.0–15.0)
MCH: 30.4 pg (ref 26.0–34.0)
MCHC: 31.6 g/dL (ref 30.0–36.0)
MCV: 96.1 fL (ref 80.0–100.0)
Platelets: 317 10*3/uL (ref 150–400)
RBC: 2.83 MIL/uL — ABNORMAL LOW (ref 3.87–5.11)
RDW: 13.6 % (ref 11.5–15.5)
WBC: 7.8 10*3/uL (ref 4.0–10.5)
nRBC: 0 % (ref 0.0–0.2)

## 2019-07-30 MED ORDER — SODIUM PHOSPHATES 45 MMOLE/15ML IV SOLN
30.0000 mmol | Freq: Once | INTRAVENOUS | Status: AC
  Administered 2019-07-30: 30 mmol via INTRAVENOUS
  Filled 2019-07-30: qty 10

## 2019-07-30 MED ORDER — PEG-KCL-NACL-NASULF-NA ASC-C 100 G PO SOLR
0.5000 | ORAL | Status: DC
  Filled 2019-07-30: qty 1

## 2019-07-30 MED ORDER — MAGNESIUM CITRATE PO SOLN
0.5000 | Freq: Once | ORAL | Status: AC
  Administered 2019-07-30: 0.5 via ORAL
  Filled 2019-07-30: qty 296

## 2019-07-30 MED ORDER — ADULT MULTIVITAMIN W/MINERALS CH
1.0000 | ORAL_TABLET | Freq: Every day | ORAL | Status: DC
  Administered 2019-08-01 – 2019-08-02 (×2): 1 via ORAL
  Filled 2019-07-30 (×2): qty 1

## 2019-07-30 MED ORDER — PEG-KCL-NACL-NASULF-NA ASC-C 100 G PO SOLR
1.0000 | Freq: Once | ORAL | Status: AC
  Administered 2019-07-31: 04:00:00 200 g via ORAL
  Filled 2019-07-30: qty 1

## 2019-07-30 MED ORDER — CALCIUM GLUCONATE-NACL 2-0.675 GM/100ML-% IV SOLN
2.0000 g | Freq: Once | INTRAVENOUS | Status: AC
  Administered 2019-07-30: 2000 mg via INTRAVENOUS
  Filled 2019-07-30 (×2): qty 100

## 2019-07-30 NOTE — Progress Notes (Signed)
Initial Nutrition Assessment  DOCUMENTATION CODES:   Underweight  INTERVENTION:   -Continue Boost Breeze po BID, each supplement provides 250 kcal and 9 grams of protein -MVI with minerals daily -RD will follow for diet advancement and adjust supplement regimen as appropriate  NUTRITION DIAGNOSIS:   Inadequate oral intake related to altered GI function as evidenced by per patient/family report.  GOAL:   Patient will meet greater than or equal to 90% of their needs  MONITOR:   PO intake, Supplement acceptance, Diet advancement, Labs, Weight trends, Skin, I & O's  REASON FOR ASSESSMENT:   Consult Assessment of nutrition requirement/status  ASSESSMENT:   Ms. Martinezgarcia is a pleasant 84 y/o [place] woman with PMHx significant for chronic back pain on Tramadol, iron deficiency anemia, vitamin D deficiency, Osteoporosis, Gastritis and Barrett's esophagus who presented to the ED with one week of abdominal pain.  Pt admitted with abdominal pain.   Reviewed I/O's: +3.2 L x 24 hours and +6.6 L since admission  Attempted to speak with pt, however, sleeping soundly at time of visit. Also attempted to speak with pt via phone x 2, however, no answer.   Pt is on a clear liquid diet and tolerating well. Noted meal completion 100%. Pt is also receiving laxatives to help get her bowels moving. Per GI notes, plan for possible colonoscopy tomorrow.   Reviewed wt hx; wt has been stable over the past year.  Medications reviewed and include lactated ringers infusion @ 100 ml/hr.   Labs reviewed: Na: 131.   Diet Order:   Diet Order            Diet clear liquid Room service appropriate? Yes; Fluid consistency: Thin  Diet effective now              EDUCATION NEEDS:   Not appropriate for education at this time  Skin:  Skin Assessment: Reviewed RN Assessment  Last BM:  07/29/19  Height:   Ht Readings from Last 1 Encounters:  07/27/19 5' (1.524 m)    Weight:   Wt Readings from Last  1 Encounters:  07/27/19 40.4 kg    Ideal Body Weight:  45.5 kg  BMI:  Body mass index is 17.39 kg/m.  Estimated Nutritional Needs:   Kcal:  1200-1400  Protein:  55-70 grams  Fluid:  > 1.2 L    Levada Schilling, RD, LDN, CDCES Registered Dietitian II Certified Diabetes Care and Education Specialist Please refer to [facility] for RD and/or RD on-call/weekend/after hours pager

## 2019-07-30 NOTE — Progress Notes (Addendum)
Progress Note    ASSESSMENT AND PLAN:   84 yo [place] female with osteoporosis, Barrett's esophagus chronic back pain.   # Abdominal pain / weight loss /anemia / CT scan scan raising some concern for a distal colon obstruction.  --we started slow bowel purge a couple of days ago. Drinking BID Miralax and got enema yesterday. Spoke with patient and daughter via interpreter. She did have some return after enema but no BMs since.   --Today's abdominal films shows slight decrease in gas filled small bowel.  --Tolerating the Miralax. Will try a dose of Moviprep now and check back this pm.  --Continue clear liquid diet --Hgb low but stable at 8.6  --Had EGD on 07/22/19 for evaluation of above symptoms. At the time daughter concerned patient couldn't tolerate a colonoscopy.   # Barrett's esophagus, recently diagnosed -continue PPI     SUBJECTIVE   Feel okay today. Tolerating miralax. No BM since enema yesterday    OBJECTIVE:     Vital signs in last 24 hours: Temp:  [98.2 F (36.8 C)-99.7 F (37.6 C)] 98.2 F (36.8 C) (04/13 0510) Pulse Rate:  [101-109] 101 (04/13 0510) Resp:  [14-18] 14 (04/13 0510) BP: (103-131)/(54-71) 103/54 (04/13 0510) SpO2:  [94 %-100 %] 96 % (04/13 0510) Last BM Date: 07/29/19 General:   Alert, thin female in NAD Heart:  Regular rate and rhythm;  1+ BLE edema   Pulm: Normal respiratory effort   Abdomen:  Soft, mildly distended, nontender.  Normal bowel sounds.          Neurologic:  Alert and  oriented x4;  grossly normal neurologically. Psych:  Pleasant, cooperative.  Normal mood and affect.   Intake/Output from previous day: 04/12 0701 - 04/13 0700 In: 3225.5 [P.O.:200; I.V.:3025.5] Out: -  Intake/Output this shift: Total I/O In: -  Out: 500 [Urine:500]  Lab Results: Recent Labs    07/28/19 0808 07/29/19 0327 07/30/19 0237  WBC 5.3 5.5 7.8  HGB 8.2* 8.2* 8.6*  HCT 25.5* 25.5* 27.2*  PLT 291 290 317   BMET Recent Labs   07/28/19 0808 07/29/19 0327 07/30/19 0237  NA 137 135 131*  K 4.6 4.3 4.5  CL 109 106 103  CO2 19* 18* 13*  GLUCOSE 78 64* 83  BUN 16 16 22   CREATININE 0.80 0.87 0.92  CALCIUM 6.6* 6.9* 7.0*    DG Abd 2 Views  Result Date: 07/30/2019 CLINICAL DATA:  Abdominal pain, nausea, vomiting EXAM: ABDOMEN - 2 VIEW COMPARISON:  07/29/2019 FINDINGS: Slight interval decrease in gas-filled small bowel throughout the abdomen, with gas present in the colon to the splenic flexure. No free air in the abdomen. IMPRESSION: Slight interval decrease in gas-filled small bowel throughout the abdomen, with gas present in the colon to the splenic flexure. No free air in the abdomen. Electronically Signed   By: 09/28/2019 M.D.   On: 07/30/2019 09:41   DG Abd 2 Views  Result Date: 07/29/2019 CLINICAL DATA:  Bowel obstruction EXAM: ABDOMEN - 2 VIEW COMPARISON:  CT 08/11/2019 FINDINGS: Gaseous distention of bowel again noted, including the right colon, stable since prior CT. No free air organomegaly. IMPRESSION: Stable dilated bowel loops including small bowel and right colon. No change since prior CT. Electronically Signed   By: 09/25/2019 M.D.   On: 07/29/2019 10:56   DG Abd 2 Views  Result Date: 07/28/2019 CLINICAL DATA:  84 year old female with bowel obstruction. EXAM: ABDOMEN - 2 VIEW COMPARISON:  Abdominal radiograph dated 07/27/2019 and CT dated 08/01/2019 FINDINGS: There is a persistent dilatation of small-bowel loops in the mid abdomen measuring up to 3.5 cm in diameter. There is air in the colon. No free air identified. Right upper lobe subpleural scarring noted. There is osteopenia with multilevel degenerative changes of the spine and mild lumbar dextroscoliosis. T12 and L1 compression fractures as seen on the prior CT. No new fracture. IMPRESSION: Persistent dilatation of small-bowel loops in the mid abdomen. Findings may represent residual obstruction or ileus. Electronically Signed   By: Anner Crete  M.D.   On: 07/28/2019 15:32     Principal Problem:   Partial intestinal obstruction (HCC) Active Problems:   Ileus ([place])   Constipation   Lung mass     LOS: 4 days   Tye Savoy ,NP 07/30/2019, 10:19 AM

## 2019-07-30 NOTE — Plan of Care (Signed)
  Problem: Education: Goal: Knowledge of General Education information will improve Description: Including pain rating scale, medication(s)/side effects and non-pharmacologic comfort measures Outcome: Progressing   Problem: Health Behavior/Discharge Planning: Goal: Ability to manage health-related needs will improve Outcome: Progressing   Problem: Clinical Measurements: Goal: Ability to maintain clinical measurements within normal limits will improve Outcome: Progressing Goal: Will remain free from infection Outcome: Progressing Goal: Respiratory complications will improve Outcome: Progressing Goal: Cardiovascular complication will be avoided Outcome: Progressing   Problem: Activity: Goal: Risk for activity intolerance will decrease Outcome: Progressing   Problem: Coping: Goal: Level of anxiety will decrease Outcome: Progressing   Problem: Elimination: Goal: Will not experience complications related to urinary retention Outcome: Progressing   Problem: Pain Managment: Goal: General experience of comfort will improve Outcome: Progressing   Problem: Safety: Goal: Ability to remain free from injury will improve Outcome: Progressing   Problem: Skin Integrity: Goal: Risk for impaired skin integrity will decrease Outcome: Progressing   

## 2019-07-30 NOTE — Progress Notes (Signed)
Subjective:   Pt was seen at the bedside today. She had an episode of vomiting overnight after drinking bowel prep. Patient notes feeling better this morning. She denies any ongoing nausea. She has not passed any flatus this morning. She notes continued epigastric pain extending up to her sternum.  We discussed that the Miralax is important to clean out her bowels. Daughter notes that she does not wish for her mother to get any suppositories or enema and prefers her getting oral medications only. Discussed trying magnesium citrate and pt and daughter is in agreement. All questions and concerns addressed.   Interpreter services used.  Objective: CBC Latest Ref Rng & Units 07/30/2019 07/29/2019 07/28/2019  WBC 4.0 - 10.5 K/uL 7.8 5.5 5.3  Hemoglobin 12.0 - 15.0 g/dL 8.6(L) 8.2(L) 8.2(L)  Hematocrit 36.0 - 46.0 % 27.2(L) 25.5(L) 25.5(L)  Platelets 150 - 400 K/uL 317 290 291   BMP Latest Ref Rng & Units 07/30/2019 07/29/2019 07/28/2019  Glucose 70 - 99 mg/dL 83 64(L) 78  BUN 8 - 23 mg/dL 22 16 16   Creatinine 0.44 - 1.00 mg/dL 0.92 0.87 0.80  BUN/Creat Ratio 12 - 28 - - -  Sodium 135 - 145 mmol/L 131(L) 135 137  Potassium 3.5 - 5.1 mmol/L 4.5 4.3 4.6  Chloride 98 - 111 mmol/L 103 106 109  CO2 22 - 32 mmol/L 13(L) 18(L) 19(L)  Calcium 8.9 - 10.3 mg/dL 7.0(L) 6.9(L) 6.6(L)   Vital signs in last 24 hours: Vitals:   07/29/19 0448 07/29/19 1459 07/29/19 1956 07/30/19 0510  BP: 117/64 131/71 112/66 (!) 103/54  Pulse: (!) 103 (!) 105 (!) 109 (!) 101  Resp: 17 17 18 14   Temp: 99.4 F (37.4 C) 99.7 F (37.6 C) 99.2 F (37.3 C) 98.2 F (36.8 C)  TempSrc: Oral Oral Oral Oral  SpO2: 98% 94% 100% 96%  Weight:      Height:       Physical Exam General: Resting comfortably in bed HEENT: NCAT CV: RRR, normal S1-S2 no murmurs rubs or gallops appreciated PULM: Clear to auscultation bilaterally ABD: Soft, mild tenderness in all quadrants NEURO: Alert and oriented, no focal deficits  noted  Assessment/Plan:  Principal Problem:   Partial intestinal obstruction (HCC) Active Problems:   Ileus (HCC)   Constipation   Lung mass  In summary, Allison Rojas is an 84 y.o female with a hx of chronic back pain on Tramadol, iron deficiency anemia, vitamin D deficiency, osteoporosis, gastritis and Barrett's esophagus who presented with a progressive increase of abdominal pain. Imaging findings consistent with partial bowel obstruction.   #Partial small bowel obstruction: CT abdomen on admission showed a dilated fluid filled small bowel and terminal ileum with significant stool burden in the ascending colon concerning for partial obstruction.   Patient is tolerating clear liquids at this time.  GI is invovled and we appreciate recommendations. Currently holding home tramadol. -Appreciate GI recommendations  -Concern for partial obstruction and ongoing iron deficiency may suggest malignancy.  Needs colonoscopy to rule out.  -MiraLAX BID   -1/2 bottle magnesium citrate ordered, discussed with GI  IV Dilaudid 0.5 mg q6hrs PRN. Will try to avoid more opiates. Pt has been using it consistently  -Telemetry  #Iron Deficiency Anemia: Patient takes oral iron at home, but in the setting of bowel obstruction may make constipation worse.  This has been discontinued.  Hemoglobin is down trended from 10.4, to 9.2, to 8.2 today. Iron studies demonstrate low iron levels. S/p feraheme x1.  #  Pulmonary Thickening: Incidental finding of pulmonary mass on chest x-ray. Concerning for malignancy but CT chest demonstrated pleural thickening, and no mass. -F/u CT in 3 months recommended   #Elevated PTH #Hypophosphatemia #Hypocalcemia: PTH elevated to 207, ionized calcium was unable to be obtained. Phosphate low at 1.8 today. -Replete phosphate -Replete calcium  #Insomnia -Ramelteon nightly PRN  #GERD/Gastritis/Barret's Esophagus #FEN/GI -Diet: Clear liquids -Fluids: LR 100 cc/hr -IV Protonix 40 mg  daily -Zofran 4 mg q6hrs PRN -Consult to dietitian   #DVT prophylaxis -SCDs  #CODE STATUS: DNR  #Dispo:  Prior to Admission Living Arrangement: Home Anticipated Discharge Location: Home Barriers to Discharge: Ongoing medical workup  Kirt Boys, MD Internal Medicine, PGY1 Pager: [phone]  07/30/2019,11:50 AM

## 2019-07-31 ENCOUNTER — Inpatient Hospital Stay (HOSPITAL_COMMUNITY): Payer: Medicare Other | Admitting: Certified Registered Nurse Anesthetist

## 2019-07-31 ENCOUNTER — Ambulatory Visit (HOSPITAL_COMMUNITY): Admission: RE | Admit: 2019-07-31 | Payer: Medicare Other | Source: Ambulatory Visit

## 2019-07-31 ENCOUNTER — Encounter (HOSPITAL_COMMUNITY): Admission: EM | Disposition: E | Payer: Self-pay | Source: Home / Self Care | Attending: Internal Medicine

## 2019-07-31 ENCOUNTER — Encounter (HOSPITAL_COMMUNITY): Payer: Self-pay | Admitting: Internal Medicine

## 2019-07-31 DIAGNOSIS — R195 Other fecal abnormalities: Secondary | ICD-10-CM

## 2019-07-31 DIAGNOSIS — R600 Localized edema: Secondary | ICD-10-CM

## 2019-07-31 DIAGNOSIS — K5909 Other constipation: Secondary | ICD-10-CM

## 2019-07-31 DIAGNOSIS — E210 Primary hyperparathyroidism: Secondary | ICD-10-CM

## 2019-07-31 DIAGNOSIS — R194 Change in bowel habit: Secondary | ICD-10-CM

## 2019-07-31 HISTORY — PX: COLONOSCOPY: SHX5424

## 2019-07-31 LAB — BASIC METABOLIC PANEL
Anion gap: 12 (ref 5–15)
BUN: 17 mg/dL (ref 8–23)
CO2: 20 mmol/L — ABNORMAL LOW (ref 22–32)
Calcium: 7 mg/dL — ABNORMAL LOW (ref 8.9–10.3)
Chloride: 102 mmol/L (ref 98–111)
Creatinine, Ser: 0.77 mg/dL (ref 0.44–1.00)
GFR calc Af Amer: >60 mL/min (ref >60)
GFR calc non Af Amer: >60 mL/min (ref >60)
Glucose, Bld: 120 mg/dL — ABNORMAL HIGH (ref 70–99)
Potassium: 4 mmol/L (ref 3.5–5.1)
Sodium: 134 mmol/L — ABNORMAL LOW (ref 135–145)

## 2019-07-31 LAB — CBC
HCT: 25 % — ABNORMAL LOW (ref 36.0–46.0)
Hemoglobin: 8.2 g/dL — ABNORMAL LOW (ref 12.0–15.0)
MCH: 30.4 pg (ref 26.0–34.0)
MCHC: 32.8 g/dL (ref 30.0–36.0)
MCV: 92.6 fL (ref 80.0–100.0)
Platelets: 341 10*3/uL (ref 150–400)
RBC: 2.7 MIL/uL — ABNORMAL LOW (ref 3.87–5.11)
RDW: 13.7 % (ref 11.5–15.5)
WBC: 7.7 10*3/uL (ref 4.0–10.5)
nRBC: 0 % (ref 0.0–0.2)

## 2019-07-31 SURGERY — COLONOSCOPY
Anesthesia: Monitor Anesthesia Care

## 2019-07-31 MED ORDER — PEG 3350-KCL-NABCB-NACL-NASULF 236 G PO SOLR
4000.0000 mL | Freq: Once | ORAL | Status: AC
  Administered 2019-08-01: 4000 mL via ORAL
  Filled 2019-07-31: qty 4000

## 2019-07-31 MED ORDER — DEXTROSE-NACL 5-0.45 % IV SOLN: INTRAVENOUS | Status: DC

## 2019-07-31 MED ORDER — LIDOCAINE HCL (CARDIAC) PF 100 MG/5ML IV SOSY
PREFILLED_SYRINGE | INTRAVENOUS | Status: DC | PRN
  Administered 2019-07-31: 100 mg via INTRATRACHEAL

## 2019-07-31 MED ORDER — LACTATED RINGERS IV SOLN: INTRAVENOUS | Status: DC | PRN

## 2019-07-31 MED ORDER — PROPOFOL 10 MG/ML IV BOLUS
INTRAVENOUS | Status: DC | PRN
  Administered 2019-07-31: 20 mg via INTRAVENOUS

## 2019-07-31 MED ORDER — BISACODYL 5 MG PO TBEC
10.0000 mg | DELAYED_RELEASE_TABLET | Freq: Every day | ORAL | Status: AC
  Administered 2019-08-01: 10:00:00 10 mg via ORAL
  Filled 2019-07-31 (×3): qty 2

## 2019-07-31 MED ORDER — SODIUM CHLORIDE 0.9 % IV SOLN: INTRAVENOUS | Status: DC

## 2019-07-31 MED ORDER — PROPOFOL 500 MG/50ML IV EMUL
INTRAVENOUS | Status: DC | PRN
  Administered 2019-07-31: 100 ug/kg/min via INTRAVENOUS

## 2019-07-31 MED ORDER — PHENYLEPHRINE HCL (PRESSORS) 10 MG/ML IV SOLN
INTRAVENOUS | Status: DC | PRN
  Administered 2019-07-31 (×2): 120 ug via INTRAVENOUS
  Administered 2019-07-31 (×2): 80 ug via INTRAVENOUS

## 2019-07-31 NOTE — Anesthesia Preprocedure Evaluation (Signed)
Anesthesia Evaluation  Patient identified by MRN, date of birth, ID band Patient awake    Reviewed: Allergy & Precautions, NPO status , Patient's Chart, lab work & pertinent test results  Airway Mallampati: I  TM Distance: >3 FB Neck ROM: Full    Dental   Pulmonary    Pulmonary exam normal        Cardiovascular Normal cardiovascular exam     Neuro/Psych    GI/Hepatic GERD  Controlled and Medicated,  Endo/Other    Renal/GU Renal InsufficiencyRenal disease     Musculoskeletal   Abdominal   Peds  Hematology   Anesthesia Other Findings   Reproductive/Obstetrics                             Anesthesia Physical Anesthesia Plan  ASA: III  Anesthesia Plan: MAC   Post-op Pain Management:    Induction:   PONV Risk Score and Plan: 2  Airway Management Planned: Nasal Cannula  Additional Equipment:   Intra-op Plan:   Post-operative Plan:   Informed Consent: I have reviewed the patients History and Physical, chart, labs and discussed the procedure including the risks, benefits and alternatives for the proposed anesthesia with the patient or authorized representative who has indicated his/her understanding and acceptance.       Plan Discussed with: CRNA and Surgeon  Anesthesia Plan Comments:         Anesthesia Quick Evaluation

## 2019-07-31 NOTE — Anesthesia Postprocedure Evaluation (Signed)
Anesthesia Post Note  Patient: Allison Rojas  Procedure(s) Performed: COLONOSCOPY (N/A )     Patient location during evaluation: PACU Anesthesia Type: MAC Level of consciousness: awake and alert Pain management: pain level controlled Vital Signs Assessment: post-procedure vital signs reviewed and stable Respiratory status: spontaneous breathing, nonlabored ventilation, respiratory function stable and patient connected to nasal cannula oxygen Cardiovascular status: stable and blood pressure returned to baseline Postop Assessment: no apparent nausea or vomiting Anesthetic complications: no    Last Vitals:  Vitals:   08/07/2019 1458 08/12/2019 1546  BP: 131/67 123/68  Pulse: (!) 101 99  Resp: 18 18  Temp:  36.7 C  SpO2: 93% 95%    Last Pain:  Vitals:   08/07/2019 1546  TempSrc: Oral  PainSc:                  [name] DAVID

## 2019-07-31 NOTE — Progress Notes (Signed)
Pt. vomited 600 mL of brown colored emesis. On call MD made aware.

## 2019-07-31 NOTE — Progress Notes (Addendum)
Subjective:   Pt was seen at the bedside today. Pt has not had a bowel movement after taking 1/2 bottle of Mg citrate. She tried drinking colonoscopy prep this AM and vomited it up after an hour. Pt continues to have abdominal pain and using dilaudid around the clock. We explained the that we are stuck in a cycle, that using more opiates will make the constipation worse.  We discussed the possibility of using methylnatlrexone. Pt is willing to try. She says she does not want enemas.   Interpreter services used.  Objective: CBC Latest Ref Rng & Units 08/06/2019 07/30/2019 07/29/2019  WBC 4.0 - 10.5 K/uL 7.7 7.8 5.5  Hemoglobin 12.0 - 15.0 g/dL 8.2(L) 8.6(L) 8.2(L)  Hematocrit 36.0 - 46.0 % 25.0(L) 27.2(L) 25.5(L)  Platelets 150 - 400 K/uL 341 317 290   BMP Latest Ref Rng & Units 08/05/2019 07/30/2019 07/29/2019  Glucose 70 - 99 mg/dL 563(J) 83 49(F)  BUN 8 - 23 mg/dL 17 22 16   Creatinine 0.44 - 1.00 mg/dL 0.26 3.78  BUN/Creat Ratio 12 - 28 - - -  Sodium 135 - 145 mmol/L 134(L) 131(L) 135  Potassium 3.5 - 5.1 mmol/L 4.0 4.5 4.3  Chloride 98 - 111 mmol/L 102 103 106  CO2 22 - 32 mmol/L 20(L) 13(L) 18(L)  Calcium 8.9 - 10.3 mg/dL 7.0(L) 7.0(L) 6.9(L)   Vital signs in last 24 hours: Vitals:   07/30/19 1311 07/30/19 2141 07/24/2019 0403 08/07/2019 0903  BP: 106/67 129/61 (!) 119/50 115/67  Pulse: (!) 108 (!) 105 (!) 51 (!) 104  Resp: 18 16 16 18   Temp: 98.2 F (36.8 C) 98.3 F (36.8 C) 98.4 F (36.9 C) 98.4 F (36.9 C)  TempSrc: Oral Oral Oral Oral  SpO2: 94% 95% 91% 92%  Weight:      Height:       Physical Exam General: Resting comfortably in bed HEENT: NCAT CV: RRR, normal S1-S2 no murmurs rubs or gallops appreciated, 2+ edema in the ankles up the lower shins. PULM: Clear to auscultation bilaterally ABD: Soft, mild tenderness in all quadrants NEURO: Alert and oriented, no focal deficits noted  Assessment/Plan:  Principal Problem:   Partial intestinal obstruction  (HCC) Active Problems:   Ileus (HCC)   Constipation   Lung mass  In summary, Ms. Fidler is an 84 y.o female with a hx of chronic back pain on Tramadol, iron deficiency anemia, vitamin D deficiency, osteoporosis, gastritis and Barrett's esophagus who presented with a progressive increase of abdominal pain. Imaging findings consistent with partial bowel obstruction.   #Partial small bowel obstruction: CT abdomen on admission showed a dilated fluid filled small bowel and terminal ileum with significant stool burden in the ascending colon concerning for partial obstruction.   Patient is tolerating clear liquids at this time.  GI is invovled and we appreciate recommendations. Currently holding home tramadol. -Appreciate GI recommendations  -Concern for partial obstruction and ongoing iron deficiency may suggest malignancy.  Needs colonoscopy to rule out.  -MiraLAX BID   -Discuss with GI on next steps. Consider trying methylnaltrexone.  IV Dilaudid 0.5 mg q6hrs PRN. Will try to avoid more opiates. Pt has been using it consistently  -D/c Telemetry  #LE edema: Progressively developed over the last several days. Likely secondary to continuous IVF.  -D/c fluids today.  #Iron Deficiency Anemia: Patient takes oral iron at home, but in the setting of bowel obstruction may make constipation worse. This has been discontinued.  Hemoglobin stable. Iron studies demonstrate  low iron levels. S/p feraheme x1.  #Pulmonary Thickening: Incidental finding of pulmonary mass on chest x-ray. Concerning for malignancy but CT chest demonstrated pleural thickening, and no mass. -F/u CT in 3 months recommended   #Primary Hyperparathyroidism: PTH elevated for 207. Ionized calcium levels were unable to be obtained. Pt has a longstanding hx of vitamin d deficiency as well.  -Replete Ca PRN  #Insomnia -Ramelteon nightly PRN  #GERD/Gastritis/Barret's Esophagus #FEN/GI -Diet: Clear liquids -Fluids: None -IV Protonix 40  mg daily -Zofran 4 mg q6hrs PRN -Consult to dietitian, appreciate recs  #DVT prophylaxis -SCDs  #CODE STATUS: DNR  #Dispo:  Prior to Admission Living Arrangement: Home Anticipated Discharge Location: Home Barriers to Discharge: Ongoing medical workup  Earlene Plater, MD Internal Medicine, PGY1 Pager: [phone]  08/09/2019,9:20 AM

## 2019-07-31 NOTE — Progress Notes (Signed)
Pt. was able to drink all of bowel prep with some encouragement. Will continue to monitor.

## 2019-07-31 NOTE — Plan of Care (Signed)
  Problem: Health Behavior/Discharge Planning: Goal: Ability to manage health-related needs will improve Outcome: Progressing   Problem: Clinical Measurements: Goal: Ability to maintain clinical measurements within normal limits will improve Outcome: Progressing Goal: Will remain free from infection Outcome: Progressing Goal: Respiratory complications will improve Outcome: Progressing Goal: Cardiovascular complication will be avoided Outcome: Progressing   Problem: Activity: Goal: Risk for activity intolerance will decrease Outcome: Progressing   Problem: Coping: Goal: Level of anxiety will decrease Outcome: Progressing   Problem: Elimination: Goal: Will not experience complications related to urinary retention Outcome: Progressing   Problem: Pain Managment: Goal: General experience of comfort will improve Outcome: Progressing   Problem: Safety: Goal: Ability to remain free from injury will improve Outcome: Progressing   Problem: Skin Integrity: Goal: Risk for impaired skin integrity will decrease Outcome: Progressing

## 2019-07-31 NOTE — Anesthesia Procedure Notes (Signed)
Procedure Name: MAC Date/Time: 08/07/2019 2:07 PM Performed by: Kathryne Hitch, CRNA Pre-anesthesia Checklist: Emergency Drugs available, Suction available, Patient being monitored and Patient identified Patient Re-evaluated:Patient Re-evaluated prior to induction Oxygen Delivery Method: Nasal cannula Preoxygenation: Pre-oxygenation with 100% oxygen Induction Type: IV induction Dental Injury: Teeth and Oropharynx as per pre-operative assessment

## 2019-07-31 NOTE — Transfer of Care (Signed)
Immediate Anesthesia Transfer of Care Note  Patient: Allison Rojas  Procedure(s) Performed: COLONOSCOPY (N/A )  Patient Location: PACU  Anesthesia Type:MAC  Level of Consciousness: drowsy and patient cooperative  Airway & Oxygen Therapy: Patient Spontanous Breathing and Patient connected to nasal cannula oxygen  Post-op Assessment: Report given to RN and Post -op Vital signs reviewed and stable  Post vital signs: Reviewed and stable  Last Vitals:  Vitals Value Taken Time  BP 109/60 07/20/2019 1448  Temp    Pulse 97 08/03/2019 1448  Resp 20 08/13/2019 1448  SpO2 94 % 07/30/2019 1448    Last Pain:  Vitals:   07/30/2019 1448  TempSrc: (P) Axillary  PainSc:       Patients Stated Pain Goal: 3 (16/10/96 0454)  Complications: No apparent anesthesia complications

## 2019-07-31 NOTE — Op Note (Signed)
[facility] Patient Name: Allison Rojas Procedure Date : 08/09/2019 MRN: [id number] Attending MD: Justice Britain , MD Date of Birth: 23-Jan-1936 CSN: [id number] Age: 84 Admit Type: Inpatient Procedure:                Colonoscopy Indications:              Generalized abdominal pain, Abnormal CT of the GI                            tract, Change in bowel habits, Change in stool                            caliber, Constipation Providers:                Justice Britain, MD, Josie Dixon, RN, Lazaro Arms, Technician Referring MD:             Carlota Raspberry. Havery Moros, MD, Milus Banister, MD,                            Medical Service Medicines:                Monitored Anesthesia Care Complications:            No immediate complications. Estimated Blood Loss:     Estimated blood loss: none. Procedure:                Pre-Anesthesia Assessment:                           - Prior to the procedure, a History and Physical                            was performed, and patient medications and                            allergies were reviewed. The patient's tolerance of                            previous anesthesia was also reviewed. The risks                            and benefits of the procedure and the sedation                            options and risks were discussed with the patient.                            All questions were answered, and informed consent                            was obtained. Prior Anticoagulants: The patient has  taken no previous anticoagulant or antiplatelet                            agents. ASA Grade Assessment: III - A patient with                            severe systemic disease. After reviewing the risks                            and benefits, the patient was deemed in                            satisfactory condition to undergo the procedure.                           After  obtaining informed consent, the colonoscope                            was passed under direct vision. Throughout the                            procedure, the patient's blood pressure, pulse, and                            oxygen saturations were monitored continuously. The                            PCF-H190DL ([id number]) Olympus pediatric colonscope                            was introduced through the anus and advanced to the                            the splenic flexure. The GIF-H190 ([id number]) Olympus                            gastroscope was introduced through the and advanced                            to the. The colonoscopy was technically difficult                            and complex due to unsatisfactory bowel prep and                            poor endoscopic visualization. The patient                            tolerated the procedure. The quality of the bowel                            preparation was unsatisfactory. Scope In: 2:18:04 PM Scope Out: 2:40:14 PM Total Procedure Duration: 0 hours 22 minutes 10 seconds  Findings:  The digital rectal exam findings include hemorrhoids. Pertinent       negatives include no palpable rectal lesions.      Extensive amounts of solid stool was found in the proximal rectum, in       the recto-sigmoid colon, in the sigmoid colon and in the descending       colon, making visualization difficult. Lavage of the area was performed       using copious amounts, resulting in incomplete clearance with continued       poor visualization. I tried to break up the stool but it truely extends       from the      Normal mucosa was found in the descending colon and presumed distal       transverse but due to the previously noted redundancy in the sigmoid I       could have still be in that region. My entire adult endoscope was within       the patient and I had run out of scope since I could not reduce the       colon with it being full of  stool. Impression:               - Preparation of the colon was unsatisfactory.                            Lavage in effort of trying to break it up and wet                            the surface. Not successful at cleansing.                           - Hemorrhoids found on digital rectal exam.                           - Stool in the proximal rectum, in the                            recto-sigmoid colon, in the sigmoid colon and in                            the descending colon.                           - Normal mucosa in the descending colon presumed                            but in setting of redundant sigmoid colon it could                            be possible that I was still in that region. Recommendation:           - The patient will be observed post-procedure,                            until all discharge criteria are met.                           -  Return patient to hospital ward for ongoing care.                           - Clear liquid diet.                           - Highly recommend a SMOG v Soap-Suds Enema (I will                            discuss with patient and daughter via interpretor                            services).                           - Recommend further bowel preparation MoviPrep or                            GoLytely for next 24 hours. Can be drunk over the                            course of the day rather than in set time periods.                           - Dulcolax 10 mg by mouth today and tomorrow.                           - KUB to be obtained in the AM.                           - If KUB is not showing evidence of an obstruction,                            should be able to trial Methylnaltrexone tomorrow                            per primary medical service thoughts.                           - Minimize Opioids as able.                           - Will try to repeat Colonoscopy attempt on Friday                            if patient and  family still agreeable.                           - The findings and recommendations were discussed                            with the patient.                           -  The findings and recommendations were discussed                            with the patient's family.                           - The findings and recommendations were discussed                            with the referring physician. Procedure Code(s):        --- Professional ---                           [phone], Colonoscopy, flexible; diagnostic, including                            collection of specimen(s) by brushing or washing,                            when performed (separate procedure) Diagnosis Code(s):        --- Professional ---                           K64.9, Unspecified hemorrhoids                           R10.84, Generalized abdominal pain                           R19.4, Change in bowel habit                           R19.5, Other fecal abnormalities                           K59.00, Constipation, unspecified                           R93.3, Abnormal findings on diagnostic imaging of                            other parts of digestive tract CPT copyright 2019 American Medical Association. All rights reserved. The codes documented in this report are preliminary and upon coder review may  be revised to meet current compliance requirements. Justice Britain, MD 07/30/2019 2:59:49 PM Number of Addenda: 0

## 2019-08-01 ENCOUNTER — Inpatient Hospital Stay (HOSPITAL_COMMUNITY): Payer: Medicare Other

## 2019-08-01 ENCOUNTER — Encounter: Payer: Self-pay | Admitting: *Deleted

## 2019-08-01 DIAGNOSIS — K5909 Other constipation: Secondary | ICD-10-CM | POA: Diagnosis not present

## 2019-08-01 DIAGNOSIS — K56600 Partial intestinal obstruction, unspecified as to cause: Secondary | ICD-10-CM | POA: Diagnosis not present

## 2019-08-01 LAB — BASIC METABOLIC PANEL
Anion gap: 11 (ref 5–15)
BUN: 15 mg/dL (ref 8–23)
CO2: 20 mmol/L — ABNORMAL LOW (ref 22–32)
Calcium: 6.5 mg/dL — ABNORMAL LOW (ref 8.9–10.3)
Chloride: 104 mmol/L (ref 98–111)
Creatinine, Ser: 0.8 mg/dL (ref 0.44–1.00)
GFR calc Af Amer: >60 mL/min (ref >60)
GFR calc non Af Amer: >60 mL/min (ref >60)
Glucose, Bld: 133 mg/dL — ABNORMAL HIGH (ref 70–99)
Potassium: 4 mmol/L (ref 3.5–5.1)
Sodium: 135 mmol/L (ref 135–145)

## 2019-08-01 LAB — CBC
HCT: 24.2 % — ABNORMAL LOW (ref 36.0–46.0)
Hemoglobin: 7.9 g/dL — ABNORMAL LOW (ref 12.0–15.0)
MCH: 29.9 pg (ref 26.0–34.0)
MCHC: 32.6 g/dL (ref 30.0–36.0)
MCV: 91.7 fL (ref 80.0–100.0)
Platelets: 381 10*3/uL (ref 150–400)
RBC: 2.64 MIL/uL — ABNORMAL LOW (ref 3.87–5.11)
RDW: 13.8 % (ref 11.5–15.5)
WBC: 9.3 10*3/uL (ref 4.0–10.5)
nRBC: 0.2 % (ref 0.0–0.2)

## 2019-08-01 LAB — PHOSPHORUS: Phosphorus: 1.5 mg/dL — ABNORMAL LOW (ref 2.5–4.6)

## 2019-08-01 MED ORDER — CALCIUM GLUCONATE-NACL 2-0.675 GM/100ML-% IV SOLN
2.0000 g | Freq: Once | INTRAVENOUS | Status: AC
  Administered 2019-08-01: 10:00:00 2000 mg via INTRAVENOUS
  Filled 2019-08-01: qty 100

## 2019-08-01 MED ORDER — SORBITOL 70 % SOLN
960.0000 mL | TOPICAL_OIL | Freq: Once | ORAL | Status: AC
  Administered 2019-08-01: 960 mL via RECTAL
  Filled 2019-08-01: qty 473

## 2019-08-01 MED ORDER — METHYLNALTREXONE BROMIDE 12 MG/0.6ML ~~LOC~~ SOLN
0.0750 mg/kg | Freq: Once | SUBCUTANEOUS | Status: AC
  Administered 2019-08-01: 13:00:00 3 mg via SUBCUTANEOUS
  Filled 2019-08-01: qty 0.6

## 2019-08-01 NOTE — H&P (Signed)
GASTROENTEROLOGY PROCEDURE H&P NOTE   Primary Care Physician: Mosetta Anis, MD  HPI: Allison Rojas is a 84 y.o. female who presents for Colonoscopy for evaluation of partial large bowel obstruction and abnormal CT imaging.  Past Medical History:  Diagnosis Date  . Allergy   . Chronic low back pain   . Compression fracture    T9 and T12 noted 05/2012 imaging. Followed by pain clinic   . Fall    Jan or Feb 2014   . Gastritis   . GERD (gastroesophageal reflux disease)   . Insomnia   . Irregular heart rate   . Osteoporosis   . Peptic ulcer disease   . Rib pain   . Right wrist fracture    3 - 4 monthes ago   Past Surgical History:  Procedure Laterality Date  . NO PAST SURGERIES     Current Facility-Administered Medications  Medication Dose Route Frequency Provider Last Rate Last Admin  . bisacodyl (DULCOLAX) EC tablet 10 mg  10 mg Oral Daily Mosetta Anis, MD      . dextrose 5 %-0.45 % sodium chloride infusion   Intravenous Continuous Al Decant, MD 75 mL/hr at 08/01/19 0057 New Bag at 08/01/19 0057  . feeding supplement (BOOST / RESOURCE BREEZE) liquid 1 Container  1 Container Oral BID BM Esterwood, Amy S, PA-C   1 Container at 07/28/19 1316  . HYDROmorphone (DILAUDID) injection 0.5 mg  0.5 mg Intravenous Q6H PRN Jean Rosenthal, MD   0.5 mg at 07/21/2019 0458  . multivitamin with minerals tablet 1 tablet  1 tablet Oral Daily Velna Ochs, MD      . ondansetron [facility]) injection 4 mg  4 mg Intravenous Q6H PRN Agyei, Obed K, MD      . pantoprazole (PROTONIX) injection 40 mg  40 mg Intravenous Q24H Jean Rosenthal, MD   40 mg at 08/07/2019 2214  . polyethylene glycol (GoLYTELY) solution 4,000 mL  4,000 mL Oral Once Mosetta Anis, MD      . polyethylene glycol (MIRALAX / GLYCOLAX) packet 17 g  17 g Oral BID Esterwood, Amy S, PA-C   17 g at 07/30/19 2200  . ramelteon (ROZEREM) tablet 8 mg  8 mg Oral QHS Mosetta Anis, MD   8 mg at 07/30/19 2200   Allergies  Allergen Reactions    . Amitriptyline Other (See Comments)    Patient is still taking this in 2021, since 02/2019 (??) Family is uncertain about this entry (??)   Family History  Problem Relation Age of Onset  . Colon cancer Neg Hx   . Esophageal cancer Neg Hx   . Rectal cancer Neg Hx   . Stomach cancer Neg Hx    Social History   Socioeconomic History  . Marital status: Widowed    Spouse name: Not on file  . Number of children: 1  . Years of education: Not on file  . Highest education level: Not on file  Occupational History  . Not on file  Tobacco Use  . Smoking status: Never Smoker  . Smokeless tobacco: Never Used  Substance and Sexual Activity  . Alcohol use: No  . Drug use: No  . Sexual activity: Never  Other Topics Concern  . Not on file  Social History Narrative   Financial assistance approved for 100% discount at [facility] and has [facility] card per Bonna Gains   11/30/2009   She is widowed, she has 2 children (only admits  to 1 daughter now 07/2012), she does not smoke cigarettes or drink alcohol   Lives with daughter Quaneisha Hanisch [phone]               Social Determinants of Health   Financial Resource Strain:   . Difficulty of Paying Living Expenses:   Food Insecurity:   . Worried About Programme researcher, broadcasting/film/video in the Last Year:   . Barista in the Last Year:   Transportation Needs:   . Freight forwarder (Medical):   Marland Kitchen Lack of Transportation (Non-Medical):   Physical Activity:   . Days of Exercise per Week:   . Minutes of Exercise per Session:   Stress:   . Feeling of Stress :   Social Connections:   . Frequency of Communication with Friends and Family:   . Frequency of Social Gatherings with Friends and Family:   . Attends Religious Services:   . Active Member of Clubs or Organizations:   . Attends Banker Meetings:   Marland Kitchen Marital Status:   Intimate Partner Violence:   . Fear of Current or Ex-Partner:   . Emotionally Abused:   Marland Kitchen Physically Abused:   .  Sexually Abused:     Physical Exam: Vital signs in last 24 hours: Temp:  [98.1 F (36.7 C)-99.3 F (37.4 C)] 99.3 F (37.4 C) (04/14 2241) Pulse Rate:  [97-104] 102 (04/14 2241) Resp:  [17-22] 17 (04/14 2241) BP: (109-148)/(60-82) 124/82 (04/14 2241) SpO2:  [92 %-96 %] 96 % (04/14 2241) Weight:  [40.4 kg] 40.4 kg (04/14 1324) Last BM Date: 07/29/19 GEN: NAD EYE: Sclerae anicteric ENT: MMM CV: Non-tachycardic GI: Harder abdomen to palpation 7/10 pain currently NEURO:  Alert & Oriented x 3  Lab Results: Recent Labs    07/30/19 0237 08-15-2019 0354  WBC 7.8 7.7  HGB 8.6* 8.2*  HCT 27.2* 25.0*  PLT 317 341   BMET Recent Labs    07/30/19 0237 2019-08-15 0354  NA 131* 134*  K 4.5 4.0  CL 103 102  CO2 13* 20*  GLUCOSE 83 120*  BUN 22 17  CREATININE 0.92 0.77  CALCIUM 7.0* 7.0*   LFT No results for input(s): PROT, ALBUMIN, AST, ALT, ALKPHOS, BILITOT, BILIDIR, IBILI in the last 72 hours. PT/INR No results for input(s): LABPROT, INR in the last 72 hours.   Impression / Plan: This is a 84 y.o.female who presents for Colonoscopy for evaluation of partial large bowel obstruction, weight loss, IDA, abnormal CT imaging.  Long 25 minute conversation with the patient and patient's daughter via interpretor services.  Higher risk procedure but hopeful that she is clean enough for [place] to see how things are though did not tolerate this morning's preparation.  The risks and benefits of endoscopic evaluation were discussed with the patient; these include but are not limited to the risk of perforation, infection, bleeding, missed lesions, lack of diagnosis, severe illness requiring hospitalization, as well as anesthesia and sedation related illnesses.  The patient and daughter are agreeable to proceed.    Corliss Parish, MD [facility]oscopy Office # [phone]

## 2019-08-01 NOTE — Plan of Care (Signed)
  Problem: Education: Goal: Knowledge of General Education information will improve Description: Including pain rating scale, medication(s)/side effects and non-pharmacologic comfort measures Outcome: Progressing   Problem: Health Behavior/Discharge Planning: Goal: Ability to manage health-related needs will improve Outcome: Progressing   Problem: Clinical Measurements: Goal: Ability to maintain clinical measurements within normal limits will improve Outcome: Progressing Goal: Will remain free from infection Outcome: Progressing Goal: Diagnostic test results will improve Outcome: Progressing   Problem: Coping: Goal: Level of anxiety will decrease Outcome: Progressing   Problem: Pain Managment: Goal: General experience of comfort will improve Outcome: Progressing   Problem: Safety: Goal: Ability to remain free from injury will improve Outcome: Progressing   Problem: Skin Integrity: Goal: Risk for impaired skin integrity will decrease Outcome: Progressing   

## 2019-08-01 NOTE — Progress Notes (Addendum)
Pt and daughter declining enema at this time, would like to wait another hour. RN will reassess. Pt has received dose of miralax, relistor, dulcolax, and approximately 1/3-1/2 of GoLYTELY. 1 small hard BM after doses. Education provided on importance of enema to prevent further serious complications. Pt and daughter verbalized understanding.   1430 - Pt declining enema. Wants to wait 30 minutes.   1515 Pt continues to decline enema and wanting to do enema around 1630.

## 2019-08-01 NOTE — Progress Notes (Signed)
Subjective:   Pt was seen at the bedside today.  Patient states that she had 1 very small bowel movement in the last 24 hours.  We discussed how the colonoscopy was performed yesterday and there was a very large amount of stool.  We also discussed how the GI doctors would like to do more GI prep today, with enemas in order to do a repeat colonoscopy tomorrow.  Patient voiced hesitancy, stating that she did not want to do enemas.  We explained to the patient that if we allow her obstruction to get worse, it could result in perforation of the bowel and that we recommended to use all modalities to try to get her to have a bowel movement.  Patient reluctantly agreed to try enemas in addition to the bowel prep.  Of note, the patient and her daughter were inquiring about IV nutrition, like TPN.  We discussed how this is the last line modality of nutrition, and would prefer to get her to have bowel movement so that she can take nutrition p.o.  Interpreter services used.  Objective: CBC Latest Ref Rng & Units 08/01/2019 08/09/2019 07/30/2019  WBC 4.0 - 10.5 K/uL 9.3 7.7 7.8  Hemoglobin 12.0 - 15.0 g/dL 7.9(L) 8.2(L) 8.6(L)  Hematocrit 36.0 - 46.0 % 24.2(L) 25.0(L) 27.2(L)  Platelets 150 - 400 K/uL 381 341 317   BMP Latest Ref Rng & Units 08/01/2019 08/05/2019 07/30/2019  Glucose 70 - 99 mg/dL 263(Z) 858(I) 83  BUN 8 - 23 mg/dL 15 17 22   Creatinine 0.44 - 1.00 mg/dL 5.02 7.74  BUN/Creat Ratio 12 - 28 - - -  Sodium 135 - 145 mmol/L 135 134(L) 131(L)  Potassium 3.5 - 5.1 mmol/L 4.0 4.0 4.5  Chloride 98 - 111 mmol/L 104 102 103  CO2 22 - 32 mmol/L 20(L) 20(L) 13(L)  Calcium 8.9 - 10.3 mg/dL 1.28) 7.0(L) 7.0(L)   Vital signs in last 24 hours: Vitals:   08/05/2019 2241 08/01/19 0454 08/01/19 0900 08/01/19 1015  BP: 124/82 128/63  132/62  Pulse: (!) 102 92  99  Resp: 17 18 16 18   Temp: 99.3 F (37.4 C) 98.4 F (36.9 C)  98.6 F (37 C)  TempSrc: Oral Oral  Oral  SpO2: 96% 93%  92%  Weight:       Height:       Physical Exam General: Resting in bed HEENT: NCAT CV: RRR, no MRG, 2+ edema in ankles PULM: Clear in all lung fields bilaterally ABD: Soft, mildly tender in all quadrants NEURO: Alert and oriented, nod  Assessment/Plan:  Principal Problem:   Partial intestinal obstruction (HCC) Active Problems:   Ileus (HCC)   Constipation   Lung mass  In summary, Ms. Binford is an 84 y.o female with a hx of chronic back pain on Tramadol, iron deficiency anemia, vitamin D deficiency, osteoporosis, gastritis and Barrett's esophagus who presented with a progressive increase of abdominal pain. Imaging findings consistent with partial bowel obstruction.   #Partial small bowel obstruction: CT abdomen on admission showed a dilated fluid filled small bowel and terminal ileum with significant stool burden in the ascending colon concerning for partial obstruction.   Patient is tolerating clear liquids at this time.  GI is invovled and we appreciate recommendations. Currently holding home tramadol. -Appreciate GI recommendations  -Concern for partial obstruction and ongoing iron deficiency may suggest malignancy.  Colonoscopy on 4/14 demonstrated large stool burden. Plan to repeat prep today with enemas today, then colonoscopy 4/16.  -Discuss  TPN with GI per patient request -KUB showed large stool burden with distention of colon decrease from prior imaging. Dosing methylnaltrexone today   IV Dilaudid 0.5 mg q6hrs PRN. Will try to avoid more opiates. Pt has been using it consistently   #LE edema: Progressively developed over the last several days. Likely secondary to continuous IVF.  -D/c fluids today.  #Iron Deficiency Anemia: Patient takes oral iron at home, but in the setting of bowel obstruction may make constipation worse. This has been discontinued.  Hemoglobin stable. Iron studies demonstrate low iron levels. S/p feraheme x1.  #Pulmonary Thickening: Incidental finding of pulmonary mass on  chest x-ray. Concerning for malignancy but CT chest demonstrated pleural thickening, and no mass. -F/u CT in 3 months recommended   #Primary Hyperparathyroidism: PTH elevated for 207. Ionized calcium levels were unable to be obtained. Pt has a longstanding hx of vitamin d deficiency as well.  -Replete Ca PRN  #Insomnia -Ramelteon nightly PRN  #GERD/Gastritis/Barret's Esophagus #FEN/GI -Diet: Clear liquids -Fluids: None -IV Protonix 40 mg daily -Zofran 4 mg q6hrs PRN -Consult to dietitian, appreciate recs  #DVT prophylaxis -SCDs  #CODE STATUS: DNR  #Dispo:  Prior to Admission Living Arrangement: Home Anticipated Discharge Location: Home Barriers to Discharge: Ongoing medical workup  Earlene Plater, MD Internal Medicine, PGY1 Pager: [phone]  08/01/2019,11:56 AM

## 2019-08-01 NOTE — H&P (View-Only) (Signed)
Daily Rounding Note  08/01/2019, 1:07 PM  LOS: 6 days   SUBJECTIVE:   Chief complaint: Abdominal pain.  Ileus versus partial obstruction. It is hard to get a clear answer from the patient using the daughter as the translator but it sounds like the patient has 1 or 2 bowel movements a week at best. Still uncomfortable. Had one hard stool mid-to-late morning.  She has had about a third of the jug of GoLYTELY, [place] store, oral Dulcolax, single dose MiraLAX.  Patient said she was not ready when nurse wanted to give her the smog enema.  OBJECTIVE:         Vital signs in last 24 hours:    Temp:  [98.1 F (36.7 C)-99.3 F (37.4 C)] 98.6 F (37 C) (04/15 1015) Pulse Rate:  [92-102] 99 (04/15 1015) Resp:  [16-22] 18 (04/15 1015) BP: (109-148)/(60-82) 132/62 (04/15 1015) SpO2:  [92 %-96 %] 92 % (04/15 1015) Weight:  [40.4 kg] 40.4 kg (04/14 1324) Last BM Date: 08/01/19 Filed Weights   07/27/19 0617 07/24/2019 1324  Weight: 40.4 kg 40.4 kg   General: Frail, pale, looks unwell.  Slightly uncomfortable. Heart: RRR. Chest: No labored breathing or cough Abdomen: Thin, soft, diffuse mildly tender.  No guarding or rebound. Extremities: Thin, no edema. Neuro/Psych: Flat affect, fluid speech when speaking with her daughter.  No tremors, no gross deficits.  Intake/Output from previous day: 04/14 0701 - 04/15 0700 In: 700 [I.V.:700] Out: -   Intake/Output this shift: Total I/O In: 1250.3 [P.O.:360; I.V.:790.3; IV Piggyback:100] Out: -   Lab Results: Recent Labs    07/30/19 0237 08/11/2019 0354 08/01/19 0355  WBC 7.8 7.7 9.3  HGB 8.6* 8.2* 7.9*  HCT 27.2* 25.0* 24.2*  PLT 317 341 381   BMET Recent Labs    07/30/19 0237 08/01/2019 0354 08/01/19 0355  NA 131* 134* 135  K 4.5 4.0 4.0  CL 103 102 104  CO2 13* 20* 20*  GLUCOSE 83 120* 133*  BUN 22 17 15   CREATININE 0.92 0.77 0.80  CALCIUM 7.0* 7.0* 6.5*   LFT No  results for input(s): PROT, ALBUMIN, AST, ALT, ALKPHOS, BILITOT, BILIDIR, IBILI in the last 72 hours. PT/INR No results for input(s): LABPROT, INR in the last 72 hours. Hepatitis Panel No results for input(s): HEPBSAG, HCVAB, HEPAIGM, HEPBIGM in the last 72 hours.  Studies/Results: DG Abd 2 Views  Result Date: 08/01/2019 CLINICAL DATA:  Abdominal pain and vomiting last night, history of partial colonic obstruction EXAM: ABDOMEN - 2 VIEW COMPARISON:  07/30/2019 FINDINGS: Mild scattered gaseous distention of colon, slightly decreased from prior exam. No small bowel dilatation or bowel wall thickening. No free air. Bones demineralized with degenerative changes and scoliosis of lumbar spine. Small LEFT pleural effusion and bibasilar atelectasis. IMPRESSION: Mild scattered gaseous distention of colon, decreased from prior study. Electronically Signed   By: 08/01/2019 M.D.   On: 08/01/2019 08:16    Scheduled Meds: . bisacodyl  10 mg Oral Daily  . feeding supplement  1 Container Oral BID BM  . methylnaltrexone  0.075 mg/kg Subcutaneous Once  . multivitamin with minerals  1 tablet Oral Daily  . pantoprazole (PROTONIX) IV  40 mg Intravenous Q24H  . polyethylene glycol  17 g Oral BID  . ramelteon  8 mg Oral QHS  . sorbitol, milk of mag, mineral oil, glycerin (SMOG) enema  960 mL Rectal Once   Continuous Infusions: . dextrose 5 %  and 0.45% NaCl 75 mL/hr at 08/01/19 0057   PRN Meds:.HYDROmorphone (DILAUDID) injection, ondansetron (ZOFRAN) IV   ASSESMENT:   *   Bowel obstruction.  Several months abd pain.  No BM x 8 d PTA.   CT w partial obstruction vs ileus.    Home meds include Ferosul, elavil which can lead to constipation.   4/14 colonoscopy.  Poor prep, stool from descending colon thru rectum. Non-bleeding hemorrhoids.  As seen, mucosa in desc colon normal vs within redundant sigmoid colon.   Repeat slow bowel prep in process as well as Dulcolax, Relistor.  .   *   HH on EGD of 07/22/19.       *    Normocytic anemia.  Low iron and low TIBC per 07/28/19 labs.   Feraheme infusion on 4/12.   PLAN   *   Repeat attempt colonoscopy on 4/16 set for 0845.      Azucena Freed  08/01/2019, 1:07 PM Phone [phone]

## 2019-08-01 NOTE — Progress Notes (Signed)
Daily Rounding Note  08/01/2019, 1:07 PM  LOS: 6 days   SUBJECTIVE:   Chief complaint: Abdominal pain.  Ileus versus partial obstruction. It is hard to get a clear answer from the patient using the daughter as the translator but it sounds like the patient has 1 or 2 bowel movements a week at best. Still uncomfortable. Had one hard stool mid-to-late morning.  She has had about a third of the jug of GoLYTELY, [place] store, oral Dulcolax, single dose MiraLAX.  Patient said she was not ready when nurse wanted to give her the smog enema.  OBJECTIVE:         Vital signs in last 24 hours:    Temp:  [98.1 F (36.7 C)-99.3 F (37.4 C)] 98.6 F (37 C) (04/15 1015) Pulse Rate:  [92-102] 99 (04/15 1015) Resp:  [16-22] 18 (04/15 1015) BP: (109-148)/(60-82) 132/62 (04/15 1015) SpO2:  [92 %-96 %] 92 % (04/15 1015) Weight:  [40.4 kg] 40.4 kg (04/14 1324) Last BM Date: 08/01/19 Filed Weights   07/27/19 0617 07/24/2019 1324  Weight: 40.4 kg 40.4 kg   General: Frail, pale, looks unwell.  Slightly uncomfortable. Heart: RRR. Chest: No labored breathing or cough Abdomen: Thin, soft, diffuse mildly tender.  No guarding or rebound. Extremities: Thin, no edema. Neuro/Psych: Flat affect, fluid speech when speaking with her daughter.  No tremors, no gross deficits.  Intake/Output from previous day: 04/14 0701 - 04/15 0700 In: 700 [I.V.:700] Out: -   Intake/Output this shift: Total I/O In: 1250.3 [P.O.:360; I.V.:790.3; IV Piggyback:100] Out: -   Lab Results: Recent Labs    07/30/19 0237 08/11/2019 0354 08/01/19 0355  WBC 7.8 7.7 9.3  HGB 8.6* 8.2* 7.9*  HCT 27.2* 25.0* 24.2*  PLT 317 341 381   BMET Recent Labs    07/30/19 0237 08/01/2019 0354 08/01/19 0355  NA 131* 134* 135  K 4.5 4.0 4.0  CL 103 102 104  CO2 13* 20* 20*  GLUCOSE 83 120* 133*  BUN 22 17 15   CREATININE 0.92 0.77 0.80  CALCIUM 7.0* 7.0* 6.5*   LFT No  results for input(s): PROT, ALBUMIN, AST, ALT, ALKPHOS, BILITOT, BILIDIR, IBILI in the last 72 hours. PT/INR No results for input(s): LABPROT, INR in the last 72 hours. Hepatitis Panel No results for input(s): HEPBSAG, HCVAB, HEPAIGM, HEPBIGM in the last 72 hours.  Studies/Results: DG Abd 2 Views  Result Date: 08/01/2019 CLINICAL DATA:  Abdominal pain and vomiting last night, history of partial colonic obstruction EXAM: ABDOMEN - 2 VIEW COMPARISON:  07/30/2019 FINDINGS: Mild scattered gaseous distention of colon, slightly decreased from prior exam. No small bowel dilatation or bowel wall thickening. No free air. Bones demineralized with degenerative changes and scoliosis of lumbar spine. Small LEFT pleural effusion and bibasilar atelectasis. IMPRESSION: Mild scattered gaseous distention of colon, decreased from prior study. Electronically Signed   By: 08/01/2019 M.D.   On: 08/01/2019 08:16    Scheduled Meds: . bisacodyl  10 mg Oral Daily  . feeding supplement  1 Container Oral BID BM  . methylnaltrexone  0.075 mg/kg Subcutaneous Once  . multivitamin with minerals  1 tablet Oral Daily  . pantoprazole (PROTONIX) IV  40 mg Intravenous Q24H  . polyethylene glycol  17 g Oral BID  . ramelteon  8 mg Oral QHS  . sorbitol, milk of mag, mineral oil, glycerin (SMOG) enema  960 mL Rectal Once   Continuous Infusions: . dextrose 5 %  and 0.45% NaCl 75 mL/hr at 08/01/19 0057   PRN Meds:.HYDROmorphone (DILAUDID) injection, ondansetron (ZOFRAN) IV   ASSESMENT:   *   Bowel obstruction.  Several months abd pain.  No BM x 8 d PTA.   CT w partial obstruction vs ileus.    Home meds include Ferosul, elavil which can lead to constipation.   4/14 colonoscopy.  Poor prep, stool from descending colon thru rectum. Non-bleeding hemorrhoids.  As seen, mucosa in desc colon normal vs within redundant sigmoid colon.   Repeat slow bowel prep in process as well as Dulcolax, Relistor.  .   *   HH on EGD of 07/22/19.       *    Normocytic anemia.  Low iron and low TIBC per 07/28/19 labs.   Feraheme infusion on 4/12.   PLAN   *   Repeat attempt colonoscopy on 4/16 set for 0845.      Allison Rojas  08/01/2019, 1:07 PM Phone [phone] 

## 2019-08-02 ENCOUNTER — Inpatient Hospital Stay (HOSPITAL_COMMUNITY): Payer: Medicare Other | Admitting: Anesthesiology

## 2019-08-02 ENCOUNTER — Inpatient Hospital Stay (HOSPITAL_COMMUNITY): Payer: Medicare Other

## 2019-08-02 ENCOUNTER — Encounter (HOSPITAL_COMMUNITY): Payer: Self-pay | Admitting: Internal Medicine

## 2019-08-02 ENCOUNTER — Encounter (HOSPITAL_COMMUNITY): Admission: EM | Disposition: E | Payer: Self-pay | Source: Home / Self Care | Attending: Internal Medicine

## 2019-08-02 DIAGNOSIS — J9601 Acute respiratory failure with hypoxia: Secondary | ICD-10-CM

## 2019-08-02 DIAGNOSIS — T17908A Unspecified foreign body in respiratory tract, part unspecified causing other injury, initial encounter: Secondary | ICD-10-CM

## 2019-08-02 DIAGNOSIS — K219 Gastro-esophageal reflux disease without esophagitis: Secondary | ICD-10-CM

## 2019-08-02 DIAGNOSIS — K5939 Other megacolon: Secondary | ICD-10-CM

## 2019-08-02 DIAGNOSIS — K6289 Other specified diseases of anus and rectum: Secondary | ICD-10-CM

## 2019-08-02 DIAGNOSIS — K2970 Gastritis, unspecified, without bleeding: Secondary | ICD-10-CM

## 2019-08-02 HISTORY — PX: COLONOSCOPY: SHX5424

## 2019-08-02 HISTORY — PX: BOWEL DECOMPRESSION: SHX5532

## 2019-08-02 LAB — CBC
HCT: 24.5 % — ABNORMAL LOW (ref 36.0–46.0)
Hemoglobin: 8.3 g/dL — ABNORMAL LOW (ref 12.0–15.0)
MCH: 31 pg (ref 26.0–34.0)
MCHC: 33.9 g/dL (ref 30.0–36.0)
MCV: 91.4 fL (ref 80.0–100.0)
Platelets: 381 10*3/uL (ref 150–400)
RBC: 2.68 MIL/uL — ABNORMAL LOW (ref 3.87–5.11)
RDW: 13.8 % (ref 11.5–15.5)
WBC: 8.5 10*3/uL (ref 4.0–10.5)
nRBC: 0 % (ref 0.0–0.2)

## 2019-08-02 LAB — BASIC METABOLIC PANEL
Anion gap: 9 (ref 5–15)
BUN: 15 mg/dL (ref 8–23)
CO2: 21 mmol/L — ABNORMAL LOW (ref 22–32)
Calcium: 7.1 mg/dL — ABNORMAL LOW (ref 8.9–10.3)
Chloride: 101 mmol/L (ref 98–111)
Creatinine, Ser: 0.63 mg/dL (ref 0.44–1.00)
GFR calc Af Amer: >60 mL/min (ref >60)
GFR calc non Af Amer: >60 mL/min (ref >60)
Glucose, Bld: 171 mg/dL — ABNORMAL HIGH (ref 70–99)
Potassium: 3.8 mmol/L (ref 3.5–5.1)
Sodium: 131 mmol/L — ABNORMAL LOW (ref 135–145)

## 2019-08-02 LAB — HEPATIC FUNCTION PANEL
ALT: 17 U/L (ref 0–44)
AST: 20 U/L (ref 15–41)
Albumin: 2 g/dL — ABNORMAL LOW (ref 3.5–5.0)
Alkaline Phosphatase: 52 U/L (ref 38–126)
Bilirubin, Direct: 0.1 mg/dL (ref 0.0–0.2)
Indirect Bilirubin: 0.3 mg/dL (ref 0.3–0.9)
Total Bilirubin: 0.4 mg/dL (ref 0.3–1.2)
Total Protein: 4.4 g/dL — ABNORMAL LOW (ref 6.5–8.1)

## 2019-08-02 LAB — PHOSPHORUS: Phosphorus: 1.3 mg/dL — ABNORMAL LOW (ref 2.5–4.6)

## 2019-08-02 SURGERY — COLONOSCOPY
Anesthesia: Monitor Anesthesia Care

## 2019-08-02 MED ORDER — ONDANSETRON 4 MG PO TBDP: 4.0000 mg | ORAL_TABLET | Freq: Four times a day (QID) | ORAL | Status: DC | PRN

## 2019-08-02 MED ORDER — ONDANSETRON HCL 4 MG/2ML IJ SOLN: 4.0000 mg | Freq: Four times a day (QID) | INTRAMUSCULAR | Status: DC | PRN

## 2019-08-02 MED ORDER — ONDANSETRON HCL 4 MG/2ML IJ SOLN
INTRAMUSCULAR | Status: DC | PRN
  Administered 2019-08-02: 4 mg via INTRAVENOUS

## 2019-08-02 MED ORDER — GLYCOPYRROLATE 0.2 MG/ML IJ SOLN: 0.2000 mg | INTRAMUSCULAR | Status: DC | PRN

## 2019-08-02 MED ORDER — SODIUM CHLORIDE 0.9 % IV SOLN
1.5000 g | Freq: Once | INTRAVENOUS | Status: AC
  Administered 2019-08-02: 14:00:00 1.5 g via INTRAVENOUS
  Filled 2019-08-02: qty 1.5

## 2019-08-02 MED ORDER — FENTANYL CITRATE (PF) 100 MCG/2ML IJ SOLN
INTRAMUSCULAR | Status: AC
  Filled 2019-08-02: qty 2

## 2019-08-02 MED ORDER — SODIUM CHLORIDE 0.9 % IV SOLN: 8.0000 mg | Freq: Once | INTRAVENOUS | Status: DC

## 2019-08-02 MED ORDER — GLYCOPYRROLATE 0.2 MG/ML IJ SOLN
0.2000 mg | INTRAMUSCULAR | Status: DC | PRN
  Administered 2019-08-02: 0.2 mg via INTRAVENOUS
  Filled 2019-08-02: qty 1

## 2019-08-02 MED ORDER — CALCIUM GLUCONATE-NACL 2-0.675 GM/100ML-% IV SOLN
2.0000 g | Freq: Once | INTRAVENOUS | Status: AC
  Administered 2019-08-02: 2000 mg via INTRAVENOUS
  Filled 2019-08-02: qty 100

## 2019-08-02 MED ORDER — PROPOFOL 500 MG/50ML IV EMUL
INTRAVENOUS | Status: DC | PRN
  Administered 2019-08-02: 75 ug/kg/min via INTRAVENOUS

## 2019-08-02 MED ORDER — MORPHINE 100MG IN NS 100ML (1MG/ML) PREMIX INFUSION: 1.0000 mg/h | INTRAVENOUS | Status: DC

## 2019-08-02 MED ORDER — ONDANSETRON HCL 4 MG/2ML IJ SOLN
8.0000 mg | Freq: Once | INTRAMUSCULAR | Status: AC
  Administered 2019-08-02: 16:00:00 8 mg via INTRAVENOUS

## 2019-08-02 MED ORDER — ACETAMINOPHEN 325 MG PO TABS: 650.0000 mg | ORAL_TABLET | Freq: Four times a day (QID) | ORAL | Status: DC | PRN

## 2019-08-02 MED ORDER — POLYVINYL ALCOHOL 1.4 % OP SOLN
1.0000 [drp] | Freq: Four times a day (QID) | OPHTHALMIC | Status: DC | PRN
  Filled 2019-08-02: qty 15

## 2019-08-02 MED ORDER — IOHEXOL 300 MG/ML  SOLN
100.0000 mL | Freq: Once | INTRAMUSCULAR | Status: AC | PRN
  Administered 2019-08-02: 100 mL via INTRAVENOUS

## 2019-08-02 MED ORDER — FENTANYL CITRATE (PF) 100 MCG/2ML IJ SOLN
100.0000 ug | Freq: Once | INTRAMUSCULAR | Status: AC
  Administered 2019-08-02: 100 ug via INTRAVENOUS

## 2019-08-02 MED ORDER — GLYCOPYRROLATE 1 MG PO TABS
1.0000 mg | ORAL_TABLET | ORAL | Status: DC | PRN
  Filled 2019-08-02: qty 1

## 2019-08-02 MED ORDER — SODIUM CHLORIDE 0.9 % IV SOLN: INTRAVENOUS | Status: DC

## 2019-08-02 MED ORDER — BIOTENE DRY MOUTH MT LIQD: 15.0000 mL | OROMUCOSAL | Status: DC | PRN

## 2019-08-02 MED ORDER — LIDOCAINE 2% (20 MG/ML) 5 ML SYRINGE
INTRAMUSCULAR | Status: DC | PRN
  Administered 2019-08-02: 40 mg via INTRAVENOUS

## 2019-08-02 MED ORDER — MORPHINE 100MG IN NS 100ML (1MG/ML) PREMIX INFUSION
1.0000 mg/h | INTRAVENOUS | Status: DC
  Administered 2019-08-02: 1 mg/h via INTRAVENOUS
  Administered 2019-08-02: 4 mg/h via INTRAVENOUS
  Administered 2019-08-02: 18:00:00 2 mg/h via INTRAVENOUS
  Administered 2019-08-02: 18:00:00 3 mg/h via INTRAVENOUS
  Administered 2019-08-03: 4 mg/h via INTRAVENOUS
  Filled 2019-08-02 (×2): qty 100

## 2019-08-02 MED ORDER — FENTANYL CITRATE (PF) 100 MCG/2ML IJ SOLN
12.5000 ug | Freq: Once | INTRAMUSCULAR | Status: AC
  Administered 2019-08-02: 12.5 ug via INTRAVENOUS

## 2019-08-02 MED ORDER — MORPHINE SULFATE (PF) 2 MG/ML IV SOLN: 1.0000 mg | INTRAVENOUS | Status: DC | PRN

## 2019-08-02 MED ORDER — ALBUTEROL SULFATE (2.5 MG/3ML) 0.083% IN NEBU
INHALATION_SOLUTION | RESPIRATORY_TRACT | Status: AC
  Filled 2019-08-02: qty 3

## 2019-08-02 MED ORDER — ONDANSETRON HCL 4 MG/2ML IJ SOLN
INTRAMUSCULAR | Status: AC
  Filled 2019-08-02: qty 4

## 2019-08-02 MED ORDER — ALBUTEROL SULFATE (2.5 MG/3ML) 0.083% IN NEBU: 2.5000 mg | INHALATION_SOLUTION | RESPIRATORY_TRACT | Status: DC | PRN

## 2019-08-02 MED ORDER — MORPHINE SULFATE (PF) 2 MG/ML IV SOLN
INTRAVENOUS | Status: AC
  Administered 2019-08-02: 1 mg via INTRAVENOUS
  Filled 2019-08-02: qty 1

## 2019-08-02 MED ORDER — SODIUM PHOSPHATES 45 MMOLE/15ML IV SOLN
30.0000 mmol | Freq: Once | INTRAVENOUS | Status: DC
  Filled 2019-08-02: qty 10

## 2019-08-02 MED ORDER — LACTATED RINGERS IV SOLN: INTRAVENOUS | Status: DC

## 2019-08-02 MED ORDER — DIPHENHYDRAMINE HCL 50 MG/ML IJ SOLN: 25.0000 mg | INTRAMUSCULAR | Status: DC | PRN

## 2019-08-02 MED ORDER — IPRATROPIUM-ALBUTEROL 0.5-2.5 (3) MG/3ML IN SOLN: 3.0000 mL | Freq: Four times a day (QID) | RESPIRATORY_TRACT | Status: DC

## 2019-08-02 MED ORDER — ACETAMINOPHEN 650 MG RE SUPP: 650.0000 mg | Freq: Four times a day (QID) | RECTAL | Status: DC | PRN

## 2019-08-02 MED ORDER — LORAZEPAM 2 MG/ML IJ SOLN
2.0000 mg | INTRAMUSCULAR | Status: DC | PRN
  Administered 2019-08-02 (×2): 2 mg via INTRAVENOUS
  Filled 2019-08-02 (×2): qty 1

## 2019-08-02 MED ORDER — ALBUTEROL SULFATE (2.5 MG/3ML) 0.083% IN NEBU
2.5000 mg | INHALATION_SOLUTION | Freq: Once | RESPIRATORY_TRACT | Status: AC
  Administered 2019-08-02: 2.5 mg via RESPIRATORY_TRACT

## 2019-08-02 NOTE — Progress Notes (Signed)
PHARMACY - TOTAL PARENTERAL NUTRITION CONSULT NOTE   Indication: Bowel obstruction  Patient Measurements: Height: 5' (152.4 cm) Weight: 40.4 kg (89 lb 1.1 oz) IBW/kg (Calculated) : 45.5 TPN AdjBW (KG): 40.4 Body mass index is 17.39 kg/m.  Assessment:  84 year old female admitted 4/9 with ileus versus partial large bowel obstruction and has not eaten in 2 weeks. Pharmacy consulted to TPN.   Plan:  TPN ordered 4/16 after Hospital cut-off for compounding has passed and central access not yet obtained.  TPN labs entered - TPN will start 4/17 when access is in place.   Link Snuffer, PharmD, BCPS, BCCCP Clinical Pharmacist Please refer to [facility] for [facility] Pharmacy numbers 07/22/2019,3:16 PM

## 2019-08-02 NOTE — Significant Event (Signed)
Rapid Response Event Note  Overview: Time Called: 1551 Arrival Time: 1553 Event Type: Respiratory   Patient in Endo for colonoscopy.  Per staff she desated during the procedure and treated her for aspiration.  Post procedure she went to radiology for a CT scan.  Her O2 sats were 85-89% on 6L [place]  Initial Focused Assessment: I met the patient in radiology after CT done.  O2 sats dropped to 66% on [place].  Placed patient on NRB O2 sats 83-85%.  She complains of sever pain in her abdomen and is very tense.  She does endorse some shortness of breath.  Her main complaint is her sever pain. She is pale and has some mottling of her knees Lung sounds with scattered crackels     Interventions: Transported back to Endo department. Placed on NRB and 6L [place].  O2 sats 73% BP 123/50  ST 130s  RR 36 Head of bed elevated.  Daughter at bedside Dr Lamonte Sakai and Loree Fee NP at bedside to assess patient.  They spoke with patient and daughter with the interpreter.  Decision was made to keep the patient comfortable.  110mg Fentanyl given IV  Transported to 6N  1 mg Morphine given IV 2 mg Ativan given IV  Daughter and Son in law at bedside   Plan of Care (if not transferred): Full Comfort Measures  Event Summary:   at      at    Outcome: Code status clarified  Event End Time: 1Ewing LRaliegh Ip

## 2019-08-02 NOTE — Plan of Care (Signed)
  Problem: Activity: Goal: Risk for activity intolerance will decrease 2019/08/11 0840 by Laurence Slate, RN Outcome: Progressing   Problem: Nutrition: Goal: Adequate nutrition will be maintained 08-11-19 0840 by Laurence Slate, RN Outcome: Not Progressing 08/11/2019 0840 by Laurence Slate, RN Outcome: Not Progressing Note: Pt continues to report nausea. PRN nausea meds given.    Problem: Elimination: Goal: Will not experience complications related to bowel motility Outcome: Not Progressing   Problem: Pain Managment: Goal: General experience of comfort will improve 08-11-19 0840 by Laurence Slate, RN Outcome: Not Progressing 08/11/2019 0840 by Laurence Slate, RN Outcome: Progressing

## 2019-08-02 NOTE — Progress Notes (Signed)
Pt transferred back to 6N10.  Report given at bedside.  Primary RN, Rapid Response, and MD and family at bedside.

## 2019-08-02 NOTE — Consult Note (Signed)
NAME:  Allison Rojas, MRN:  [id number], DOB:  [birth year], LOS: 7 ADMISSION DATE:  07/22/2019, CONSULTATION DATE:  2019/08/22 REFERRING MD:  Dr. Oswaldo Done, CHIEF COMPLAINT: Acute respiratory distress  Brief History   84 year old female presented with bowel obstruction unfortunately suffered aspiration event at second colonoscopy resulting in significant respiratory distress.  PCCM consulted for assistance in management.  History of present illness   84 year old female of [place] descent, non-English-speaking originally presented with 1 week history of abdominal pain.  She was diagnosed with partial obstruction versus ileus.  Underwent colonoscopy 4/14 but due to poor prep patient required second colonoscopy which was performed 4/16.  During second colonoscopy procedure patient aspirated with significant respiratory distress and abdominal pain post procedure.  CT abdomen negative for perforation.    PCCM consulted for assistance in management acute deterioration.  On arrival to bedside patient was seen in acute respiratory distress and significant discomfort.  Oxygen saturation ranging in the mid 70s on 100% nonrebreather, patient was also seen hypotensive, tachypneic and tachycardic.  Reported by bedside nurse patient has an active DO NOT RESUSCITATE order at this time goals of care discussion was held with daughter with use of interpreter and decision was made to pursue comfort measures only and forego any further aggressive measures given patient's significant respiratory distress and discomfort.  Patient was administered opioids for pain control and transferred back to medical surgical floor for further comfort care measures   Past Medical History   Past Medical History:  Diagnosis Date  . Allergy   . Chronic low back pain   . Compression fracture    T9 and T12 noted 05/2012 imaging. Followed by pain clinic   . Fall    Jan or Feb 2014   . Gastritis   . GERD (gastroesophageal reflux disease)   .  Insomnia   . Irregular heart rate   . Osteoporosis   . Peptic ulcer disease   . Rib pain   . Right wrist fracture    3 - 4 monthes ago    Significant Hospital Events   Admitted 07/25/2019  Consults:  GI  PCCM  Procedures:  Colonoscopy 07/24/2019 Colonoscopy 22-Aug-2019   Significant Diagnostic Tests:  CT abdomen pelvis 07/20/2019 > 1. Dilated fluid-filled distal small bowel and terminal ileum with stool and fluid distension of the cecum and ascending colon, potentially related to ileus or enteritis with partial obstruction also included in the differential but felt less likely; there is no obvious obstructing colon mass. 2. Generous appearing appendix in terms of size but no other features to suggest acute inflammatory process 3. Moderate hiatal hernia  CT Chest 07/27/2019 > 1.  No suspicious right lung nodule.  2. Biapical pleural thickening with mild nodularity to the right apical pleural thickening. Patchy pleural thickening over the lateral upper lungs right worse than left. Focal density over the lateral right upper lung abutting the pleura and major fissure likely atelectasis/scarring. Recommend a follow-up CT chest 3 months to document stability.  3. Patchy interstitial changes and bronchiectasis. Possible small amount of aspirate material over the right-side of the trachea just above the carina.  4.  Stable changes as described over the upper abdomen.  5.  Aortic Atherosclerosis (ICD10-I70.0).  6.  Stable spinal compression fractures.  Micro Data:  COVID 07/22/2019 > negative  Antimicrobials:  Unasyn 4/16  Interim history/subjective:  Sitting up in bed in acute respiratory distress and significant abdominal pain. Daughter at bedside and updated extensively   Objective  Blood pressure (!) 101/52, pulse (!) 138, temperature 98.8 F (37.1 C), temperature source Axillary, resp. rate (!) 36, height 5' (1.524 m), weight 40.4 kg, SpO2 (!) 74 %.         Intake/Output Summary (Last 24 hours) at 08/11/2019 1643 Last data filed at 08/01/2019 1250 Gross per 24 hour  Intake 1880.1 ml  Output 450 ml  Net 1430.1 ml   Filed Weights   07/27/19 0617 08/21/19 1324 07/29/2019 1120  Weight: 40.4 kg 40.4 kg 40.4 kg    Examination: General: Chronically ill appearing elderly female lying in bed in acute respiratory distress  HEENT: ETT, MM pink/moist, PERRL, very pale in appearance  Neuro: Alert and responsive to daughter CV: s1s2 regular rate and rhythm, no murmur, rubs, or gallops,  PULM:  Bilaterally rhonchi with accessory muscle use and tachypnea   GI: soft, bowel sounds active in all 4 quadrants, tender, distended,  Extremities: warm/dry, no edema  Skin: no rashes or lesions   Resolved Hospital Problem list     Assessment & Plan:  84 year old female presented with bowel obstruction unfortunately suffered aspiration event at second colonoscopy resulting in significant respiratory distress.   Partial bowel obstruction Acute respiratory distress likely secondary to aspiration pneumonia Iron deficiency anemia Concern for pulmonary malignancy Primary hyperparathyroidism Insomnia GERD/Barrett's esophagus  Plan PCCM consulted for assistance in management acute deterioration.  On arrival to bedside patient was seen in acute respiratory distress and significant discomfort.  Oxygen saturation ranging in the mid 70s on 100% nonrebreather, patient was also seen hypotensive, tachypneic and tachycardic.  Reported by bedside nurse patient has an active DO NOT RESUSCITATE order. At time of bedside assessment goals of care discussion was held with daughter with help of  interpreter and decision was made to pursue comfort measures only and forego any further aggressive measures given patient's significant respiratory distress and discomfort.   -Comfort care measures only -Morphine drip -Benzodiazepines as needed for anxiety -Unrestricted family visitation  -Discontinue all unnecessary equipment and lab work  [employer] practice:  Diet: N.p.o. Pain/Anxiety/Delirium protocol (if indicated): Morphine drip VAP protocol (if indicated): N/A DVT prophylaxis: SCDs GI prophylaxis: N/A Glucose control: N/A Mobility: Bedrest Code Status: DNR Family Communication: Updated extensively at bedside Disposition: Palliative floor  Labs   CBC: Recent Labs  Lab 07/29/19 0327 07/30/19 0237 Aug 21, 2019 0354 08/01/19 0355 07/18/2019 0220  WBC 5.5 7.8 7.7 9.3 8.5  HGB 8.2* 8.6* 8.2* 7.9* 8.3*  HCT 25.5* 27.2* 25.0* 24.2* 24.5*  MCV 96.6 96.1 92.6 91.7 91.4  PLT 290 317 341 381 409    Basic Metabolic Panel: Recent Labs  Lab 07/27/19 0344 07/28/19 0808 07/29/19 0327 07/30/19 0237 2019/08/21 0354 08/01/19 0355 07/27/2019 0220  NA 136   < > 135 131* 134* 135 131*  K 4.6   < > 4.3 4.5 4.0 4.0 3.8  CL 108   < > 106 103 102 104 101  CO2 16*   < > 18* 13* 20* 20* 21*  GLUCOSE 101*   < > 64* 83 120* 133* 171*  BUN 21   < > 16 22 17 15 15   CREATININE 0.89   < > 0.87 0.92 0.77 0.80 0.63  CALCIUM 6.2*   < > 6.9* 7.0* 7.0* 6.5* 7.1*  MG 2.1  --   --   --   --   --   --   PHOS  --   --  1.9*  --   --  1.5* 1.3*   < > =  values in this interval not displayed.   GFR: Estimated Creatinine Clearance: 35.2 mL/min (by C-G formula based on SCr of 0.63 mg/dL). Recent Labs  Lab 2019/08/13 1804 07/27/19 0344 07/30/19 0237 08/12/2019 0354 08/01/19 0355 08/16/2019 0220  WBC  --    < > 7.8 7.7 9.3 8.5  LATICACIDVEN 1.1  --   --   --   --   --    < > = values in this interval not displayed.    Liver Function Tests: Recent Labs  Lab 07/27/19 0344 08/07/2019 0220  AST 22 20  ALT 16 17  ALKPHOS 45 52  BILITOT 0.7 0.4  PROT 5.3* 4.4*  ALBUMIN 2.6* 2.0*   No results for input(s): LIPASE, AMYLASE in the last 168 hours. No results for input(s): AMMONIA in the last 168 hours.  ABG No results found for: PHART, PCO2ART, PO2ART, HCO3, TCO2, ACIDBASEDEF, O2SAT    Coagulation Profile: No results for input(s): INR, PROTIME in the last 168 hours.  Cardiac Enzymes: No results for input(s): CKTOTAL, CKMB, CKMBINDEX, TROPONINI in the last 168 hours.  HbA1C: Hemoglobin A1C  Date/Time Value Ref Range Status  06/04/2018 04:17 PM 5.2 4.0 - 5.6 % Final    CBG: No results for input(s): GLUCAP in the last 168 hours.  Review of Systems:   Unable to obtain secondary to acute deterioration  Past Medical History  She,  has a past medical history of Allergy, Chronic low back pain, Compression fracture, Fall, Gastritis, GERD (gastroesophageal reflux disease), Insomnia, Irregular heart rate, Osteoporosis, Peptic ulcer disease, Rib pain, and Right wrist fracture.   Surgical History    Past Surgical History:  Procedure Laterality Date  . COLONOSCOPY N/A 07/23/2019   Procedure: COLONOSCOPY;  Surgeon: Mansouraty, Netty Starring., MD;  Location: [facility] ENDOSCOPY;  Service: Gastroenterology;  Laterality: N/A;  . NO PAST SURGERIES       Social History   reports that she has never smoked. She has never used smokeless tobacco. She reports that she does not drink alcohol or use drugs.   Family History   Her family history is negative for Colon cancer, Esophageal cancer, Rectal cancer, and Stomach cancer.   Allergies Allergies  Allergen Reactions  . Amitriptyline Other (See Comments)    Patient is still taking this in 2021, since 02/2019 (??) Family is uncertain about this entry (??)     Home Medications  Prior to Admission medications   Medication Sig Start Date End Date Taking? Authorizing Provider  acetaminophen (TYLENOL) 500 MG tablet Take 1 tablet (500 mg total) by mouth 3 (three) times daily. Patient taking differently: Take 1,000 mg by mouth 3 (three) times daily as needed (for dental pain).  07/24/13  Yes Bobbye Charleston, MD  amitriptyline (ELAVIL) 50 MG tablet Take 1 tablet (50 mg total) by mouth at bedtime. 02/20/19  Yes [name], MD   famotidine (PEPCID) 20 MG tablet TAKE ONE TABLET BY MOUTH TWO TIMES A DAY AS NEEDED FOR HEARTBURN OR INDIGESTION Patient taking differently: Take 20 mg by mouth at bedtime.  02/20/19  Yes [name], MD  fluticasone (FLONASE) 50 MCG/ACT nasal spray Place 2 sprays into both nostrils daily as needed for rhinitis. For allergies Patient taking differently: Place 2 sprays into both nostrils daily as needed for allergies or rhinitis.  02/20/19  Yes [name], MD  levocetirizine (XYZAL) 5 MG tablet Take 5 mg by mouth daily as needed for allergies.   Yes [provider]  omeprazole (PRILOSEC) 40  MG capsule Take medicine 30 minutes before breakfast Patient taking differently: Take 40 mg by mouth daily before breakfast. Take medicine 30 minutes before breakfast 07/22/19  Yes Rachael Fee, MD  PROLIA 60 MG/ML SOSY injection Inject 60 mg into the skin every 6 (six) months. 05/10/19  Yes [name], MD  traMADol (ULTRAM) 50 MG tablet Take 1 tablet (50 mg total) by mouth 2 (two) times daily as needed for moderate pain or severe pain. Patient taking differently: Take 50 mg by mouth in the morning and at bedtime.  06/12/19  Yes [name], MD  diclofenac sodium (VOLTAREN) 1 % GEL Apply 4 g topically 4 (four) times daily. Patient not taking: Reported on 08/14/2019 11/12/15   Lora Paula, MD  FEROSUL 325 (65 Fe) MG tablet TAKE ONE TABLET BY MOUTH EVERY MORNING WITH BREAKFAST Patient taking differently: Take 325 mg by mouth daily with breakfast.  07/09/19   Burns Spain, MD     Signature:    Delfin Gant, NP-C [facility] Contact / Pager information can be found on Amion  08/03/2019, 5:20 PM

## 2019-08-02 NOTE — Progress Notes (Signed)
This RN along with two other endo RNs, Seward Grater and Annie Sable, took this pt to CT scan on tele monitor. Prior to CT pt was on 6L [place] at 85-89%. While in CT pt desated to 66%. Maggie, RN called Rapid, Council Mechanic, RN, and Rapid informed us to place NRB mask on pt. NRB mask placed. Rapid and respiratory arrived to CT and helped transport back to Endo unit. On transport back to room the pt saturation elevated to 85-87%. Currently in room, pt on NRB mask with saturation at 76%. Pt alert but hard to get information out of due to different language. [place] interpreter on green ipad at bedside throughout entire event. Pt moving around in bed, acute abdominal pain, pt states she is having "a little trouble breathing." Dr. Delton Coombes at bedside. Pt daughter at bedside.

## 2019-08-02 NOTE — Progress Notes (Signed)
The chaplain visited as a result of a consult from the day chaplain. The chaplain was present for the family as they grieved. The chaplain is available if needed.  Lavone Neri Chaplain Resident For questions concerning this note please contact me by pager [phone]

## 2019-08-02 NOTE — Significant Event (Addendum)
Patient seen at the bedside after being paged that she had aspirated during her colonoscopy procedure.  The patient is currently on 6 L nasal cannula supplemental O2 with ranges of 91 to 100% O2 saturations. Patient was tachycardic to the mid 120s as well.  I spoke to the patient and her daughter who was at the bedside.  The patient's granddaughter was also present via telephone.  I explained how the patient aspirated during the procedure, so now we are starting her on antibiotics to treat presumed aspiration pneumonia.  The chest x-ray demonstrated bilateral lower pulmonary infiltrates.  KUB demonstrated air-fluid level levels and a dilated bowel loops.  On exam, the patient had diminished breath sounds at the bases, but was otherwise clear.  Normal S1-S2, no murmurs rubs or gallops.  Abdomen mildly tender in all quadrants.  Plan: -Transfer patient to progressive for new O2 requirement. Supplemental O2 as need. currently on 6L [place] -Stat CT abdomen/pelvis -Continue Unasyn -In light of this complication, the patient will likely need TPN as a means of nutrition she has not eaten in 2 weeks.

## 2019-08-02 NOTE — Progress Notes (Signed)
Nutrition Follow-up  DOCUMENTATION CODES:   Underweight  INTERVENTION:   -Continue Boost Breeze po BID, each supplement provides 250 kcal and 9 grams of protein -Continue MVI with minerals daily -RD will follow for diet advancement and adjust supplement regimen as appropriate -Pt has been without adequate nutrition for 7 days. If prolonged NPO/clear liquid diet status is anticipated, consider initiation of nutrition support (ex TPN)  NUTRITION DIAGNOSIS:   Inadequate oral intake related to altered GI function as evidenced by per patient/family report.  Ongoing  GOAL:   Patient will meet greater than or equal to 90% of their needs  Unmet  MONITOR:   PO intake, Supplement acceptance, Diet advancement, Labs, Weight trends, Skin, I & O's  REASON FOR ASSESSMENT:   Consult Assessment of nutrition requirement/status  ASSESSMENT:   Allison Rojas is a pleasant 84 y/o [place] woman with PMHx significant for chronic back pain on Tramadol, iron deficiency anemia, vitamin D deficiency, Osteoporosis, Gastritis and Barrett's esophagus who presented to the ED with one week of abdominal pain.  4/14- s/p colonoscopy- revealed hemorrhoids and extensive amounts of stool in rectum and colon  Reviewed I/O's: +2.3 L x 24 hours and +9.3 L since admission  UOP: 400 ml x 24 hours  Attempted to speak with pt via phone, however, no answer.   Per GI notes, plan for another colonoscopy today and possible repeat CT of abdomen and pelvis. GI recommending SMOG enema, but pt is reluctant to take.   Pt with minimal acceptance of clear liquid diet (noted meal completion 0% yesterday). She is refusing Boost Breeze supplements. Additionally, pt has received inadequate nutrition within the past 7 days, due to restrictions of NPO and clear liquid diet. If unable to advance diet, may need to consider initiation of nutrition support.   Labs reviewed: Na: 131. Phos: 1.3.   Diet Order:   Diet Order           Diet clear liquid Room service appropriate? Yes; Fluid consistency: Thin  Diet effective now              EDUCATION NEEDS:   Not appropriate for education at this time  Skin:  Skin Assessment: Reviewed RN Assessment  Last BM:  08/01/19  Height:   Ht Readings from Last 1 Encounters:  30-Aug-2019 5' (1.524 m)    Weight:   Wt Readings from Last 1 Encounters:  2019/08/30 40.4 kg    Ideal Body Weight:  45.5 kg  BMI:  Body mass index is 17.39 kg/m.  Estimated Nutritional Needs:   Kcal:  1200-1400  Protein:  55-70 grams  Fluid:  > 1.2 L    Levada Schilling, RD, LDN, CDCES Registered Dietitian II Certified Diabetes Care and Education Specialist Please refer to [facility] for RD and/or RD on-call/weekend/after hours pager

## 2019-08-02 NOTE — Anesthesia Procedure Notes (Signed)
Procedure Name: MAC Date/Time: 08/06/2019 12:03 PM Performed by: Kyung Rudd, CRNA Pre-anesthesia Checklist: Patient identified, Emergency Drugs available, Suction available and Patient being monitored Patient Re-evaluated:Patient Re-evaluated prior to induction Oxygen Delivery Method: Simple face mask Induction Type: IV induction Placement Confirmation: positive ETCO2 Dental Injury: Teeth and Oropharynx as per pre-operative assessment

## 2019-08-02 NOTE — Progress Notes (Signed)
Upon transfer to Endo, pt noted to be tachpneic, breathing labored and with accessory muscle use.  MD Desmond Lope notified.  No new orders at this time.  Okay to proceed with colonoscopy.

## 2019-08-02 NOTE — Progress Notes (Signed)
Call placed to Dr. Desmond Lope, advised patient had miralax at 1030 with 4-6oz of water. Per Dr. Desmond Lope, ok to proceed with colonoscopy at this time.

## 2019-08-02 NOTE — Interval H&P Note (Signed)
History and Physical Interval Note:  08/03/2019 11:43 AM  Allison Rojas  has presented today for surgery, with the diagnosis of Obstipation, abnormal CT, partial large bowel obstruction, constipation.  The various methods of treatment have been discussed with the patient and family. After consideration of risks, benefits and other options for treatment, the patient has consented to  Procedure(s): COLONOSCOPY (N/A) as a surgical intervention.  The patient's history has been reviewed, patient examined, no change in status, stable for surgery.  I have reviewed the patient's chart and labs.  Questions were answered to the patient's satisfaction.     [employer]

## 2019-08-02 NOTE — Progress Notes (Signed)
Subjective:   Pt was seen at the bedside today.  We discussed how that GI doctors will perform another colonoscopy today to see if they can find the source of the obstruction.  Patient and daughter is in agreement with the plan.   Interpreter services used.  Objective: CBC Latest Ref Rng & Units 07/20/2019 08/01/2019 2019/08/08  WBC 4.0 - 10.5 K/uL 8.5 9.3 7.7  Hemoglobin 12.0 - 15.0 g/dL 8.3(L) 7.9(L) 8.2(L)  Hematocrit 36.0 - 46.0 % 24.5(L) 24.2(L) 25.0(L)  Platelets 150 - 400 K/uL 381 381 341   BMP Latest Ref Rng & Units 07/29/2019 08/01/2019 August 08, 2019  Glucose 70 - 99 mg/dL 171(H) 133(H) 120(H)  BUN 8 - 23 mg/dL 15 15 17   Creatinine 0.44 - 1.00 mg/dL 0.63 0.80 0.77  BUN/Creat Ratio 12 - 28 - - -  Sodium 135 - 145 mmol/L 131(L) 135 134(L)  Potassium 3.5 - 5.1 mmol/L 3.8 4.0 4.0  Chloride 98 - 111 mmol/L 101 104 102  CO2 22 - 32 mmol/L 21(L) 20(L) 20(L)  Calcium 8.9 - 10.3 mg/dL 7.1(L) 6.5(L) 7.0(L)   Vital signs in last 24 hours: Vitals:   08/01/19 1015 08/01/19 1359 08/01/19 2100 07/27/2019 0635  BP: 132/62 (!) 144/83 138/77 104/67  Pulse: 99 84 (!) 108 (!) 109  Resp: 18 18 18 18   Temp: 98.6 F (37 C) 98.8 F (37.1 C) 98.4 F (36.9 C) 98.2 F (36.8 C)  TempSrc: Oral Oral Oral   SpO2: 92% 92% 93% (!) 86%  Weight:      Height:       Physical Exam General: Resting in bed HEENT: NCAT CV: RRR, no MRG, 2+ edema in ankles PULM: There are in all lung fields bilaterally ABD: Full mildly distended, mildly tender in all quadrants NEURO: Alert and oriented  Assessment/Plan:  Principal Problem:   Partial intestinal obstruction (HCC) Active Problems:   Ileus (HCC)   Constipation   Lung mass  In summary, Allison Rojas is an 84 y.o female with a hx of chronic back pain on Tramadol, iron deficiency anemia, vitamin D deficiency, osteoporosis, gastritis and Barrett's esophagus who presented with a progressive increase of abdominal pain. Imaging findings consistent with partial bowel  obstruction.   #Partial small bowel obstruction: CT abdomen on admission showed a dilated fluid filled small bowel and terminal ileum with significant stool burden in the ascending colon concerning for partial obstruction.   Patient is tolerating clear liquids at this time.  GI is invovled and we appreciate recommendations. Currently holding home tramadol. -Appreciate GI recommendations  -Concern for partial obstruction and ongoing iron deficiency may suggest malignancy.  Colonoscopy on 4/14 demonstrated large stool burden. Pt was able to tolerate an enema and had a small BM yesterday. Colonoscopy will be repeated today  IV Dilaudid 0.5 mg q6hrs PRN. Will try to avoid more opiates.   #LE edema: Progressively developed over the last several days. Likely secondary to continuous IVF.  -D/c fluids today.  #Iron Deficiency Anemia: Patient takes oral iron at home, but in the setting of bowel obstruction may make constipation worse. This has been discontinued.  Hemoglobin stable. Iron studies demonstrate low iron levels. S/p feraheme x1.  #Pulmonary Thickening: Incidental finding of pulmonary mass on chest x-ray. Concerning for malignancy but CT chest demonstrated pleural thickening, and no mass. -F/u CT in 3 months recommended   #Primary Hyperparathyroidism: PTH elevated for 207. Ionized calcium levels were unable to be obtained. Pt has a longstanding hx of vitamin d deficiency  as well.  -Replete Ca PRN  #Insomnia -Ramelteon nightly PRN  #GERD/Gastritis/Barret's Esophagus #FEN/GI -Diet: Clear liquids -Fluids: None -IV Protonix 40 mg daily -Zofran 4 mg q6hrs PRN -Consult to dietitian, appreciate recs  #DVT prophylaxis -SCDs  #CODE STATUS: DNR  #Dispo:  Prior to Admission Living Arrangement: Home Anticipated Discharge Location: Home Barriers to Discharge: Ongoing medical workup  Allison Boys, MD Internal Medicine, PGY1 Pager: [phone]  08/13/2019,3:29 PM

## 2019-08-02 NOTE — Progress Notes (Signed)
Please see RRT note for full details. Patient became hypoxemic and CT scan. CT scan completed. CT scan reviewed after patient returned to the endoscopy unit after RRT. CT scan shows no evidence of perforation. Patient's clinical status decompensating. She is on nonrebreather. She has been evaluated by the rapid response team/critical care service. Based on current discussions it seems like decision has been made to move forward with comfort being the goal at this time. She remains on a nonrebreather. She will be transferred back to 6 N. I spoke with the patient's daughter and granddaughter about the results of the imaging findings. I appreciate the medical service and critical care service and rapid response team as well as my GI team for all the care that they have provided for the patient this afternoon.

## 2019-08-02 NOTE — Op Note (Signed)
[facility] Patient Name: Allison Rojas Procedure Date : 07/18/2019 MRN: [id number] Attending MD: Justice Britain , MD Date of Birth: 1936/02/12 CSN: [id number] Age: 84 Admit Type: Inpatient Procedure:                Colonoscopy Indications:              Generalized abdominal pain, Previously noted                            partial LBO, Improved KUB most recently with                            decrease air fluid, recent Colonscopy attempt with                            significant Hard Formed Stool Burden Providers:                Justice Britain, MD, Carlyn Reichert, RN, Doristine Johns, RN, Lazaro Arms, Technician Referring MD:             Milus Banister, MD, Internal Medicine Service Medicines:                Monitored Anesthesia Care Complications:            Possible Aspiration Estimated Blood Loss:     Estimated blood loss: none. Procedure:                Pre-Anesthesia Assessment:                           - Prior to the procedure, a History and Physical                            was performed, and patient medications and                            allergies were reviewed. The patient's tolerance of                            previous anesthesia was also reviewed. The risks                            and benefits of the procedure and the sedation                            options and risks were discussed with the patient.                            All questions were answered, and informed consent                            was obtained. Prior Anticoagulants: The patient has  taken no previous anticoagulant or antiplatelet                            agents. ASA Grade Assessment: III - A patient with                            severe systemic disease. After reviewing the risks                            and benefits, the patient was deemed in                            satisfactory condition to undergo  the procedure.                           After obtaining informed consent, the colonoscope                            was passed under direct vision. Throughout the                            procedure, the patient's blood pressure, pulse, and                            oxygen saturations were monitored continuously. The                            PCF-H190DL ([id number]) Olympus pediatric colonoscope                            was introduced through the anus with the intention                            of advancing to the cecum. The scope was advanced                            to the ascending colon before the procedure was                            aborted. Medications were given. The colonoscopy                            was technically difficult and complex due to                            inadequate bowel prep. I used a complete water                            effusion technique while passing the scope through                            the colon. After notation of the significant  dilation of the colon, without apparent stricture,                            and patient respiratory status, I elected to abor                            the rest of the procedure. The procedure was aided                            by performing the maneuvers documented (below) in                            this report. The patient tolerated the procedure                            poorly due to the patient's respiratory instability                            (pulmonary aspiration). The quality of the bowel                            preparation was unsatisfactory. Scope In: 12:14:06 PM Scope Out: 12:38:12 PM Total Procedure Duration: 0 hours 24 minutes 5 seconds  Findings:      The digital rectal exam findings include hemorrhoids. Pertinent       negatives include no palpable rectal lesions.      Non-bleeding non-thrombosed external and internal hemorrhoids were found        during perianal exam and during digital exam. The hemorrhoids were Grade       III (internal hemorrhoids that prolapse but require manual reduction).      A localized area of mildly erythematous mucosa was found in the rectum -       likely from recent Enema.      Extensive amounts of semi-liquid and semi-solid and solid stool was       found in the recto-sigmoid colon, in the sigmoid colon, in the       descending colon, at the splenic flexure, in the transverse colon and at       the hepatic flexure, interfering with visualization. Lavage of the area       was performed using copious amounts, resulting in incomplete clearance       with continued poor visualization but allowed the passage of the scope       to the noted region below.      The lumen of the transverse colon and hepatic flexure was grossly       dilated. Stool was adherent to this region. There was no apparent       stricture present as the Pediatric colonoscope could traverse into this       region but it did pass significant hard stool in the region distal to       here along-side the stool. Suction via Endoscope was performed to       decompress the colon. As we got to this region, the patient had some       mild spit up into her mouth and had slight decrease in her O2       saturations.  Decision may to immediately place a colonic decompression       tube. A wire was placed into the colon and then the scope was withdrawn.       Patient sedation was stopped. I then placed, without fluoroscopy a       decompression tube over the wire. This was taped to her leg. She had       subesequent passage of some of the previously noted formed stool through       the rectum and some air decompression through the tube. Impression:               - Preparation of the colon was unsatisfactory. Even                            with a few more days of bowel preparation and                            enemas.                           -  Hemorrhoids found on digital rectal exam.                           - This colonoscopy was performed under water                            immersion technique without significant air                            placement into the colon.                           - Non-bleeding non-thrombosed external and internal                            hemorrhoids.                           - Erythematous mucosa in the rectum - from recent                            enema.                           - Stool in the recto-sigmoid colon, in the sigmoid                            colon, in the descending colon, at the splenic                            flexure, in the transverse colon and at the hepatic                            flexure. Lavaged without adequate visualization for  colorectal cancer screening.                           - Dilated in the Presumed Transverse colon and at                            the Hepatic flexure was noted. Stool completely                            covered the region. Patient had mild possible                            aspiration at this point and as she is a DNR we                            needed to complete procedure quickly. A                            decompression tube was placed in effort of                            decompressing the bowel.                           - It is not clear that there is a mass/lesion                            causing this particular dilation of the colon.                            Query an Institutionalized Bowel vs Ogilvie's Bowel. Recommendation:           - The patient will be observed post-procedure,                            until all discharge criteria are met.                           - Recommend CXR to evaluate for potential                            aspiration pneumonitis vs aspiration changes.                           - Recommend KUB to evaluate placement of colonic                             decompression tube.                           - Recommend a repeat CTAP with IV contrast and PO                            contrast as able based on clinical stability in  next 24 hours.                           - Maintain K >4.0 and Mg >2.0.                           - I am ordering a single dose of IV Unasyn, but                            will discuss with primary service their decision                            tree about potential antibiotics for aspiration                            pneumonitis vs pneumonia prevention.                           - Leave colonic decompression tube to gravity. If                            it falls out, it does not need to be replaced.                           - Not sure if this is Ogilvie's                            Bowel/Institutionalized Bowel or true obstruction,                            but if issues arise will need to discuss potential                            surgical options.                           - Would not plan to try and repeat Colonoscopy at                            this time or bowel preparation due to her                            respiratory status.                           - The findings and recommendations were discussed                            with the patient.                           - The findings and recommendations were discussed                            with the patient's family.                           -  The findings and recommendations were discussed                            with the referring physician. Procedure Code(s):        --- Professional ---                           [phone], 51, Colonoscopy, flexible; with                            decompression (for pathologic distention) (eg,                            volvulus, megacolon), including placement of                            decompression tube, when performed Diagnosis Code(s):        --- Professional ---                            K64.2, Third degree hemorrhoids                           K62.89, Other specified diseases of anus and rectum                           K59.39, Other megacolon                           R10.84, Generalized abdominal pain CPT copyright 2019 American Medical Association. All rights reserved. The codes documented in this report are preliminary and upon coder review may  be revised to meet current compliance requirements. Justice Britain, MD 08/01/2019 1:15:16 PM Number of Addenda: 0

## 2019-08-02 NOTE — Transfer of Care (Signed)
Immediate Anesthesia Transfer of Care Note  Patient: Allison Rojas  Procedure(s) Performed: COLONOSCOPY (N/A ) BOWEL DECOMPRESSION (N/A )  Patient Location: Endoscopy Unit  Anesthesia Type:MAC  Level of Consciousness: awake and alert   Airway & Oxygen Therapy: Patient Spontanous Breathing and Patient connected to face mask oxygen  Post-op Assessment: Report given to RN, Post -op Vital signs reviewed and stable and Patient moving all extremities X 4  Post vital signs: Reviewed and stable  Last Vitals:  Vitals Value Taken Time  BP    Temp    Pulse 99 07/23/2019 1255  Resp 26 08/01/2019 1255  SpO2 100 % 07/23/2019 1255  Vitals shown include unvalidated device data.  Last Pain:  Vitals:   08/09/2019 1255  TempSrc: (P) Axillary  PainSc:       Patients Stated Pain Goal: 3 (22/29/79 8921)  Complications: No apparent anesthesia complications

## 2019-08-02 NOTE — Progress Notes (Signed)
Alphonzo Lemmings, NP at bedside speaking with pt and daughter with [place] interpreter ipad.

## 2019-08-02 NOTE — Progress Notes (Signed)
Brief GI progress note  Postprocedure, patient had persistent tachycardia as well as hypoxemia.  Concern for potential previous aspiration or while during the procedure she had some small emesis occur at which point we decided to complete colonoscopy as quickly as possible and place a decompression tube. Patient has had breathing treatments and has been transitioned back to a nasal cannula with O2 sats above 90%.  She is on 6 L of O2. Exam consistent with potential decreased breath sounds at the bases bilaterally. Chest x-ray obtained and read suggestive of bibasilar pleural effusions and potential left lower lobe infiltrate versus atelectasis that is new compared to prior CT scan. I had ordered IV Unasyn because of concern for potential aspiration and she has received that during her recovery. Abdomen now having more significant abdominal tenderness in the right upper quadrant right lower quadrant region which she describes as 8-10 out of 10 pain. KUB reviewed by myself and then personally with radiology with final reads to be placed. X-rays show placement of decompression tube and with concern for potential free air. Patient looks to be very uncomfortable. Gave 12.5 mg fentanyl IV x1 dose. Called CT scan and she is going to get a new IV at which point she will had to CT scan. Stat CT abdomen/pelvis with IV contrast if possible if cannot get IV then can be done without contrast. Updated the patient's daughter throughout the course as well as the patient's granddaughter. High degree of concern for patient's clinical status at this point. It is not clear if the patient's and the patient's daughter and the patient's granddaughter would want aggressive treatment such as surgery but are not opposed to hearing all options if necessary.  Corliss Parish, MD [facility] Advanced Endoscopy Office # [phone]

## 2019-08-02 NOTE — Progress Notes (Signed)
Post procedure, pt needing additional oxygen support.  Pt SpO2 85-95% on nasal cannula. MD Desmond Lope notified and ordered albuterol tx.   Education provided to pt and daughter at bedside.    Pt also complaining of 9/10 pain in her right rib cage, MD Mansouraty notified and CXR and abdominal X-ray ordered.  Fentanyl and Zofran ordered for pt comfort.  Primary care team notified and new orders placed to transfer to higher level of care.

## 2019-08-02 NOTE — Anesthesia Preprocedure Evaluation (Addendum)
Anesthesia Evaluation  Patient identified by MRN, date of birth, ID band Patient awake    Reviewed: Allergy & Precautions, NPO status , Patient's Chart, lab work & pertinent test results  Airway Mallampati: II  TM Distance: >3 FB Neck ROM: Full    Dental  (+) Dental Advisory Given, Loose, Missing, Poor Dentition   Pulmonary neg pulmonary ROS,     + decreased breath sounds+ wheezing      Cardiovascular negative cardio ROS   Rhythm:Irregular Rate:Normal     Neuro/Psych negative neurological ROS     GI/Hepatic Neg liver ROS, PUD, GERD  Medicated,  Endo/Other  Primary Hyperparathyroidism  Renal/GU Renal InsufficiencyRenal disease     Musculoskeletal negative musculoskeletal ROS (+)   Abdominal   Peds  Hematology  (+) Blood dyscrasia, anemia ,   Anesthesia Other Findings Day of surgery medications reviewed with the patient.  Reproductive/Obstetrics                            Anesthesia Physical Anesthesia Plan  ASA: III  Anesthesia Plan: MAC   Post-op Pain Management:    Induction: Intravenous  PONV Risk Score and Plan: 2 and Propofol infusion and Treatment may vary due to age or medical condition  Airway Management Planned: Nasal Cannula and Natural Airway  Additional Equipment:   Intra-op Plan:   Post-operative Plan:   Informed Consent: I have reviewed the patients History and Physical, chart, labs and discussed the procedure including the risks, benefits and alternatives for the proposed anesthesia with the patient or authorized representative who has indicated his/her understanding and acceptance.   Patient has DNR.  Discussed DNR with patient, Discussed DNR with power of attorney and Continue DNR.   Dental advisory given  Plan Discussed with: CRNA and Anesthesiologist  Anesthesia Plan Comments:         Anesthesia Quick Evaluation

## 2019-08-03 NOTE — Plan of Care (Signed)
  Problem: Education: Goal: Knowledge of General Education information will improve Description: Including pain rating scale, medication(s)/side effects and non-pharmacologic comfort measures Outcome: Completed/Met   Problem: Health Behavior/Discharge Planning: Goal: Ability to manage health-related needs will improve Outcome: Completed/Met   Problem: Clinical Measurements: Goal: Ability to maintain clinical measurements within normal limits will improve Outcome: Completed/Met   

## 2019-08-03 NOTE — Plan of Care (Signed)

## 2019-08-03 NOTE — Anesthesia Postprocedure Evaluation (Signed)
Anesthesia Post Note  Patient: Allison Rojas  Procedure(s) Performed: COLONOSCOPY (N/A ) BOWEL DECOMPRESSION (N/A )     Patient location during evaluation: PACU Anesthesia Type: MAC Level of consciousness: awake and alert Pain management: pain level controlled Vital Signs Assessment: vitals unstable Respiratory status: spontaneous breathing, nonlabored ventilation, respiratory function stable, patient connected to nasal cannula oxygen and respiratory function unstable Cardiovascular status: stable and blood pressure returned to baseline Postop Assessment: no apparent nausea or vomiting Anesthetic complications: no Comments: Patient with presumed aspiration during colonoscopy.  Procedure quickly ended and rectal tube placed prior to end of procedure.  Patient in PACU requiring 6L [place] O2 and complaining of abdominal pain. GI proceduralist and Medicine primary team at bedside discussing goals of care with patient and family via interpreter.    Last Vitals:  Vitals:   08/08/2019 1622 07/27/2019 1623  BP:  (!) 101/52  Pulse: (!) 138 (!) 138  Resp: (!) 37 (!) 36  Temp:    SpO2: (!) 72% (!) 74%    Last Pain:  Vitals:   08/16/2019 1627  TempSrc:   PainSc: 0-No pain                 Catalina Gravel

## 2019-08-03 NOTE — Progress Notes (Signed)
Progress Note for [facility]  The patient has an oxygen saturation at 74% with a Non-rebreather mask.  She is on a morphine drip.  She is on comfort care.

## 2019-08-03 NOTE — Progress Notes (Signed)
   Subjective: Allison Rojas was seen and evaluated at bedside. Her daugther and granddaugther are in the room.  Unfortunately her colonoscopy yesterday was complicated with aspiration and hypoxia. She started on antibiotic and supplemental oxygen. She decompensated and O2 sat declined to 70s-80s. She has been on non rebreather. (She is DNI). ICU team consulted immidiately and evaluated patient and discussed the situation with family. Family decided to avoid any invasive procedure or treatment and proceeded with comfort care.   Objective: Constitutional: Sedated, appears comfortable Cardiovascular: RRR, nl S1S2, no murmur, 1-2+ [place] Respiratory: No respiratory distress. No wheezes.  GI: Soft. No distension. Skin: Erythema on back without discharge Neurological: Is sedated   Assessment/Plan:  Principal Problem:   Partial intestinal obstruction (HCC) Active Problems:   Ileus (HCC)   Constipation   Lung mass   Aspiration into airway   Acute respiratory failure with hypoxemia [facility])  Allison Rojas is an 84 y.o female with a hx of chronic backpain on Tramadol, iron deficiency anemia,vitamin D deficiency,osteoporosis, gastritisandBarrett's esophagus who presented with a progressive increase of abdominal pain. Imaging findings consistent with partial bowel obstruction.  #Partial small bowel obstruction:  No response to S/p colonoscopy 4/16, unfortunately this complicated with aspiration and desaturation.  No perforation per repeat imaging. GI and PCCM have been on board. Patient has been on comfort care per family wishes. Will continue comfort care measure.    Prior to Admission Living Arrangement:  Barriers to Discharge: anticipating inhospital death Dispo: Anticipated discharge   Chevis Pretty, MD 08/03/2019, 9:04 AM Pager: @[phone] @

## 2019-08-05 ENCOUNTER — Encounter: Payer: Self-pay | Admitting: *Deleted

## 2019-08-16 DIAGNOSIS — R748 Abnormal levels of other serum enzymes: Secondary | ICD-10-CM | POA: Diagnosis present

## 2019-08-16 DIAGNOSIS — S32010S Wedge compression fracture of first lumbar vertebra, sequela: Secondary | ICD-10-CM

## 2019-08-16 DIAGNOSIS — M40204 Unspecified kyphosis, thoracic region: Secondary | ICD-10-CM | POA: Diagnosis present

## 2019-08-16 DIAGNOSIS — M48061 Spinal stenosis, lumbar region without neurogenic claudication: Secondary | ICD-10-CM | POA: Diagnosis present

## 2019-08-17 NOTE — Progress Notes (Signed)
Family left at bedside around [phone] with pt belongings with no questions or concerns, Post mortem care completed during shift change, report given to day shift nurse, attending doctors couldn't sign the death certificate for a specific reason, day shift nurse techs notified to send the body to the morgue. Post mortem assessment completed, will continue to monitor.

## 2019-08-17 NOTE — Death Summary Note (Signed)
  Name: Allison Rojas MRN: [id number] DOB: [birth year] 84 y.o.  Date of Admission: 07/28/2019 12:38 PM Date of Discharge: 08/12/19 Attending Physician: Erlinda Hong, MD  Discharge Diagnosis: Principal Problem:   Partial intestinal obstruction [facility]) Active Problems:   Ileus (HCC)   Constipation   Lung mass   Aspiration into airway   Acute respiratory failure with hypoxemia (HCC)  Cause of death: Bowel obstruction Time of death: 5 AM 08/12/19  Disposition and follow-up:   Allison Rojas was discharged from [facility] in expired condition.    Hospital Course:  In summary, Allison Rojas is an 84 y.o female with a hx of chronic backpain on Tramadol, iron deficiency anemia,vitamin D deficiency,osteoporosis, gastritisandBarrett's esophagus who presented with a progressive increase of abdominal pain. Imaging findings consistent with partial bowel obstruction.  Her obstruction was initially managed conservatively with multiple p.o. laxatives and enemas.  During this hospitalization, GI tried multiple colonoscopies to determine the cause of her obstruction.  Unfortunately, on the last colonoscopy the patient vomited, aspirated and subsequently had hypoxic respiratory failure.  The family decided to focus her care on comfort at that point.  The patient passed away with the family at the bedside.   Signed: Kirt Boys, MD Internal Medicine, PGY1 Pager: [phone]  08/05/2019,12:40 PM

## 2019-08-17 NOTE — Progress Notes (Signed)
Patient expired at 0500  this morning with family(daughter) at bedside, death confirmed by writer and another RN Alinda Deem at [phone], attending doctor notified , paged back , and came over to check patient and to see if family have  concerns or questions,  [place] donor notified, giving family privacy to grieve before we do body care and preparation, will continue to monitor .

## 2019-08-17 DEATH — deceased

## 2019-08-26 ENCOUNTER — Ambulatory Visit: Payer: Medicare Other | Admitting: Gastroenterology

## 2019-08-27 ENCOUNTER — Encounter: Payer: Medicare Other | Admitting: Internal Medicine
